# Patient Record
Sex: Female | Born: 1965 | Race: White | Hispanic: No | Marital: Married | Smoking: Former smoker
Health system: Southern US, Community
[De-identification: ages and names within clinical notes are randomized; demographics above are authoritative.]

## PROBLEM LIST (undated history)

## (undated) DIAGNOSIS — Z8679 Personal history of other diseases of the circulatory system: Secondary | ICD-10-CM

## (undated) DIAGNOSIS — M84376A Stress fracture, unspecified foot, initial encounter for fracture: Secondary | ICD-10-CM

## (undated) DIAGNOSIS — R519 Headache, unspecified: Secondary | ICD-10-CM

## (undated) DIAGNOSIS — R112 Nausea with vomiting, unspecified: Secondary | ICD-10-CM

## (undated) DIAGNOSIS — R0989 Other specified symptoms and signs involving the circulatory and respiratory systems: Secondary | ICD-10-CM

## (undated) DIAGNOSIS — R51 Headache: Secondary | ICD-10-CM

## (undated) DIAGNOSIS — K648 Other hemorrhoids: Secondary | ICD-10-CM

## (undated) DIAGNOSIS — J302 Other seasonal allergic rhinitis: Secondary | ICD-10-CM

## (undated) DIAGNOSIS — Z8619 Personal history of other infectious and parasitic diseases: Secondary | ICD-10-CM

## (undated) DIAGNOSIS — Z87448 Personal history of other diseases of urinary system: Secondary | ICD-10-CM

## (undated) DIAGNOSIS — R198 Other specified symptoms and signs involving the digestive system and abdomen: Secondary | ICD-10-CM

## (undated) DIAGNOSIS — D649 Anemia, unspecified: Secondary | ICD-10-CM

## (undated) DIAGNOSIS — Z9889 Other specified postprocedural states: Secondary | ICD-10-CM

## (undated) DIAGNOSIS — I10 Essential (primary) hypertension: Secondary | ICD-10-CM

## (undated) DIAGNOSIS — E039 Hypothyroidism, unspecified: Secondary | ICD-10-CM

## (undated) HISTORY — PX: BREAST SURGERY: SHX581

## (undated) HISTORY — PX: NOVASURE ABLATION: SHX5394

## (undated) HISTORY — DX: Personal history of other infectious and parasitic diseases: Z86.19

## (undated) HISTORY — PX: OTHER SURGICAL HISTORY: SHX169

## (undated) HISTORY — DX: Hypothyroidism, unspecified: E03.9

## (undated) HISTORY — DX: Other specified symptoms and signs involving the digestive system and abdomen: R19.8

## (undated) HISTORY — DX: Personal history of other diseases of urinary system: Z87.448

## (undated) HISTORY — DX: Anemia, unspecified: D64.9

## (undated) HISTORY — DX: Stress fracture, unspecified foot, initial encounter for fracture: M84.376A

## (undated) HISTORY — PX: BREAST EXCISIONAL BIOPSY: SUR124

## (undated) HISTORY — PX: WISDOM TOOTH EXTRACTION: SHX21

## (undated) HISTORY — DX: Other specified symptoms and signs involving the circulatory and respiratory systems: R09.89

## (undated) HISTORY — DX: Other hemorrhoids: K64.8

---

## 1996-03-30 DIAGNOSIS — Z8679 Personal history of other diseases of the circulatory system: Secondary | ICD-10-CM

## 1996-03-30 DIAGNOSIS — I10 Essential (primary) hypertension: Secondary | ICD-10-CM

## 1996-03-30 HISTORY — DX: Personal history of other diseases of the circulatory system: Z86.79

## 1996-03-30 HISTORY — DX: Essential (primary) hypertension: I10

## 1997-09-03 ENCOUNTER — Other Ambulatory Visit: Admission: RE | Admit: 1997-09-03 | Discharge: 1997-09-03 | Payer: Self-pay | Admitting: Obstetrics and Gynecology

## 1998-03-26 ENCOUNTER — Other Ambulatory Visit: Admission: RE | Admit: 1998-03-26 | Discharge: 1998-03-26 | Payer: Self-pay | Admitting: Obstetrics and Gynecology

## 1998-08-30 ENCOUNTER — Other Ambulatory Visit: Admission: RE | Admit: 1998-08-30 | Discharge: 1998-08-30 | Payer: Self-pay | Admitting: Obstetrics and Gynecology

## 1999-03-03 ENCOUNTER — Other Ambulatory Visit: Admission: RE | Admit: 1999-03-03 | Discharge: 1999-03-03 | Payer: Self-pay | Admitting: *Deleted

## 1999-07-19 ENCOUNTER — Inpatient Hospital Stay (HOSPITAL_COMMUNITY): Admission: AD | Admit: 1999-07-19 | Discharge: 1999-07-19 | Payer: Self-pay | Admitting: *Deleted

## 1999-08-14 ENCOUNTER — Observation Stay (HOSPITAL_COMMUNITY): Admission: AD | Admit: 1999-08-14 | Discharge: 1999-08-15 | Payer: Self-pay | Admitting: *Deleted

## 1999-09-18 ENCOUNTER — Inpatient Hospital Stay (HOSPITAL_COMMUNITY): Admission: AD | Admit: 1999-09-18 | Discharge: 1999-09-20 | Payer: Self-pay | Admitting: *Deleted

## 1999-09-25 ENCOUNTER — Encounter: Admission: RE | Admit: 1999-09-25 | Discharge: 1999-11-10 | Payer: Self-pay | Admitting: *Deleted

## 1999-11-21 ENCOUNTER — Encounter: Admission: RE | Admit: 1999-11-21 | Discharge: 2000-02-19 | Payer: Self-pay | Admitting: *Deleted

## 2000-02-21 ENCOUNTER — Encounter: Admission: RE | Admit: 2000-02-21 | Discharge: 2000-04-28 | Payer: Self-pay | Admitting: *Deleted

## 2000-09-10 ENCOUNTER — Other Ambulatory Visit: Admission: RE | Admit: 2000-09-10 | Discharge: 2000-09-10 | Payer: Self-pay | Admitting: Obstetrics and Gynecology

## 2001-04-06 ENCOUNTER — Inpatient Hospital Stay (HOSPITAL_COMMUNITY): Admission: AD | Admit: 2001-04-06 | Discharge: 2001-04-08 | Payer: Self-pay | Admitting: Obstetrics and Gynecology

## 2001-05-13 ENCOUNTER — Other Ambulatory Visit: Admission: RE | Admit: 2001-05-13 | Discharge: 2001-05-13 | Payer: Self-pay | Admitting: Obstetrics and Gynecology

## 2002-05-19 ENCOUNTER — Other Ambulatory Visit: Admission: RE | Admit: 2002-05-19 | Discharge: 2002-05-19 | Payer: Self-pay | Admitting: Obstetrics and Gynecology

## 2003-05-24 ENCOUNTER — Other Ambulatory Visit: Admission: RE | Admit: 2003-05-24 | Discharge: 2003-05-24 | Payer: Self-pay | Admitting: Obstetrics and Gynecology

## 2003-10-31 ENCOUNTER — Ambulatory Visit (HOSPITAL_COMMUNITY): Admission: RE | Admit: 2003-10-31 | Discharge: 2003-10-31 | Payer: Self-pay | Admitting: Obstetrics and Gynecology

## 2005-01-29 ENCOUNTER — Inpatient Hospital Stay (HOSPITAL_COMMUNITY): Admission: AD | Admit: 2005-01-29 | Discharge: 2005-01-31 | Payer: Self-pay | Admitting: Obstetrics and Gynecology

## 2006-04-12 ENCOUNTER — Ambulatory Visit (HOSPITAL_COMMUNITY): Admission: RE | Admit: 2006-04-12 | Discharge: 2006-04-12 | Payer: Self-pay | Admitting: Obstetrics and Gynecology

## 2006-12-13 ENCOUNTER — Ambulatory Visit (HOSPITAL_COMMUNITY): Admission: RE | Admit: 2006-12-13 | Discharge: 2006-12-13 | Payer: Self-pay | Admitting: Obstetrics and Gynecology

## 2007-02-04 ENCOUNTER — Ambulatory Visit: Payer: Self-pay | Admitting: Internal Medicine

## 2007-02-04 DIAGNOSIS — E039 Hypothyroidism, unspecified: Secondary | ICD-10-CM | POA: Insufficient documentation

## 2007-02-04 DIAGNOSIS — D649 Anemia, unspecified: Secondary | ICD-10-CM | POA: Insufficient documentation

## 2007-02-04 DIAGNOSIS — R0989 Other specified symptoms and signs involving the circulatory and respiratory systems: Secondary | ICD-10-CM | POA: Insufficient documentation

## 2007-02-04 DIAGNOSIS — J45990 Exercise induced bronchospasm: Secondary | ICD-10-CM

## 2007-02-11 ENCOUNTER — Encounter: Payer: Self-pay | Admitting: Internal Medicine

## 2007-02-16 ENCOUNTER — Encounter: Payer: Self-pay | Admitting: Internal Medicine

## 2007-03-02 ENCOUNTER — Telehealth: Payer: Self-pay | Admitting: Internal Medicine

## 2007-03-04 ENCOUNTER — Ambulatory Visit: Payer: Self-pay | Admitting: Internal Medicine

## 2007-04-15 ENCOUNTER — Ambulatory Visit (HOSPITAL_COMMUNITY): Admission: RE | Admit: 2007-04-15 | Discharge: 2007-04-15 | Payer: Self-pay | Admitting: Obstetrics and Gynecology

## 2007-05-20 ENCOUNTER — Ambulatory Visit: Payer: Self-pay | Admitting: Internal Medicine

## 2007-06-10 ENCOUNTER — Encounter: Payer: Self-pay | Admitting: Internal Medicine

## 2007-06-10 ENCOUNTER — Ambulatory Visit: Payer: Self-pay

## 2007-06-29 HISTORY — PX: ENDOMETRIAL ABLATION: SHX621

## 2007-08-05 ENCOUNTER — Ambulatory Visit: Payer: Self-pay | Admitting: Internal Medicine

## 2007-08-05 DIAGNOSIS — G4700 Insomnia, unspecified: Secondary | ICD-10-CM | POA: Insufficient documentation

## 2007-11-01 ENCOUNTER — Ambulatory Visit: Payer: Self-pay | Admitting: Internal Medicine

## 2007-11-01 LAB — CONVERTED CEMR LAB: Hemoglobin: 10.2 g/dL

## 2007-11-16 ENCOUNTER — Telehealth: Payer: Self-pay | Admitting: Family Medicine

## 2007-12-27 ENCOUNTER — Telehealth: Payer: Self-pay | Admitting: *Deleted

## 2007-12-29 HISTORY — PX: DILATION AND CURETTAGE OF UTERUS: SHX78

## 2008-01-03 ENCOUNTER — Ambulatory Visit: Payer: Self-pay | Admitting: Internal Medicine

## 2008-02-06 ENCOUNTER — Ambulatory Visit: Payer: Self-pay | Admitting: Internal Medicine

## 2008-02-06 LAB — CONVERTED CEMR LAB
Basophils Relative: 1 % (ref 0.0–3.0)
Eosinophils Absolute: 0.2 10*3/uL (ref 0.0–0.7)
Ferritin: 8.1 ng/mL — ABNORMAL LOW (ref 10.0–291.0)
Hemoglobin: 13.1 g/dL (ref 12.0–15.0)
MCHC: 34.3 g/dL (ref 30.0–36.0)
MCV: 93.8 fL (ref 78.0–100.0)
Monocytes Relative: 4.3 % (ref 3.0–12.0)
Platelets: 197 10*3/uL (ref 150–400)
RDW: 12.9 % (ref 11.5–14.6)

## 2008-02-10 ENCOUNTER — Ambulatory Visit: Payer: Self-pay | Admitting: Internal Medicine

## 2008-03-16 ENCOUNTER — Ambulatory Visit (HOSPITAL_COMMUNITY): Admission: RE | Admit: 2008-03-16 | Discharge: 2008-03-16 | Payer: Self-pay | Admitting: Obstetrics and Gynecology

## 2008-03-16 ENCOUNTER — Encounter (INDEPENDENT_AMBULATORY_CARE_PROVIDER_SITE_OTHER): Payer: Self-pay | Admitting: Obstetrics and Gynecology

## 2008-04-16 ENCOUNTER — Ambulatory Visit (HOSPITAL_COMMUNITY): Admission: RE | Admit: 2008-04-16 | Discharge: 2008-04-16 | Payer: Self-pay | Admitting: Obstetrics and Gynecology

## 2008-04-30 ENCOUNTER — Telehealth (INDEPENDENT_AMBULATORY_CARE_PROVIDER_SITE_OTHER): Payer: Self-pay | Admitting: *Deleted

## 2008-05-07 ENCOUNTER — Ambulatory Visit: Payer: Self-pay | Admitting: Internal Medicine

## 2008-05-08 ENCOUNTER — Telehealth: Payer: Self-pay | Admitting: Internal Medicine

## 2008-07-11 ENCOUNTER — Telehealth: Payer: Self-pay | Admitting: Internal Medicine

## 2008-07-13 ENCOUNTER — Ambulatory Visit: Payer: Self-pay | Admitting: Internal Medicine

## 2008-07-13 LAB — CONVERTED CEMR LAB
Basophils Absolute: 0 10*3/uL (ref 0.0–0.1)
Basophils Relative: 0.3 % (ref 0.0–3.0)
CO2: 29 meq/L (ref 19–32)
Calcium: 9.2 mg/dL (ref 8.4–10.5)
Creatinine, Ser: 1 mg/dL (ref 0.4–1.2)
Eosinophils Absolute: 0.1 10*3/uL (ref 0.0–0.7)
Eosinophils Relative: 1.6 % (ref 0.0–5.0)
Ferritin: 6.2 ng/mL — ABNORMAL LOW (ref 10.0–291.0)
Free T4: 1 ng/dL (ref 0.6–1.6)
HCT: 37.9 % (ref 36.0–46.0)
Lymphs Abs: 1 10*3/uL (ref 0.7–4.0)
MCV: 95.1 fL (ref 78.0–100.0)
Monocytes Absolute: 0.4 10*3/uL (ref 0.1–1.0)
Neutro Abs: 4.8 10*3/uL (ref 1.4–7.7)
Platelets: 221 10*3/uL (ref 150.0–400.0)
RBC: 3.99 M/uL (ref 3.87–5.11)
Vitamin B-12: 410 pg/mL (ref 211–911)

## 2008-07-18 ENCOUNTER — Telehealth: Payer: Self-pay | Admitting: Internal Medicine

## 2008-07-20 ENCOUNTER — Telehealth: Payer: Self-pay | Admitting: Internal Medicine

## 2008-07-20 DIAGNOSIS — K5900 Constipation, unspecified: Secondary | ICD-10-CM | POA: Insufficient documentation

## 2008-07-20 LAB — CONVERTED CEMR LAB: Vit D, 25-Hydroxy: 47 ng/mL (ref 30–89)

## 2008-09-20 ENCOUNTER — Telehealth: Payer: Self-pay | Admitting: Internal Medicine

## 2008-10-09 DIAGNOSIS — Z8679 Personal history of other diseases of the circulatory system: Secondary | ICD-10-CM

## 2008-10-16 ENCOUNTER — Ambulatory Visit: Payer: Self-pay | Admitting: Internal Medicine

## 2008-10-22 ENCOUNTER — Ambulatory Visit (HOSPITAL_COMMUNITY): Admission: RE | Admit: 2008-10-22 | Discharge: 2008-10-22 | Payer: Self-pay | Admitting: Internal Medicine

## 2008-11-26 ENCOUNTER — Telehealth: Payer: Self-pay | Admitting: *Deleted

## 2009-01-02 ENCOUNTER — Ambulatory Visit: Payer: Self-pay | Admitting: Internal Medicine

## 2009-01-02 DIAGNOSIS — Z87891 Personal history of nicotine dependence: Secondary | ICD-10-CM

## 2009-01-02 DIAGNOSIS — D509 Iron deficiency anemia, unspecified: Secondary | ICD-10-CM | POA: Insufficient documentation

## 2009-04-22 ENCOUNTER — Ambulatory Visit (HOSPITAL_COMMUNITY): Admission: RE | Admit: 2009-04-22 | Discharge: 2009-04-22 | Payer: Self-pay | Admitting: Obstetrics and Gynecology

## 2009-11-27 ENCOUNTER — Telehealth: Payer: Self-pay | Admitting: *Deleted

## 2010-01-13 ENCOUNTER — Ambulatory Visit: Payer: Self-pay | Admitting: Internal Medicine

## 2010-01-14 LAB — CONVERTED CEMR LAB
Basophils Relative: 0.5 % (ref 0.0–3.0)
Lymphocytes Relative: 23.7 % (ref 12.0–46.0)
MCHC: 34 g/dL (ref 30.0–36.0)
MCV: 97.3 fL (ref 78.0–100.0)
Monocytes Absolute: 0.4 10*3/uL (ref 0.1–1.0)
Monocytes Relative: 7.2 % (ref 3.0–12.0)
Neutro Abs: 3.9 10*3/uL (ref 1.4–7.7)
RBC: 4.11 M/uL (ref 3.87–5.11)
RDW: 13.8 % (ref 11.5–14.6)

## 2010-02-05 ENCOUNTER — Ambulatory Visit: Payer: Self-pay | Admitting: Sports Medicine

## 2010-02-05 DIAGNOSIS — M25559 Pain in unspecified hip: Secondary | ICD-10-CM

## 2010-02-27 HISTORY — PX: OTHER SURGICAL HISTORY: SHX169

## 2010-03-10 ENCOUNTER — Encounter (INDEPENDENT_AMBULATORY_CARE_PROVIDER_SITE_OTHER): Payer: Self-pay | Admitting: Obstetrics and Gynecology

## 2010-03-10 ENCOUNTER — Ambulatory Visit (HOSPITAL_COMMUNITY)
Admission: RE | Admit: 2010-03-10 | Discharge: 2010-03-10 | Payer: Self-pay | Source: Home / Self Care | Attending: Obstetrics and Gynecology | Admitting: Obstetrics and Gynecology

## 2010-03-11 ENCOUNTER — Encounter (INDEPENDENT_AMBULATORY_CARE_PROVIDER_SITE_OTHER): Payer: Self-pay | Admitting: Obstetrics and Gynecology

## 2010-03-15 ENCOUNTER — Inpatient Hospital Stay (HOSPITAL_COMMUNITY)
Admission: AD | Admit: 2010-03-15 | Discharge: 2010-03-15 | Payer: Self-pay | Source: Home / Self Care | Attending: Obstetrics and Gynecology | Admitting: Obstetrics and Gynecology

## 2010-04-20 ENCOUNTER — Encounter: Payer: Self-pay | Admitting: Obstetrics and Gynecology

## 2010-04-23 ENCOUNTER — Ambulatory Visit (HOSPITAL_COMMUNITY)
Admission: RE | Admit: 2010-04-23 | Discharge: 2010-04-23 | Payer: Self-pay | Source: Home / Self Care | Attending: Obstetrics and Gynecology | Admitting: Obstetrics and Gynecology

## 2010-05-01 NOTE — Assessment & Plan Note (Signed)
Summary: NP RUNNER LEFT HIP PAIN/MJD   Vital Signs:  Patient profile:   45 year old female Menstrual status:  regular Height:      66 inches Weight:      130 pounds BMI:     21.06 Pulse rate:   87 / minute BP sitting:   111 / 68  (right arm)  Vitals Entered By: Rochele Pages RN (February 05, 2010 1:40 PM) CC: left hip pain x 5 months   Referring Provider:  Berniece Andreas, MD Primary Provider:  Berniece Andreas, MD  CC:  left hip pain x 5 months.  History of Present Illness: Patient reports to clinic for evalution of left hip pain laterally   this started in July running and tennis but not specific injury hurt driving to beach now radiates down back of leg at times  no hx of disk injury  no hx of racing or change in training just before onset  stretching and exercises have not helped to date  Preventive Screening-Counseling & Management  Alcohol-Tobacco     Smoking Status: quit > 6 months     Year Quit: 1988  Allergies: 1)  Keflex (Cephalexin)  Social History: Reviewed history from 01/13/2010 and no changes required. 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 Regular exercise-yes tennis  household of 68 , 41-59-60 year old outside cat Alcohol use-yes social Former Smoker   tennis    runner   Smoking Status:  quit > 6 months  Physical Exam  General:  Well-developed,well-nourished,in no acute distress; alert,appropriate and cooperative throughout examination Msk:  Rt and left hip normal ROM rotational Hip flexion strong bilat Hip flexion normal bilat Faber normal left and right, but pain on left Abduction strength good bilat Hip rotation strong bilat Tender over greater trochanter on left Leg lengths equal  Additional Exam:  MSK [place] there is a slight amount of fluid under greater troch bursa on left tendons are visualized piriformis tendon shows neovessels and possible split along distal tendon as it inserts into great troch no  tear or retraction noted   Impression & Recommendations:  Problem # 1:  HIP PAIN, LEFT (ICD-719.45)  This seems like a split in the piriformis Muscle this may be causing some intermittent sciatic compression prob some deg of secondary bursitis but this seems mild  will try injection to speed process  cleanse with alcohol Topical analgesic spray : Ethyl choride Joint LT greater tochanter Approached in typical fashion with: post window; fanned across tendon insertion area Completed without difficulty Meds: 40 mgm kenalog + 4 ccs licoaine 1% Needle: 25 G and 1.5 in Aftercare instructions and Red flags advised   Begin series of stretches and hip exercises p 2 days do this daily next 4 to 6 wks If this responds well cont program If not return for diff TX approach  OK to run and play tennis if no limp  Orders: [place] LIMITED (16109) Trigger Point Injection Single Tendon Origin/Insertion (60454) Kenalog 10 mg inj (J3301)  Complete Medication List: 1)  Ambien 10 Mg Tabs (Zolpidem tartrate) .Marland Kitchen.. 1 by mouth hs for sleep 2)  Synthroid 88 Mcg Tabs (Levothyroxine sodium) .Marland Kitchen.. 1 by mouth once daily 3)  Xanax 0.25 Mg Tabs (Alprazolam) .... One by mouth three times a day prn 4)  Calcium-vitamin D 600-200 Mg-unit Tabs (Calcium-vitamin d) .... One tablet by mouth once daily   Orders Added: 1)  New Patient Level II [99202] 2)  [place] LIMITED [09811]  3)  Trigger Point Injection Single Tendon Origin/Insertion [20551] 4)  Kenalog 10 mg inj [J3301]

## 2010-05-01 NOTE — Assessment & Plan Note (Signed)
Summary: MED CK / REFILL // RS   Vital Signs:  Patient profile:   45 year old female Menstrual status:  regular LMP:     12/24/2009 Height:      65.75 inches Weight:      133 pounds BMI:     21.71 Pulse rate:   66 / minute BP sitting:   110 / 60  (left arm) Cuff size:   regular  Vitals Entered By: Romualdo Bolk, CMA (AAMA) (January 13, 2010 8:22 AM) CC: Follow-up visit on meds LMP (date): 12/24/2009 LMP - Character: light Menarche (age onset years): 14   Menses interval (days): 28 Menstrual flow (days): 1-2 Enter LMP: 12/24/2009   History of Present Illness: Joanne Mccarty comes in today  for  thyroid check yearly. SInce last visit no change in health status.  doing well without injury.  Sleep  no reg medication for now . doing well  THyroid : no change in neck size hair  exercise tolerance  edema etc.     needs thyroid checked .   ON branded meds  Gyne checks normal  Preventive Screening-Counseling & Management  Alcohol-Tobacco     Alcohol drinks/day: 2     Alcohol type: all     Smoking Status: quit     Year Quit: 20 years  Caffeine-Diet-Exercise     Caffeine use/day: 2     Does Patient Exercise: yes  Current Medications (verified): 1)  Ambien 10 Mg  Tabs (Zolpidem Tartrate) .Marland Kitchen.. 1 By Mouth Hs For Sleep 2)  Synthroid 88 Mcg Tabs (Levothyroxine Sodium) .Marland Kitchen.. 1 By Mouth Once Daily 3)  Xanax 0.25 Mg Tabs (Alprazolam) .... One By Mouth Three Times A Day Prn 4)  Calcium-Vitamin D 600-200 Mg-Unit Tabs (Calcium-Vitamin D) .... One Tablet By Mouth Once Daily  Allergies (verified): 1)  Keflex (Cephalexin)  Past History:  Past medical, surgical, family and social histories (including risk factors) reviewed, and no changes noted (except as noted below).  Past Medical History: Reviewed history from 10/16/2008 and no changes required. g3p3 chickenpox Hypothyroidism Anemia-NOS  Abdominal fem bruit  neg doppler exam  CONSULTANTS Balan\par Rivard   Asthma  Past  Surgical History: Reviewed history from 10/16/2008 and no changes required. Left breast bx dysplastic lesion x 3 4/08 endometrial ablation  April   2009 D&C  Past History:  Care Management: Gastroenterology: Juanda Chance Gynecology:Rivard  Family History: Reviewed history from 01/02/2009 and no changes required. Family History High cholesterol father Family History Hypertension mom Family History of Melanoma mom brother with crohns sis a&w No osteoporosis  , neg for celiac  Family History of Diabetes: MGF, Maternal Uncle No FH of Colon Cancer: son now dx with hypothyroid 2011  Social History: Reviewed history from 01/02/2009 and no changes required. 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 gi gu of significance   Physical Exam  General:  Well-developed,well-nourished,in no acute distress; alert,appropriate and cooperative throughout examination Head:  normocephalic and atraumatic.   Eyes:  vision grossly intact.   Neck:  palpable  no  nodules   nontender   Lungs:  Normal respiratory effort, chest expands symmetrically. Lungs are clear to auscultation, no crackles or wheezes. Heart:  Normal rate and regular rhythm. S1  and S2 normal without gallop, murmur, click, rub or other extra sounds. Abdomen:  Bowel sounds positive,abdomen soft and non-tender without masses, organomegaly or hernias noted.  faint bruit systolic left femoral area  no change  Pulses:  pulses intact without delay   Extremities:  no clubbing cyanosis or edema  Neurologic:  non focal grossly  Skin:  turgor normal, color normal, no ecchymoses, and no petechiae.   Cervical Nodes:  No lymphadenopathy noted Psych:  Oriented X3, good eye contact, not anxious appearing, and not depressed appearing.      Impression & Recommendations:  Problem # 1:  HYPOTHYROIDISM (ICD-244.9)  Her updated medication list for this problem includes:    Synthroid 88 Mcg Tabs (Levothyroxine sodium) .Marland Kitchen... 1 by mouth once daily  Labs Reviewed: TSH: 1.94 (01/02/2009)     Orders: Venipuncture (11914) Specimen Handling (78295) TLB-TSH (Thyroid Stimulating Hormone) (84443-TSH) TLB-CBC Platelet - w/Differential (85025-CBCD)  Problem # 2:  FEMORAL BRUIT (ICD-785.9) no change    eval in past with nl flow  prob heard  from  thin abd wall  Complete Medication List: 1)  Ambien 10 Mg Tabs (Zolpidem tartrate) .Marland Kitchen.. 1 by mouth hs for sleep 2)  Synthroid 88 Mcg Tabs (Levothyroxine sodium) .Marland Kitchen.. 1 by mouth once daily 3)  Xanax 0.25 Mg Tabs (Alprazolam) .... One by mouth three times a day prn 4)  Calcium-vitamin D 600-200 Mg-unit Tabs (Calcium-vitamin d) .... One tablet by mouth once daily  Other Orders: Admin 1st Vaccine (62130) Flu Vaccine 15yrs + (86578)  Patient Instructions: 1)  You will be informed of lab results when available.   2)  check yearly as needed  Prescriptions: SYNTHROID 88 MCG TABS (LEVOTHYROXINE SODIUM) 1 by mouth once daily Brand medically necessary #30 x 12   Entered and Authorized by:   Madelin Headings MD   Signed by:   Madelin Headings MD on 01/13/2010   Method used:   Electronically to        [facility]. [address]. [phone]* (retail)       [address]       [place], [place]  [address]       Ph: [phone] or [phone]       Fax: [phone]   RxID:   [id number]    Orders Added: 1)  Admin 1st Vaccine [90471] 2)  Flu Vaccine 46yrs + [63016] 3)  Venipuncture [01093] 4)  Specimen Handling [99000] 5)  TLB-TSH (Thyroid Stimulating Hormone) [84443-TSH] 6)  TLB-CBC Platelet - w/Differential [85025-CBCD] 7)  Est. Patient Level III [23557] Flu Vaccine Consent Questions     Do you have a history of severe allergic reactions to this vaccine? no    Any prior  history of allergic reactions to egg and/or gelatin? no    Do you have a sensitivity to the preservative Thimersol? no    Do you have a past history of Guillan-Barre Syndrome? no    Do you currently have an acute febrile illness? no    Have you ever had a severe reaction to latex? no    Vaccine information given and explained to patient? yes    Are you currently pregnant? no    Lot Number:[id number]   Exp Date:09/27/2010   Site Given  Left Deltoid IM Romualdo Bolk, CMA (AAMA)  January 13, 2010 8:24 AM         .lbflu

## 2010-05-01 NOTE — Progress Notes (Signed)
Summary: diflucan request  Phone Note Call from Patient Call back at Work Phone [phone]   Summary of Call: Yeast infection, mostly itching & irritation.  No recent antibiotics, but out of "blue".  Going out of town.  Request med. Diflucan, not cream please.  [facility].  NKDA. Initial call taken by: Rudy Jew, RN,  November 27, 2009 2:18 PM  Follow-up for Phone Call        ok  x 1 diflucan 150  Follow-up by: Madelin Headings MD,  November 27, 2009 5:38 PM  Additional Follow-up for Phone Call Additional follow up Details #1::        rx sent to pharmacy and pt aware. Additional Follow-up by: Romualdo Bolk, CMA (AAMA),  November 27, 2009 5:42 PM    New/Updated Medications: DIFLUCAN 150 MG TABS (FLUCONAZOLE) 1 by mouth as a single dose Prescriptions: DIFLUCAN 150 MG TABS (FLUCONAZOLE) 1 by mouth as a single dose  #1 x 0   Entered by:   Romualdo Bolk, CMA (AAMA)   Authorized by:   Madelin Headings MD   Signed by:   Romualdo Bolk, CMA (AAMA) on 11/27/2009   Method used:   Electronically to        [facility]. [address] [phone]* (retail)       [address]       [place], [place]  [address]       Ph: [phone] or [phone]       Fax: [phone]   RxID:   [id number]

## 2010-06-04 ENCOUNTER — Other Ambulatory Visit: Payer: Self-pay | Admitting: Dermatology

## 2010-06-09 ENCOUNTER — Encounter: Payer: Self-pay | Admitting: *Deleted

## 2010-06-09 LAB — URINALYSIS, ROUTINE W REFLEX MICROSCOPIC
Bilirubin Urine: NEGATIVE
Ketones, ur: NEGATIVE mg/dL
Leukocytes, UA: NEGATIVE
Protein, ur: NEGATIVE mg/dL
pH: 5.5 (ref 5.0–8.0)

## 2010-06-09 LAB — BASIC METABOLIC PANEL
BUN: 11 mg/dL (ref 6–23)
CO2: 27 mEq/L (ref 19–32)
Calcium: 8.7 mg/dL (ref 8.4–10.5)
Chloride: 104 mEq/L (ref 96–112)
GFR calc Af Amer: >60 mL/min (ref >60)
GFR calc non Af Amer: >60 mL/min (ref >60)
Glucose, Bld: 126 mg/dL — ABNORMAL HIGH (ref 70–99)
Sodium: 138 mEq/L (ref 135–145)

## 2010-06-09 LAB — CBC
HCT: 38.2 % (ref 36.0–46.0)
Hemoglobin: 12.3 g/dL (ref 12.0–15.0)
Hemoglobin: 13.1 g/dL (ref 12.0–15.0)
MCH: 31.2 pg (ref 26.0–34.0)
MCHC: 33.2 g/dL (ref 30.0–36.0)
MCV: 96.9 fL (ref 78.0–100.0)
Platelets: 192 10*3/uL (ref 150–400)
WBC: 5.2 10*3/uL (ref 4.0–10.5)
WBC: 5.5 10*3/uL (ref 4.0–10.5)

## 2010-06-09 LAB — URINE CULTURE
Colony Count: NO GROWTH
Culture  Setup Time: 201112172020

## 2010-06-09 LAB — DIFFERENTIAL
Lymphocytes Relative: 6 % — ABNORMAL LOW (ref 12–46)
Neutro Abs: 4.6 10*3/uL (ref 1.7–7.7)
Neutrophils Relative %: 88 % — ABNORMAL HIGH (ref 43–77)

## 2010-06-09 LAB — SURGICAL PCR SCREEN: Staphylococcus aureus: NEGATIVE

## 2010-06-12 ENCOUNTER — Encounter: Payer: Self-pay | Admitting: Internal Medicine

## 2010-06-16 ENCOUNTER — Ambulatory Visit (INDEPENDENT_AMBULATORY_CARE_PROVIDER_SITE_OTHER): Payer: PRIVATE HEALTH INSURANCE | Admitting: Internal Medicine

## 2010-06-16 ENCOUNTER — Encounter: Payer: Self-pay | Admitting: Internal Medicine

## 2010-06-16 VITALS — BP 100/60 | HR 78 | Temp 98.6°F | Wt 132.0 lb

## 2010-06-16 DIAGNOSIS — E039 Hypothyroidism, unspecified: Secondary | ICD-10-CM

## 2010-06-16 DIAGNOSIS — R5381 Other malaise: Secondary | ICD-10-CM

## 2010-06-16 DIAGNOSIS — N643 Galactorrhea not associated with childbirth: Secondary | ICD-10-CM

## 2010-06-16 DIAGNOSIS — R5383 Other fatigue: Secondary | ICD-10-CM

## 2010-06-16 DIAGNOSIS — N6452 Nipple discharge: Secondary | ICD-10-CM | POA: Insufficient documentation

## 2010-06-16 DIAGNOSIS — N6459 Other signs and symptoms in breast: Secondary | ICD-10-CM

## 2010-06-16 NOTE — Patient Instructions (Signed)
Lab this week in am  Will notify you  of labs when available.

## 2010-06-17 ENCOUNTER — Other Ambulatory Visit (INDEPENDENT_AMBULATORY_CARE_PROVIDER_SITE_OTHER): Payer: PRIVATE HEALTH INSURANCE | Admitting: Internal Medicine

## 2010-06-17 DIAGNOSIS — N6452 Nipple discharge: Secondary | ICD-10-CM

## 2010-06-17 DIAGNOSIS — E039 Hypothyroidism, unspecified: Secondary | ICD-10-CM

## 2010-06-17 DIAGNOSIS — E236 Other disorders of pituitary gland: Secondary | ICD-10-CM

## 2010-06-17 DIAGNOSIS — N6459 Other signs and symptoms in breast: Secondary | ICD-10-CM

## 2010-06-17 DIAGNOSIS — N643 Galactorrhea not associated with childbirth: Secondary | ICD-10-CM

## 2010-06-17 DIAGNOSIS — O2690 Pregnancy related conditions, unspecified, unspecified trimester: Secondary | ICD-10-CM

## 2010-06-17 LAB — TSH: TSH: 1.38 u[IU]/mL (ref 0.35–5.50)

## 2010-06-17 LAB — T4, FREE: Free T4: 1.04 ng/dL (ref 0.60–1.60)

## 2010-06-17 LAB — PROLACTIN: Prolactin: 7.8 ng/mL

## 2010-06-21 ENCOUNTER — Encounter: Payer: Self-pay | Admitting: Internal Medicine

## 2010-06-21 DIAGNOSIS — N643 Galactorrhea not associated with childbirth: Secondary | ICD-10-CM | POA: Insufficient documentation

## 2010-06-21 NOTE — Progress Notes (Signed)
  Subjective:    Patient ID: Joanne Mccarty, female    DOB: January 14, 1966, 45 y.o.   MRN: [id number]  HPI Patient comes in today for anew problem .Since January has noted bilatera small amt of breast discharge first noted during mammogram with breat pressure. Since then has noted ocass stain in bra . No blood green few drops if expressed. No new has visual field changes  . But getting over al breast tenderenss and fullness at times ? Cyclic .  ( had endometrial ablation)  Wonders if thyroid is off. Taking meds regulary.  More tired than usual.   Is a runner and gets tired after few miles  Over the last months but no cp sob and asthma stable.  Past Medical History  Diagnosis Date  . History of chickenpox   . Abdominal bruit     fem neg doppler exam  . Anemia   . Asthma   . Hypothyroid    Past Surgical History  Procedure Date  . Dysplastic lesion      x 3 4/08  . Endometrial ablation april 2009  . Dilation and curettage of uterus   . Breast surgery     left bx    reports that she has quit smoking. She does not have any smokeless tobacco history on file. She reports that she drinks alcohol. She reports that she does not use illicit drugs. family history includes Crohn's disease in her brother; Hypertension in her mother; Hypothyroidism in her son; and Melanoma in her mother. Allergies  Allergen Reactions  . Cephalexin     REACTION: rash     Review of Systems No fever  Breast lump other meds . No blood rest of ros no change or as per hpi husnband had vascectomy     Objective:   Physical Exam WDWN in nad  HEENT grossly normal  eoms full  NEck no masses or bruit Breast: normal by inspection . No dimpling, discharge, masses, tenderness .   On manipulation one drop expressed left whit and right green discharge .  Axilla clear  Abdomen:  Sof,t normal bowel sounds without hepatosplenomegaly, no guarding rebound or masses no CVA tenderness Neuro: grossly nl   No visual field cut by  confrontation..        Assessment & Plan:  Galactorrhea bil with feeling of breast fullness   R/o hormonal  Dysfunction  ? If Remus Loffler can do this  Some fatigue  ? Vague  May not be significant .   Hypothyroid   Check tsh  PL hcg etc.   .

## 2010-06-23 ENCOUNTER — Telehealth: Payer: Self-pay | Admitting: *Deleted

## 2010-06-23 NOTE — Telephone Encounter (Signed)
Pt would like lab results.  

## 2010-06-23 NOTE — Telephone Encounter (Signed)
Left message on machine about results. 

## 2010-06-23 NOTE — Telephone Encounter (Signed)
Patient didn't want HCG blood test redrawn

## 2010-08-12 NOTE — Op Note (Signed)
NAME:  Joanne Mccarty, Joanne Mccarty                  ACCOUNT NO.:  [id number]   MEDICAL RECORD NO.:  [id number]          PATIENT TYPE:  AMB   LOCATION:  SDC                           FACILITY:  [facility]   PHYSICIAN:  Dois Davenport A. Rivard, M.D. DATE OF BIRTH:  03-24-1966   DATE OF PROCEDURE:  03/16/2008  DATE OF DISCHARGE:                               OPERATIVE REPORT   PREOPERATIVE DIAGNOSIS:  Dysfunctional uterine bleeding.   POSTOPERATIVE DIAGNOSES:  Dysfunctional uterine bleeding with  endometrial polyp.   ANESTHESIA:  General, Dr. Jean Rosenthal.   PROCEDURE:  Hysteroscopy and dilation and curettage.   SURGEON:  Crist Fat. Rivard, MD   ASSISTANT:  None.   ESTIMATED BLOOD LOSS:  Minimal.   PROCEDURE IN DETAILED:  After being informed of the planned procedure  with possible complications including bleeding, infection and injury to  uterus, informed consent was obtained.  The patient was taken to OR #3,  given general anesthesia with laryngeal mask without complication.  She  was placed in a lithotomy position, prepped and draped in a sterile  fashion, and her bladder was emptied with an in-and-out red rubber  catheter.  Pelvic exam revealed a slightly retroverted uterus, normal in  size and shape, 2 normal adnexa.  A weighted speculum was inserted.  Anterior lip of the cervix was grasped with a tenaculum forceps, and we  proceeded with a paracervical block using Novocain 1% 20 mL in the usual  fashion.  Cervical length was measured at 3 cm.  Uterus was sounded at  7.5 cm.  Cervix was easily dilated using Hegar dilator until #31, which  allowed easy entry of a diagnostic hysteroscope.  With perfusion of  mannitol at a maximum pressure of 80 mmHg initially, raised to 100 mmHg  at the end of the procedure, we were able to visualize what was left of  the endometrial cavity, this patient being status post endometrial  ablation with NovaSure 8 months ago.   OBSERVATION:  The right half of the uterus was  completely obliterated  and we were unable to access it.  This was due to intrauterine adhesions  due to NovaSure.  We on the other hand, able to access the left side of  the uterus and we were eventually able to see the left tubal ostia and,  we noted the presence of 2 polyps.  There was also a thick fluffy-  appearing endometrium on that side.  The diagnostic hysteroscope was  changed for the operative hysteroscope, but we were unable to enter the  left-sided uterine cavity due to the small canal leading to it despite  multiple attempts.  We removed that and using a sharp curette, we  proceeded with multiple passes of the sharp curette until we felt we had  removed the polyps and most of the endometrial lining.  The diagnostic  hysteroscope was returned in the left hemicavity and we do confirmed  that all polyps had been removed.  Due to the small size of the cavity,  we were unable to proceed with our plan of a repeat NovaSure endometrial  ablation and we ended the procedure.  Instruments were removed and we  noted a cervical laceration which was repaired with a running lock  suture of 3-0 Vicryl.   Instruments and sponge count was complete x2.  Estimated blood loss was  minimal.  Fluid deficit was 235 mL.  The procedure was very well  tolerated by the patient, who was taken to recovery room and discharged  to home in a well and stable condition.      Crist Fat Rivard, M.D.  Electronically Signed     SAR/MEDQ  D:  03/16/2008  T:  03/17/2008  Job:  [id number]

## 2010-08-15 NOTE — Discharge Summary (Signed)
[facility]  Patient:    Joanne Mccarty, Joanne Mccarty                         MRN: [id number] Adm. Date:  [id number] Disc. Date: [id number] Attending:  Ardeen Fillers                           Discharge Summary  DISCHARGE DIAGNOSES:          1. Intrauterine pregnancy at [redacted] weeks                                  gestational age, delivered.                               2. Rh positive.                               3. History of preterm labor.                               4. Postpartum anemia.  OPERATIVE PROCEDURE:          1. Spontaneous vaginal delivery.                               2. Repair of second-degree right labia                                  minora laceration on September 18, 1999.  HOSPITAL COURSE:              45 year-old woman, g 1, p 0, EDC October 01, 1999, admitted in active labor on September 18, 1999.  She had had no ruptured membranes or bleeding.  The antenatal course had been remarkable for preterm labor.  ______  and bed rest were discontinued at [redacted] weeks gestational age. She was admitted with a cervix of 5 cm.  Labor progressed well.  Amniotomy was performed for clear fluid.  Epidural anesthesia was given.  She was delivered spontaneously of a live female, 6 pounds 7 ounces, Apgars 9 and 9, over an intact perineum after one hour and nine minutes second stage.  Second-degree right labia minora laceration with a repair without difficulty.                                Postpartum course was unremarkable.  She chose to breast feed.  Hemoglobin stabilized at 10.2.  This was well tolerated and managed conservatively.                                She was discharged to home on the second postpartum day in satisfactory condition.  She was given routine status post vaginal delivery instructions.  DISCHARGE MEDICATIONS:        1. Prenatal vitamins.  2. Nonsteroidal anti-inflammatory medication. DD:  10/29/99 TD:   10/30/99 Job: [id number]

## 2010-08-15 NOTE — Discharge Summary (Signed)
[facility]  Patient:    Joanne Mccarty, Joanne Mccarty                         MRN: [id number] Adm. Date:  [id number] Disc. Date: [id number] Attending:  Ardeen Fillers                           Discharge Summary  DISCHARGE DIAGNOSES:          1. Intrauterine pregnancy at 33+ weeks gestational                                  age, discharged undelivered.                               2. Recurrent preterm labor.  HISTORY OF PRESENT ILLNESS:   A 45 year old woman, gravida 1, para 0, EDC July , 2001, admitted at 33+ weeks gestational age for management of preterm labor with magnesium sulfate after failure of oral tocolytics.  Preterm labor presented at [redacted] weeks gestational age.  She was treated with procardia and modified bed rest. Medication was changed to Terbutaline soon thereafter.  The patient has continued to work six hours daily and is resting otherwise.  She was evaluated in the office by Sheronette A. Cousins, M.D. on the date of admission and was found to have cervical dilation to 1 cm which was a change from the previous examination.  Uterine contractions every six to eight minutes were  identified in the office on monitoring.  She denied vaginal bleeding, rupture of membranes, or dysuria, and reported good fetal activity.  HOSPITAL COURSE:              She was admitted to [facility]. Magnesium sulfate was given IV.  Preterm labor resolved.  The magnesium was tapered on the following day and Terbutaline subcutaneous pump was initiated by [place].  Betamethasone was given both on May 17 and May 18 to enhance fetal lung maturity. She was discharged to home in satisfactory condition.  She will be followed up y Sung Amabile. Roslyn Smiling, M.D. within the week.  DISCHARGE MEDICATIONS:        1. Prenatal vitamins.                               2. Subcutaneous Terbutaline.  Modified bed rest will be continued. DD:  09/08/99 TD:   09/09/99 Job: [id number]

## 2010-08-15 NOTE — H&P (Signed)
[facility]  Patient:    Joanne Mccarty, Joanne Mccarty                         MRN: [id number] Adm. Date:  [id number] Attending:  Ardeen Fillers CC:         Sung Amabile. Roslyn Smiling, M.D.                         History and Physical  HISTORY OF PRESENT ILLNESS:       A 45 year old woman, G1, P0, [facility] October 01, 1999, admitted in active labor early on the morning of September 18, 1999.  No rupture of membranes or bleeding.  ANTENATAL COURSE:                 Remarkable for preterm labor which has been managed with various tocolytics and modified bed rest until [redacted] weeks gestational age.  PAST MEDICAL HISTORY:  MEDICAL:                          History of hypertension, no problems during pregnancy.  Asthma.  SURGERIES:                        None.  ALLERGIES:                        None.  MEDICATIONS:                      Prenatal vitamins.  FAMILY HISTORY:                   Maternal grandfather with adult onset diabetes mellitus, mother with hypertension.  SOCIAL HISTORY:                   Married.  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  Denies tobacco or ethanol use.  PHYSICAL EXAMINATION:  GENERAL:                          Laboring, gravid woman.  VITAL SIGNS:                      Afebrile. Vital signs stable.  HEENT:                            Within normal limits.  NECK:                             Without thyromegaly.  CHEST:                            Clear.  COR:                              Regular rate and rhythm.  S1, S2 normal.  ABDOMEN:                          Soft, nontender.  Estimated fetal weight 7 pounds, vertex presentation.  GU:  Cervix 5 complete and -1, bulging membranes.  EXTREMITIES:                      Without clubbing, cyanosis, or edema.  NEUROLOGIC:                        DTRs 2+ without clonus.  LABORATORY DATA:                  Antenatal labs: O Positive, RPR nonreactive, toxoplasmosis  negative, rubella immune, hepatitis B surface antigen negative, HIV negative June 2000.  AFP within normal limits.  One-hour glucola normal. Group B strep negative.                                    Monitoring showed uterine contractions every 2 to 3 minutes with reactive tracing.  IMPRESSION:                                   1. Intrauterine pregnancy at 37+ weeks                                      gestational age, in active labor.                                   2. Rh positive.  PLAN:                             Amniotomy performed for clear fluid.  IV initiated.  Epidural anesthesia offered. DD:  09/18/99 TD:  09/18/99 Job: [id number]

## 2011-01-01 LAB — PREGNANCY, URINE: Preg Test, Ur: NEGATIVE

## 2011-01-01 LAB — CBC
Hemoglobin: 13.5 g/dL (ref 12.0–15.0)
RBC: 4.19 MIL/uL (ref 3.87–5.11)
WBC: 4.2 10*3/uL (ref 4.0–10.5)

## 2011-01-09 ENCOUNTER — Encounter: Payer: Self-pay | Admitting: *Deleted

## 2011-01-09 ENCOUNTER — Encounter: Payer: Self-pay | Admitting: Internal Medicine

## 2011-01-09 ENCOUNTER — Ambulatory Visit (INDEPENDENT_AMBULATORY_CARE_PROVIDER_SITE_OTHER): Payer: PRIVATE HEALTH INSURANCE | Admitting: Internal Medicine

## 2011-01-09 VITALS — BP 120/60 | HR 78 | Ht 66.0 in | Wt 136.0 lb

## 2011-01-09 DIAGNOSIS — Z23 Encounter for immunization: Secondary | ICD-10-CM

## 2011-01-09 DIAGNOSIS — E039 Hypothyroidism, unspecified: Secondary | ICD-10-CM

## 2011-01-09 LAB — TSH: TSH: 1.45 u[IU]/mL (ref 0.35–5.50)

## 2011-01-09 MED ORDER — SYNTHROID 88 MCG PO TABS: 88.0000 ug | ORAL_TABLET | Freq: Every day | ORAL | Status: DC

## 2011-01-09 NOTE — Assessment & Plan Note (Signed)
Doing well on brand med.

## 2011-01-09 NOTE — Progress Notes (Signed)
  Subjective:    Patient ID: Joanne Mccarty, female    DOB: 10-23-1965, 45 y.o.   MRN: [id number]  HPI Comes in for yearly check for thyroid management . Since last visit ding well without major changes . No change hair   Gained 3 pounds.   Taking synthroid  Brand  Every day. Continues to exercise without sx of limitations otherwiseReview of Systems ROS:  GEN/ HEENTNo fever, significant weight changes sweats headaches vision problems hearing changes, CV/ PULM; No chest pain shortness of breath cough, syncope,edema  change in exercise tolerance. GI /GU: No adominal pain, vomiting, change in bowel habits. No blood in the stool. No significant GU symptoms. SKIN/HEME: ,no acute skin rashes suspicious lesions or bleeding. No lymphadenopathy, nodules, masses.  NEURO/ PSYCH:  No neurologic signs such as weakness numbness No depression anxiety. IMM/ Allergy: No unusual infections.  Allergy .   REST of 12 system review negative ;remote hx of anemia   Sleep is interrupted  45 yo with nightmares otherwise ok.  Past history family history social history reviewed in the electronic medical record.     Objective:   Physical Exam WDWN in nad  HEENT: grossly normal Neck supple thyroid smooth nontender and no nodule felt. No adenopathy Chest:  Clear to A&P without wheezes rales or rhonchi CV:  S1-S2 no gallops or murmurs peripheral perfusion is normal Abdomen:  Sof,t normal bowel sounds without hepatosplenomegaly, no guarding rebound or masses no CVA tenderness Neuro grossly non focal Skin no acute changes  No clubbing cyanosis or edema     Assessment & Plan:  THyroid management No evidence of other endocrine disease Total visit > 50% spent counseling and coordinating care     Sleep situational. Issues   Recheck prn.

## 2011-01-09 NOTE — Patient Instructions (Signed)
Will notify you  of labs when available.  Continue lifestyle intervention healthy eating and exercise .  

## 2011-01-19 ENCOUNTER — Ambulatory Visit: Payer: PRIVATE HEALTH INSURANCE | Admitting: Internal Medicine

## 2011-01-23 ENCOUNTER — Other Ambulatory Visit: Payer: Self-pay | Admitting: Internal Medicine

## 2011-03-31 DIAGNOSIS — Z87448 Personal history of other diseases of urinary system: Secondary | ICD-10-CM

## 2011-03-31 HISTORY — DX: Personal history of other diseases of urinary system: Z87.448

## 2011-04-03 ENCOUNTER — Other Ambulatory Visit (HOSPITAL_COMMUNITY): Payer: Self-pay | Admitting: Obstetrics and Gynecology

## 2011-04-03 DIAGNOSIS — Z1231 Encounter for screening mammogram for malignant neoplasm of breast: Secondary | ICD-10-CM

## 2011-05-01 ENCOUNTER — Ambulatory Visit (HOSPITAL_COMMUNITY)
Admission: RE | Admit: 2011-05-01 | Discharge: 2011-05-01 | Disposition: A | Discharge status: Home / Self Care | Payer: PRIVATE HEALTH INSURANCE | Source: Ambulatory Visit | Attending: Obstetrics and Gynecology | Admitting: Obstetrics and Gynecology

## 2011-05-01 DIAGNOSIS — Z1231 Encounter for screening mammogram for malignant neoplasm of breast: Secondary | ICD-10-CM | POA: Insufficient documentation

## 2011-05-21 ENCOUNTER — Other Ambulatory Visit: Payer: Self-pay | Admitting: Internal Medicine

## 2011-05-21 MED ORDER — SYNTHROID 88 MCG PO TABS: 88.0000 ug | ORAL_TABLET | Freq: Every day | ORAL | Status: DC

## 2011-05-21 NOTE — Telephone Encounter (Signed)
Rx sent to pharmacy   

## 2011-05-21 NOTE — Telephone Encounter (Signed)
Pt need new rx synthroid 88 mcg #90 with 3 refills fax into [facility] [phone] member ID [id number]

## 2011-06-04 ENCOUNTER — Encounter: Payer: Self-pay | Admitting: Internal Medicine

## 2011-06-04 ENCOUNTER — Ambulatory Visit (INDEPENDENT_AMBULATORY_CARE_PROVIDER_SITE_OTHER): Payer: PRIVATE HEALTH INSURANCE | Admitting: Internal Medicine

## 2011-06-04 VITALS — BP 120/80 | HR 88 | Wt 138.0 lb

## 2011-06-04 DIAGNOSIS — R198 Other specified symptoms and signs involving the digestive system and abdomen: Secondary | ICD-10-CM

## 2011-06-04 DIAGNOSIS — N816 Rectocele: Secondary | ICD-10-CM | POA: Insufficient documentation

## 2011-06-04 HISTORY — DX: Other specified symptoms and signs involving the digestive system and abdomen: R19.8

## 2011-06-04 NOTE — Patient Instructions (Signed)
Monitor  If  persistent or progressive consider other check  No evidence of rectal mass . Poss from rectocele.

## 2011-06-04 NOTE — Progress Notes (Signed)
  Subjective:    Patient ID: Joanne Mccarty, female    DOB: [birth year], 46 y.o.   MRN: [id number]  HPI Patient comes in today or  new problem evaluation. Ongoing for 3 month or so.  Running for years    And now rectal pressure  Hen running and no other time . No real pain ,r  Treated for hemorrhoids . But no change . Sx no other times at all . No change in body functions back pain or  Radiation. Has a rectocele.  Per [facility]  Dr Estanislado Pandy .  Bleeding after  Running for years.   As in the past.    3 -4 days per week  And  3- 10 miles.  Review of Systems eg abd pain weight loss  Change in bowel habits.  Vomiting   UI .   Past history family history social history reviewed in the electronic medical record.  Outpatient Encounter Prescriptions as of 06/04/2011  Medication Sig Dispense Refill  . ALPRAZolam (XANAX) 0.25 MG tablet Take 0.25 mg by mouth 3 (three) times daily as needed.        . norethindrone-ethinyl estradiol (LOESTRIN FE 1/20) 1-20 MG-MCG tablet Take 1 tablet by mouth daily.      Marland Kitchen SYNTHROID 88 MCG tablet Take 1 tablet (88 mcg total) by mouth daily.  90 tablet  3  . DISCONTD: Calcium Carbonate-Vitamin D (CALCIUM-VITAMIN D) 600-200 MG-UNIT CAPS Take 1 tablet by mouth daily.        Marland Kitchen DISCONTD: zolpidem (AMBIEN) 10 MG tablet Take 10 mg by mouth at bedtime. For sleep             Objective:   Physical Exam WDWN in nad  Abdomen:  Sof,t normal bowel sounds without hepatosplenomegaly, no guarding rebound or masses no CVA tenderness Pelvic: NL ext GU, labia clear without lesions or rash . Vagina no lesions .Cervix: clear  UTERUS: Neg CMT Adnexa:  clear no masses .   Rectocele  Noted  On straining  .  Rectal vaginal exam hemorrhoid tag.   No bleeding  No masses or tenderness noted.       Assessment & Plan:  Rectal pressure when running  Poss from rectocele no masses noted  If  progressive may want to see GI or her gyne about this  . But no alarming findings on her exam today.

## 2011-06-12 ENCOUNTER — Telehealth: Payer: Self-pay | Admitting: Internal Medicine

## 2011-06-12 NOTE — Telephone Encounter (Signed)
Patient calling to ask Dr. Juanda Chance about her rectal bleeding after running. States she has had stomach cramping/rectal bleeding after strenuous runs but she is noticing she is having stomach cramps, loose stools with bright red, blood in stool and when she wipes after running 6 miles or less. She wants to know if this is something runners have problems with. Please, advise.

## 2011-06-12 NOTE — Telephone Encounter (Signed)
I have spoken to the pt. I have called in Anusol HC supp 1 qhs, 2 refills to [facility] [phone]. Please call her with appointment 4-6 weeks.

## 2011-06-15 NOTE — Telephone Encounter (Signed)
Scheduled patient on 07/21/11 at 10:15 AM. Patient aware.

## 2011-06-15 NOTE — Telephone Encounter (Signed)
Left a message for patient to call and schedule OV.

## 2011-07-10 ENCOUNTER — Encounter: Payer: Self-pay | Admitting: *Deleted

## 2011-07-21 ENCOUNTER — Ambulatory Visit: Payer: PRIVATE HEALTH INSURANCE | Admitting: Internal Medicine

## 2011-07-22 ENCOUNTER — Ambulatory Visit (INDEPENDENT_AMBULATORY_CARE_PROVIDER_SITE_OTHER): Payer: PRIVATE HEALTH INSURANCE | Admitting: Sports Medicine

## 2011-07-22 VITALS — BP 118/74

## 2011-07-22 DIAGNOSIS — M84376A Stress fracture, unspecified foot, initial encounter for fracture: Secondary | ICD-10-CM

## 2011-07-22 DIAGNOSIS — M79671 Pain in right foot: Secondary | ICD-10-CM | POA: Insufficient documentation

## 2011-07-22 DIAGNOSIS — M79609 Pain in unspecified limb: Secondary | ICD-10-CM

## 2011-07-22 HISTORY — DX: Stress fracture, unspecified foot, initial encounter for fracture: M84.376A

## 2011-07-22 NOTE — Patient Instructions (Signed)
Please use arch strap and post op shoe when you are up on feet  It is ok to cross train on the bike as long as this does not cause pain  Take calcium and vitamin D Vitamin C 500 mg is also good  Ice foot 1-2 times daily  Please do not run or play tennis for the next 2 weeks  Please follow up in 2 weeks  Thank you for seeing [place] today!

## 2011-07-22 NOTE — Progress Notes (Signed)
  Subjective:    Patient ID: Joanne Mccarty, female    DOB: 03/28/1966, 46 y.o.   MRN: [id number]  HPI  Pt presents to clinic for evaluation of rt foot pain x 1 week.  Pain is in 2-3 MTs dorsal and plantar surfaces. Started after playing 3 hour tennis match on hard court.  Runs 20 mpw and plays tennis several times per week.   Now limping with walking Not able to run last few days 2/2 pain More swelling by end of day  Review of Systems     Objective:   Physical Exam NAD Rt foot exam: Abnormal callusing over 3rd MTP and slightly over 4th, swelling over fat pad distally Dorsal swelling and loss of definition TTP 3rd MT distal shaft Some TTP 4th MT distal shaft but less Tender on plantar fat pad below 3rd MTP  MSK [place] Longitudinal view reveals hypoechoic change along 3rd MT shaft distally Some increase fluid in #rd MTP Increase doppler flow Trans view shows a cap sign on 3rd MT not on 2nd or 4th Minimal swelling over 4th MT but no increase in doppler       Assessment & Plan:

## 2011-07-22 NOTE — Assessment & Plan Note (Signed)
Ice Arch strap Post op shoe Limit weight bearing  OTC meds

## 2011-07-22 NOTE — Assessment & Plan Note (Signed)
Suggest cross training  Biking or water OK  Get Ca/ Vit D and some Vit C  Reck and repeat scan in 2 weeks to see if healing

## 2011-07-28 ENCOUNTER — Ambulatory Visit: Payer: PRIVATE HEALTH INSURANCE | Admitting: Sports Medicine

## 2011-08-06 ENCOUNTER — Ambulatory Visit (INDEPENDENT_AMBULATORY_CARE_PROVIDER_SITE_OTHER): Payer: PRIVATE HEALTH INSURANCE | Admitting: Sports Medicine

## 2011-08-06 ENCOUNTER — Encounter: Payer: Self-pay | Admitting: Sports Medicine

## 2011-08-06 VITALS — BP 113/69 | HR 60

## 2011-08-06 DIAGNOSIS — M79609 Pain in unspecified limb: Secondary | ICD-10-CM

## 2011-08-06 DIAGNOSIS — M84376A Stress fracture, unspecified foot, initial encounter for fracture: Secondary | ICD-10-CM

## 2011-08-06 DIAGNOSIS — M79671 Pain in right foot: Secondary | ICD-10-CM

## 2011-08-06 NOTE — Patient Instructions (Signed)
Continue post op shoe and strap for 2-3 more weeks. Ok to bike. Ok to hit tennis balls steady. Follow up in 2-3 weeks.

## 2011-08-06 NOTE — Progress Notes (Signed)
  Subjective:    Patient ID: Joanne Mccarty, female    DOB: [birth year], 46 y.o.   MRN: [id number]  HPI Joanne Mccarty is feeling a little better regarding her R foot, no pain with walking, no numbness  or tingling. Has been compliance with the post op shoe and strap. She has been doing some yoga. No numbness or tingling.   Patient Active Problem List  Diagnoses  . HYPOTHYROIDISM  . IRON DEFICIENCY  . ANEMIA-NOS  . EXERCISE INDUCED ASTHMA  . CONSTIPATION  . HIP PAIN, LEFT  . INSOMNIA UNSPECIFIED  . Other Symptoms Involving Cardiovascular System  . RAYNAUD'S SYNDROME, HX OF  . TOBACCO USE, QUIT  . Breast discharge  . Galactorrhea  . Rectocele  . Rectal pressure  . Foot pain, right  . Metatarsal stress fracture    Current Outpatient Prescriptions on File Prior to Visit  Medication Sig Dispense Refill  . ALPRAZolam (XANAX) 0.25 MG tablet Take 0.25 mg by mouth 3 (three) times daily as needed.        . norethindrone-ethinyl estradiol (LOESTRIN FE 1/20) 1-20 MG-MCG tablet Take 1 tablet by mouth daily.      Marland Kitchen SYNTHROID 88 MCG tablet Take 1 tablet (88 mcg total) by mouth daily.  90 tablet  3   Allergies  Allergen Reactions  . Cephalexin     REACTION: rash     Review of Systems  Constitutional: Negative for fever, chills, diaphoresis and fatigue.  Musculoskeletal: Positive for gait problem. Negative for back pain, joint swelling and arthralgias.  Neurological: Negative for weakness and numbness.       Objective:   Physical Exam  Constitutional: She is oriented to person, place, and time. She appears well-developed and well-nourished.       BP 113/69  Pulse 60   Pulmonary/Chest: Effort normal.  Neurological: She is alert and oriented to person, place, and time.  Skin: Skin is warm. No rash noted. No erythema.  Psychiatric: She has a normal mood and affect. Her behavior is normal.      MSK U/S : showing mild callus over the 3rd metatarsal. Decrease fluid in the 3rd MTPJ.      Assessment & Plan:   1. Metatarsal stress fracture      3rd  R metatarsal    Continue post op shoe and strap for 2-3 more weeks. Ok to bike. Ok to hit tennis balls steady. Follow up in 2-3 weeks.

## 2011-08-20 ENCOUNTER — Ambulatory Visit (INDEPENDENT_AMBULATORY_CARE_PROVIDER_SITE_OTHER): Payer: PRIVATE HEALTH INSURANCE | Admitting: Family Medicine

## 2011-08-20 VITALS — BP 100/60

## 2011-08-20 DIAGNOSIS — M84376A Stress fracture, unspecified foot, initial encounter for fracture: Secondary | ICD-10-CM

## 2011-08-20 NOTE — Patient Instructions (Signed)
Mobic 15 mg oral daily Vit B6 100 mg oral a day Ok to bike and try elliptical Start treadmill next week just walking Stop post op shoe. Ice massage in the tender area 20 min twice a day F/U in 2 weeks.

## 2011-08-20 NOTE — Progress Notes (Signed)
  Subjective:    Patient ID: Joanne Mccarty, female    DOB: [birth year], 46 y.o.   MRN: [id number]  HPI  Joanne Mccarty is a pleasant 46 years old female patient with the followup of her right foot third metatarsal stress fracture. She is doing better. She states that she is at least 75% better compared with 4 weeks ago. He has been using her postop shoe. She stated that now she is walking without pain. She denies any numbness or tingling. He still complains of pain in the first intermetatarsal space in between the second and third toe.  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  Patient Active Problem List  Diagnoses  . HYPOTHYROIDISM  . IRON DEFICIENCY  . ANEMIA-NOS  . EXERCISE INDUCED ASTHMA  . CONSTIPATION  . INSOMNIA UNSPECIFIED  . Other Symptoms Involving Cardiovascular System  . RAYNAUD'S SYNDROME, HX OF  . TOBACCO USE, QUIT  . Breast discharge  . Galactorrhea  . Rectocele  . Rectal pressure   Current Outpatient Prescriptions on File Prior to Visit  Medication Sig Dispense Refill  . ALPRAZolam (XANAX) 0.25 MG tablet Take 0.25 mg by mouth 3 (three) times daily as needed.        . norethindrone-ethinyl estradiol (LOESTRIN FE 1/20) 1-20 MG-MCG tablet Take 1 tablet by mouth daily.      Marland Kitchen SYNTHROID 88 MCG tablet Take 1 tablet (88 mcg total) by mouth daily.  90 tablet  3   Allergies  Allergen Reactions  . Cephalexin     REACTION: rash     Review of Systems  Constitutional: Negative for fever, chills, diaphoresis and fatigue.  Musculoskeletal: Positive for gait problem. Negative for back pain, joint swelling and arthralgias.  Neurological: Negative for weakness and numbness.       Objective:   Physical Exam  Constitutional: She is oriented to person, place, and time. She appears well-developed and well-nourished.       BP 100/60   Pulmonary/Chest: Effort normal.  Musculoskeletal:       Right  foot with intact skin. Range of motion of pole the MTPJ. No tenderness to the palpation over the shaft of the third metatarsal, no crepitus, no swelling. No neurovascularly intact.   Neurological: She is alert and oriented to person, place, and time.  Skin: Skin is warm. No rash noted. No erythema.    msk U/s : Right foot show healing of the stress fracture of the third metatarsal. There is a bursitis of the first intermetatarsal space more prominent from the plantar side.     Assessment & Plan:   1. Metatarsal stress fracture    Mobic 15 mg oral daily Vit B6 100 mg oral a day Ok to bike and try elliptical Start treadmill next week just walking Stop post op shoe. Ice massage in the tender area 20 min twice a day F/U in 2 weeks.

## 2011-08-21 ENCOUNTER — Ambulatory Visit (INDEPENDENT_AMBULATORY_CARE_PROVIDER_SITE_OTHER): Payer: PRIVATE HEALTH INSURANCE | Admitting: Obstetrics and Gynecology

## 2011-08-21 ENCOUNTER — Encounter: Payer: Self-pay | Admitting: Obstetrics and Gynecology

## 2011-08-21 VITALS — BP 116/62 | Ht 66.0 in | Wt 137.0 lb

## 2011-08-21 DIAGNOSIS — Z124 Encounter for screening for malignant neoplasm of cervix: Secondary | ICD-10-CM

## 2011-08-21 DIAGNOSIS — N816 Rectocele: Secondary | ICD-10-CM

## 2011-08-21 DIAGNOSIS — Z01419 Encounter for gynecological examination (general) (routine) without abnormal findings: Secondary | ICD-10-CM

## 2011-08-21 MED ORDER — DROSPIRENONE-ETHINYL ESTRADIOL 3-0.02 MG PO TABS: 1.0000 | ORAL_TABLET | Freq: Every day | ORAL | Status: DC

## 2011-08-21 NOTE — Progress Notes (Signed)
Addended by: Silverio Lay on: 08/21/2011 02:53 PM   Modules accepted: Orders

## 2011-08-21 NOTE — Progress Notes (Signed)
The patient reports: pt states that she was told at last visit she had a slight prolapse wanting to make sure this is not getting worse.   Contraception:husband has vasectomy  Last mammogram: approximate date 03/2011 and was normal  Last pap: approximate date 07/2010 and was normal   GC/Chlamydia cultures offered: declined HIV/RPR/HbsAg offered:  declined HSV 1 and 2 glycoprotein offered: declined  Menstrual cycle regular and monthly: Yes Menstrual flow normal: Yes  Urinary symptoms: none Normal bowel movements: Yes Reports abuse at home: No  Subjective:    Joanne Mccarty is a 47 y.o. female, G3P3000, who presents for an annual exam. Known rectocele 2/4    History   Social History  . Marital Status: Married    Spouse Name: N/A    Number of Children: N/A  . Years of Education: N/A   Social History Main Topics  . Smoking status: Never Smoker   . Smokeless tobacco: Never Used  . Alcohol Use: 8.4 oz/week    14 Glasses of wine per week  . Drug Use: No  . Sexually Active: Yes    Birth Control/ Protection: Surgical     VAS   Other Topics Concern  . 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           Cycle: monthly but on week 3 of Loestrin 1/20. PMS not improved. C/O rectal pressure when running with rectal bleeding associated with long distance  The following portions of the patient's history were reviewed and updated as appropriate: allergies, current medications, past family history, past medical history, past social history, past surgical history and problem list.  Review of Systems Pertinent items are noted in HPI. Breast:Negative for breast lump,nipple discharge or nipple retraction Gastrointestinal: Negative for abdominal pain, change in bowel habits or rectal  bleeding Urinary:negative   Objective:    BP 116/62  Ht 5\' 6"  (1.676 m)  Wt 137 lb (62.143 kg)  BMI 22.11 kg/m2  LMP 08/13/2011    Weight:  Wt Readings from Last 1 Encounters:  08/21/11 137 lb (62.143 kg)          BMI: Body mass index is 22.11 kg/(m^2).  General Appearance: Alert, appropriate appearance for age. No acute distress HEENT: Grossly normal Neck / Thyroid: Supple, no masses, nodes or enlargement Lungs: clear to auscultation bilaterally Back: No CVA tenderness Breast Exam: No masses or nodes.No dimpling, nipple retraction or discharge. Cardiovascular: Regular rate and rhythm. S1, S2, no murmur Gastrointestinal: Soft, non-tender, no masses or organomegaly Pelvic Exam: Vulva normal. Rectocele 3/4. Cystocele 2/4. uterine prolapse 2/4. O/W uterus normal size and NT.Adnexa normal Rectovaginal: normal rectal, no masses Lymphatic Exam: Non-palpable nodes in neck, clavicular, axillary, or inguinal regions Skin: no rash or abnormalities Neurologic: Normal gait and speech, no tremor  Psychiatric: Alert and oriented, appropriate affect.      Assessment:    symptomatic rectocele    Plan:    mammogram pap smear return annually or prn STD screening: declined Contraception:oral contraceptives (estrogen/progesterone) will change to yaz to improve PMS Pt interested in surgical correction of rectocele wit [facility]. Procedure, R&B reviewed as well as expected recovery. Pt will call back      [facility]

## 2011-08-27 ENCOUNTER — Encounter (HOSPITAL_COMMUNITY): Payer: Self-pay | Admitting: Pharmacist

## 2011-08-27 LAB — PAP IG W/ RFLX HPV ASCU

## 2011-09-02 ENCOUNTER — Ambulatory Visit (INDEPENDENT_AMBULATORY_CARE_PROVIDER_SITE_OTHER): Payer: PRIVATE HEALTH INSURANCE | Admitting: Obstetrics and Gynecology

## 2011-09-02 ENCOUNTER — Encounter: Payer: Self-pay | Admitting: Obstetrics and Gynecology

## 2011-09-02 ENCOUNTER — Other Ambulatory Visit: Payer: Self-pay | Admitting: Obstetrics and Gynecology

## 2011-09-02 ENCOUNTER — Telehealth: Payer: Self-pay | Admitting: Obstetrics and Gynecology

## 2011-09-02 VITALS — BP 110/72 | HR 70 | Temp 98.5°F | Resp 16 | Ht 65.5 in | Wt 136.0 lb

## 2011-09-02 DIAGNOSIS — E039 Hypothyroidism, unspecified: Secondary | ICD-10-CM

## 2011-09-02 DIAGNOSIS — Z8619 Personal history of other infectious and parasitic diseases: Secondary | ICD-10-CM

## 2011-09-02 DIAGNOSIS — Z01818 Encounter for other preprocedural examination: Secondary | ICD-10-CM

## 2011-09-02 DIAGNOSIS — N946 Dysmenorrhea, unspecified: Secondary | ICD-10-CM

## 2011-09-02 DIAGNOSIS — R0989 Other specified symptoms and signs involving the circulatory and respiratory systems: Secondary | ICD-10-CM | POA: Insufficient documentation

## 2011-09-02 DIAGNOSIS — N8189 Other female genital prolapse: Secondary | ICD-10-CM

## 2011-09-02 NOTE — Telephone Encounter (Signed)
Total Vaginal Hysterectomy/A&P Repair scheduled for 09/08/11 @ 12:00 with SR/EP.  Medcost effective 03/31/11.  Plan pays 70/30 after a $300 deductible.  Pre-op due $951.75  -Adrianne Pridgen

## 2011-09-02 NOTE — Progress Notes (Signed)
Joanne Mccarty is a 46 y.o. female [phone] who presents for a total vaginal hysterectomy with anterior and posterior colporrhaphy because of symptomatic pelvic relaxation and dysmenorrhea.  Over the past year, patient has noticed increased episodes of incontinence associated with activity. She has also noticed that it takes several attempts to totally eliminate her bowel movements,  though she has not had any constipation. Several years ago she underwent endometrial ablation for menorrhagia then hysteroscopy with D & C with a repeat ablation due to persistent bleeding attributed to an endometrial polyp. Currently she has monthly spotting x 3 days but reports 8-10/10 cramping that has not been relieved with her taking oral contraceptive. She denies intermenstrual bleeding, urinary urgency, nocturia, hematuria, dyspareunia or vaginitis symptoms.  Given the progressive and disruptive nature of her symptoms and the failure of oral contraceptives to manage her cramping, the patient has decided to proceed with hysterectomy and anterior and posterior colporrhaphy.  Past Medical History  OB History: [id number] SVB: 2001, 2003 & 2006  GYN History: menarche 46 YO   LMP 08/13/11    Contracepton: vasectomy   The patient denies history of sexually transmitted disease.  Abnormal PAP smear 1998 managed with colposcopy;  Last PAP smear 2012  Medical History: asthma, anemia, right foot stress fracture, dysplastic nevi x 3,  goiter and hypothyroidism  Surgical History: Multiple pre-cancerous skin lesion excisions 1989 Left breast fibroadenoma excision 2009 Novasure Endometrial Ablation 2009 Hysteroscopy/ Dilatation and Curettage/Repeat          Novasure Ablation-endometrial polyps 2011 Robot Assisted Bilateral Ovarian Cystectomy-Dermoid Denies problems with anesthesia or history of blood transfusions  Family History: hypertension, hyperlipidemia, abdominal cancer, migraines, Crohn's disease  Social History:  Married,  trained as  a Economist;, 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 acetaminophen (TYLENOL) 500 MG tablet Take 1,000 mg by mouth every 6 (six) hours as needed. For pain      . albuterol (PROVENTIL HFA;VENTOLIN HFA) 108 (90 BASE) MCG/ACT inhaler Inhale 1-2 puffs into the lungs every 6 (six) hours as needed. For exercise induced asthma symptoms.      . drospirenone-ethinyl estradiol (YAZ,GIANVI,LORYNA) 3-0.02 MG tablet Take 1 tablet by mouth daily.  3 Package  4  . ibuprofen (ADVIL,MOTRIN) 400 MG tablet Take 400 mg by mouth every 6 (six) hours as needed. For pain      . norethindrone-ethinyl estradiol (LOESTRIN FE 1/20) 1-20 MG-MCG tablet Take 1 tablet by mouth daily.      Marland Kitchen SYNTHROID 88 MCG tablet Take 1 tablet (88 mcg total) by mouth daily.  90 tablet  3  . ALPRAZolam (XANAX) 0.25 MG tablet Take 0.25 mg by mouth 3 (three) times daily as needed.          Allergies  Allergen Reactions  . Cephalexin     REACTION: rash   Denies sensitivity to latex, soy, peanuts, shellfish and adhesives.   ROS: + glasses (reading), has superficial right lateral thigh numbness, inguinal bruits (with negative work up),  but denies headache, vision changes, dysphagia, tinnitus, dizziness,  chest pain, shortness of breath, nausea, vomiting, diarrhea, dysuria, hematuria, pelvic pain, swelling of joints,easy bruising,  myalgias, arthralgias, skin rashes and except as is mentioned in the history of present illness, patient's review of systems is otherwise negative   Physical Exam    BP 110/72  Pulse 70  Temp(Src) 98.5 F (36.9  C) (Oral)  Resp 16  Ht 5' 5.5" (1.664 m)  Wt 136 lb (61.689 kg)  BMI 22.29 kg/m2  LMP 08/13/2011  Neck: Neck supple. No adenopathy. Thyroid somewhat diffusely enlarged  Lungs: clear to auscultation Heart: regular rate and rhythym Abdomen:  soft without significant tenderness, masses, organomegaly  Pelvic:EGBUS-wnl, vagina-2/4 cystocele and 3/4 rectocele with uterine descensus to 1/2 length of vagina, cervix-no lesions but ? scar tissue on anterior lip, uterus-normal size adnexae-no tenderness or masses Extremities:  no clubbing, cyanosis or edema   Assesment: Symptomatic Pelvic Relaxation                      Dysmenorrhea   Disposition:  A discussion was held with patient regarding the indication for her procedure(s) along with the risks, which include but are not limited to: reaction to anesthesia, damage to adjacent organs, infection, early menopause, pelvic prolapse and excessive bleeding.   CSN# [id number]   Tanay Massiah J. Lowell Guitar, PA-C  for Dr. Estanislado Pandy

## 2011-09-02 NOTE — H&P (Signed)
Joanne Mccarty is a 46 y.o. female G3P3003 who presents for a total vaginal hysterectomy with anterior and posterior colporrhaphy because of symptomatic pelvic relaxation and dysmenorrhea.  Over the past year, patient has noticed increased episodes of incontinence associated with activity. She has also noticed that it takes several attempts to totally eliminate her bowel movements,  though she has not had any constipation. Several years ago she underwent endometrial ablation for menorrhagia then hysteroscopy with D & C with a repeat ablation due to persistent bleeding attributed to an endometrial polyp. Currently she has monthly spotting x 3 days but reports 8-10/10 cramping that has not been relieved with her taking oral contraceptive. She denies intermenstrual bleeding, urinary urgency, nocturia, hematuria, dyspareunia or vaginitis symptoms.  Given the progressive and disruptive nature of her symptoms and the failure of oral contraceptives to manage her cramping, the patient has decided to proceed with hysterectomy and anterior and posterior colporrhaphy.  Past Medical History  OB History: G3P3003 SVB: 2001, 2003 & 2006  GYN History: menarche 46 YO   LMP 08/13/11    Contracepton: vasectomy   The patient denies history of sexually transmitted disease.  Abnormal PAP smear 1998 managed with colposcopy;  Last PAP smear 2012  Medical History: asthma, anemia, right foot stress fracture, dysplastic nevi x 3,  goiter and hypothyroidism  Surgical History: Multiple pre-cancerous skin lesion excisions 1989 Left breast fibroadenoma excision 2009 Novasure Endometrial Ablation 2009 Hysteroscopy/ Dilatation and Curettage/Repeat          Novasure Ablation-endometrial polyps 2011 Robot Assisted Bilateral Ovarian Cystectomy-Dermoid Denies problems with anesthesia or history of blood transfusions  Family History: hypertension, hyperlipidemia, abdominal cancer, migraines, Crohn's disease  Social History:  Married,  trained as  a nurse practiitone;, 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 acetaminophen (TYLENOL) 500 MG tablet Take 1,000 mg by mouth every 6 (six) hours as needed. For pain      . albuterol (PROVENTIL HFA;VENTOLIN HFA) 108 (90 BASE) MCG/ACT inhaler Inhale 1-2 puffs into the lungs every 6 (six) hours as needed. For exercise induced asthma symptoms.      . drospirenone-ethinyl estradiol (YAZ,GIANVI,LORYNA) 3-0.02 MG tablet Take 1 tablet by mouth daily.  3 Package  4  . ibuprofen (ADVIL,MOTRIN) 400 MG tablet Take 400 mg by mouth every 6 (six) hours as needed. For pain      . norethindrone-ethinyl estradiol (LOESTRIN FE 1/20) 1-20 MG-MCG tablet Take 1 tablet by mouth daily.      . SYNTHROID 88 MCG tablet Take 1 tablet (88 mcg total) by mouth daily.  90 tablet  3  . ALPRAZolam (XANAX) 0.25 MG tablet Take 0.25 mg by mouth 3 (three) times daily as needed.          Allergies  Allergen Reactions  . Cephalexin     REACTION: rash   Denies sensitivity to latex, soy, peanuts, shellfish and adhesives.   ROS: + glasses (reading), has superficial right lateral thigh numbness, inguinal bruits (with negative work up),  but denies headache, vision changes, dysphagia, tinnitus, dizziness,  chest pain, shortness of breath, nausea, vomiting, diarrhea, dysuria, hematuria, pelvic pain, swelling of joints,easy bruising,  myalgias, arthralgias, skin rashes and except as is mentioned in the history of present illness, patient's review of systems is otherwise negative   Physical Exam    BP 110/72  Pulse 70  Temp(Src) 98.5 F (36.9   C) (Oral)  Resp 16  Ht 5' 5.5" (1.664 m)  Wt 136 lb (61.689 kg)  BMI 22.29 kg/m2  LMP 08/13/2011  Neck: Neck supple. No adenopathy. Thyroid somewhat diffusely enlarged  Lungs: clear to auscultation Heart: regular rate and rhythym Abdomen:  soft without significant tenderness, masses, organomegaly  Pelvic:EGBUS-wnl, vagina-2/4 cystocele and 3/4 rectocele with uterine descensus to 1/2 length of vagina, cervix-no lesions but ? scar tissue on anterior lip, uterus-normal size adnexae-no tenderness or masses Extremities:  no clubbing, cyanosis or edema   Assesment: Symptomatic Pelvic Relaxation                      Dysmenorrhea   Disposition:  A discussion was held with patient regarding the indication for her procedure(s) along with the risks, which include but are not limited to: reaction to anesthesia, damage to adjacent organs, infection, early menopause, pelvic prolapse and excessive bleeding.   CSN# [id number]   Rileigh Kawashima J. Avid Guillette, PA-C  for Dr. Rivard  

## 2011-09-03 ENCOUNTER — Encounter (HOSPITAL_COMMUNITY)
Admission: RE | Admit: 2011-09-03 | Discharge: 2011-09-03 | Disposition: A | Discharge status: Home / Self Care | Payer: PRIVATE HEALTH INSURANCE | Source: Ambulatory Visit | Attending: Obstetrics and Gynecology | Admitting: Obstetrics and Gynecology

## 2011-09-03 ENCOUNTER — Telehealth: Payer: Self-pay | Admitting: Obstetrics and Gynecology

## 2011-09-03 ENCOUNTER — Ambulatory Visit (INDEPENDENT_AMBULATORY_CARE_PROVIDER_SITE_OTHER): Payer: PRIVATE HEALTH INSURANCE | Admitting: Family Medicine

## 2011-09-03 ENCOUNTER — Encounter (HOSPITAL_COMMUNITY): Payer: Self-pay

## 2011-09-03 VITALS — BP 104/60

## 2011-09-03 DIAGNOSIS — M84376A Stress fracture, unspecified foot, initial encounter for fracture: Secondary | ICD-10-CM

## 2011-09-03 HISTORY — DX: Other specified postprocedural states: R11.2

## 2011-09-03 HISTORY — DX: Personal history of other diseases of the circulatory system: Z86.79

## 2011-09-03 HISTORY — DX: Other specified postprocedural states: Z98.890

## 2011-09-03 LAB — CBC
HCT: 39.5 % (ref 36.0–46.0)
MCH: 31 pg (ref 26.0–34.0)
MCHC: 32.9 g/dL (ref 30.0–36.0)
MCV: 94 fL (ref 78.0–100.0)
RDW: 13.8 % (ref 11.5–15.5)
WBC: 8.5 10*3/uL (ref 4.0–10.5)

## 2011-09-03 NOTE — Telephone Encounter (Signed)
Joanne Mccarty/epic/SR pt 

## 2011-09-03 NOTE — Patient Instructions (Addendum)
   Your procedure is scheduled on: Tuesday June 11th  Enter through the Main Entrance of [facility] at: 10:30am Pick up the phone at the desk and dial [phone] and inform us of your arrival.  Please call this number if you have any problems the morning of surgery: [phone]  Remember: Do not eat food after midnight: Monday Do not drink clear liquids after: 8am Tuesday Take these medicines the morning of surgery with a SIP OF WATER: May take morning medications before 8am. Bring albuterol inhaler day of surgery  Do not wear jewelry, make-up, or FINGER nail polish Do not wear lotions, powders, perfumes or deodorant. Do not shave 48 hours prior to surgery. Do not bring valuables to the hospital. Contacts, dentures or bridgework may not be worn into surgery.  Leave suitcase in the car. After Surgery it may be brought to your room. For patients being admitted to the hospital, checkout time is 11:00am the day of discharge.  .     Remember to use your hibiclens as instructed.Please shower with 1/2 bottle the evening before your surgery and the other 1/2 bottle the morning of surgery. Neck down avoiding private area.

## 2011-09-03 NOTE — Progress Notes (Signed)
  Subjective:    Patient ID: Joanne Mccarty, female    DOB: Dec 05, 1965, 46 y.o.   MRN: [id number]  HPI  Joanne Mccarty is 7 weeks s/p 3rd right metatarsal stress fracture, she is doing well, not using the post op shoe for the last 3 weeks. She has been biking to keep her fitness, no running. She will have an hysterectomy soon.  Patient Active Problem List  Diagnoses  . HYPOTHYROIDISM  . IRON DEFICIENCY  . ANEMIA-NOS  . EXERCISE INDUCED ASTHMA  . CONSTIPATION  . INSOMNIA UNSPECIFIED  . Other Symptoms Involving Cardiovascular System  . RAYNAUD'S SYNDROME, HX OF  . TOBACCO USE, QUIT  . Breast discharge  . Galactorrhea  . Rectocele  . Rectal pressure  . History of chickenpox  . Abdominal bruit  . Hypothyroidism   Current Outpatient Prescriptions on File Prior to Visit  Medication Sig Dispense Refill  . acetaminophen (TYLENOL) 500 MG tablet Take 1,000 mg by mouth every 6 (six) hours as needed. For pain      . albuterol (PROVENTIL HFA;VENTOLIN HFA) 108 (90 BASE) MCG/ACT inhaler Inhale 1-2 puffs into the lungs every 6 (six) hours as needed. For exercise induced asthma symptoms.      . ALPRAZolam (XANAX) 0.25 MG tablet Take 0.25 mg by mouth 3 (three) times daily as needed.        . drospirenone-ethinyl estradiol (YAZ,GIANVI,LORYNA) 3-0.02 MG tablet Take 1 tablet by mouth daily.  3 Package  4  . ibuprofen (ADVIL,MOTRIN) 400 MG tablet Take 400 mg by mouth every 6 (six) hours as needed. For pain      . norethindrone-ethinyl estradiol (LOESTRIN FE 1/20) 1-20 MG-MCG tablet Take 1 tablet by mouth daily.      Marland Kitchen SYNTHROID 88 MCG tablet Take 1 tablet (88 mcg total) by mouth daily.  90 tablet  3   Allergies  Allergen Reactions  . Cephalexin     REACTION: rash      Review of Systems  Constitutional: Negative for fever, chills, diaphoresis and fatigue.  Neurological: Negative for weakness and numbness.       Objective:   Physical Exam  Constitutional: She is oriented to person, place, and  time. She appears well-developed and well-nourished.       BP 104/60  LMP 08/13/2011   Pulmonary/Chest: Effort normal.  Musculoskeletal:       Right foot with intact skin. Range of motion of pole the MTPJ. No tenderness to the palpation over the shaft of the third metatarsal, no crepitus, no swelling. No neurovascularly intact.      Neurological: She is alert and oriented to person, place, and time.  Skin: Skin is warm. No rash noted. No erythema.  Psychiatric: She has a normal mood and affect. Her behavior is normal. Thought content normal.    msk U/s : Right foot show healing of the stress fracture of the third metatarsal. Cap sign present in the third metatarsal     Assessment & Plan:   1. Metatarsal stress fracture    She will rest another 6 week due to her abdominal surgery. We will see her back after that if she still with symptoms.

## 2011-09-08 ENCOUNTER — Encounter (HOSPITAL_COMMUNITY): Payer: Self-pay

## 2011-09-08 ENCOUNTER — Ambulatory Visit (HOSPITAL_COMMUNITY): Payer: PRIVATE HEALTH INSURANCE

## 2011-09-08 ENCOUNTER — Encounter (HOSPITAL_COMMUNITY): Payer: Self-pay | Admitting: *Deleted

## 2011-09-08 ENCOUNTER — Encounter (HOSPITAL_COMMUNITY)
Admission: RE | Disposition: A | Discharge status: Home / Self Care | Payer: Self-pay | Source: Ambulatory Visit | Attending: Obstetrics and Gynecology

## 2011-09-08 ENCOUNTER — Other Ambulatory Visit: Payer: Self-pay | Admitting: Obstetrics and Gynecology

## 2011-09-08 ENCOUNTER — Ambulatory Visit (HOSPITAL_COMMUNITY)
Admission: RE | Admit: 2011-09-08 | Discharge: 2011-09-10 | Disposition: A | Discharge status: Home / Self Care | Payer: PRIVATE HEALTH INSURANCE | Source: Ambulatory Visit | Attending: Obstetrics and Gynecology | Admitting: Obstetrics and Gynecology

## 2011-09-08 DIAGNOSIS — N816 Rectocele: Secondary | ICD-10-CM

## 2011-09-08 DIAGNOSIS — N8189 Other female genital prolapse: Secondary | ICD-10-CM

## 2011-09-08 DIAGNOSIS — N812 Incomplete uterovaginal prolapse: Secondary | ICD-10-CM | POA: Insufficient documentation

## 2011-09-08 DIAGNOSIS — Z01818 Encounter for other preprocedural examination: Secondary | ICD-10-CM | POA: Insufficient documentation

## 2011-09-08 DIAGNOSIS — D251 Intramural leiomyoma of uterus: Secondary | ICD-10-CM | POA: Insufficient documentation

## 2011-09-08 DIAGNOSIS — N946 Dysmenorrhea, unspecified: Secondary | ICD-10-CM | POA: Insufficient documentation

## 2011-09-08 DIAGNOSIS — Z01812 Encounter for preprocedural laboratory examination: Secondary | ICD-10-CM | POA: Insufficient documentation

## 2011-09-08 HISTORY — PX: RECTOCELE REPAIR: SHX761

## 2011-09-08 HISTORY — PX: VAGINAL HYSTERECTOMY: SHX2639

## 2011-09-08 SURGERY — HYSTERECTOMY, VAGINAL
Anesthesia: General | Site: Vagina | Wound class: Clean Contaminated

## 2011-09-08 MED ORDER — 0.9 % SODIUM CHLORIDE (POUR BTL) OPTIME
TOPICAL | Status: DC | PRN
  Administered 2011-09-08: 1000 mL

## 2011-09-08 MED ORDER — CIPROFLOXACIN IN D5W 400 MG/200ML IV SOLN
400.0000 mg | INTRAVENOUS | Status: DC
  Filled 2011-09-08: qty 200

## 2011-09-08 MED ORDER — DEXAMETHASONE SODIUM PHOSPHATE 10 MG/ML IJ SOLN
INTRAMUSCULAR | Status: AC
  Filled 2011-09-08: qty 1

## 2011-09-08 MED ORDER — HYDROMORPHONE HCL PF 1 MG/ML IJ SOLN
0.2500 mg | INTRAMUSCULAR | Status: DC | PRN
  Administered 2011-09-08: 0.5 mg via INTRAVENOUS
  Administered 2011-09-08 (×2): 0.25 mg via INTRAVENOUS
  Administered 2011-09-08 (×2): 0.5 mg via INTRAVENOUS

## 2011-09-08 MED ORDER — FENTANYL CITRATE 0.05 MG/ML IJ SOLN
INTRAMUSCULAR | Status: AC
  Filled 2011-09-08: qty 2

## 2011-09-08 MED ORDER — ESTRADIOL 0.1 MG/GM VA CREA
TOPICAL_CREAM | VAGINAL | Status: AC
  Filled 2011-09-08: qty 42.5

## 2011-09-08 MED ORDER — MORPHINE SULFATE (PF) 1 MG/ML IV SOLN
INTRAVENOUS | Status: DC
  Administered 2011-09-08: 22:00:00 via INTRAVENOUS
  Administered 2011-09-09: 5 mg via INTRAVENOUS
  Administered 2011-09-09: 7 mg via INTRAVENOUS
  Administered 2011-09-09: 4.5 mg via INTRAVENOUS
  Filled 2011-09-08: qty 25

## 2011-09-08 MED ORDER — ONDANSETRON HCL 4 MG/2ML IJ SOLN
INTRAMUSCULAR | Status: DC | PRN
  Administered 2011-09-08: 4 mg via INTRAVENOUS

## 2011-09-08 MED ORDER — GLYCOPYRROLATE 0.2 MG/ML IJ SOLN
INTRAMUSCULAR | Status: DC | PRN
  Administered 2011-09-08: 1 mg via INTRAVENOUS
  Administered 2011-09-08: 0.2 mg via INTRAVENOUS

## 2011-09-08 MED ORDER — PROPOFOL 10 MG/ML IV EMUL
INTRAVENOUS | Status: AC
  Filled 2011-09-08: qty 20

## 2011-09-08 MED ORDER — SODIUM CHLORIDE 0.9 % IJ SOLN: 9.0000 mL | INTRAMUSCULAR | Status: DC | PRN

## 2011-09-08 MED ORDER — LIDOCAINE HCL (CARDIAC) 20 MG/ML IV SOLN
INTRAVENOUS | Status: DC | PRN
  Administered 2011-09-08: 30 mg via INTRAVENOUS

## 2011-09-08 MED ORDER — ONDANSETRON HCL 4 MG/2ML IJ SOLN
4.0000 mg | Freq: Four times a day (QID) | INTRAMUSCULAR | Status: DC | PRN
  Administered 2011-09-08: 4 mg via INTRAVENOUS
  Filled 2011-09-08: qty 2

## 2011-09-08 MED ORDER — DEXAMETHASONE SODIUM PHOSPHATE 4 MG/ML IJ SOLN
INTRAMUSCULAR | Status: DC | PRN
  Administered 2011-09-08: 10 mg via INTRAVENOUS

## 2011-09-08 MED ORDER — NEOSTIGMINE METHYLSULFATE 1 MG/ML IJ SOLN
INTRAMUSCULAR | Status: AC
  Filled 2011-09-08: qty 10

## 2011-09-08 MED ORDER — CLINDAMYCIN PHOSPHATE 900 MG/50ML IV SOLN
900.0000 mg | INTRAVENOUS | Status: DC
  Filled 2011-09-08: qty 50

## 2011-09-08 MED ORDER — DIPHENHYDRAMINE HCL 50 MG/ML IJ SOLN: 12.5000 mg | Freq: Four times a day (QID) | INTRAMUSCULAR | Status: DC | PRN

## 2011-09-08 MED ORDER — LACTATED RINGERS IV SOLN
INTRAVENOUS | Status: DC
  Administered 2011-09-08: 17:00:00 via INTRAVENOUS

## 2011-09-08 MED ORDER — ROCURONIUM BROMIDE 50 MG/5ML IV SOLN
INTRAVENOUS | Status: AC
  Filled 2011-09-08: qty 1

## 2011-09-08 MED ORDER — LIDOCAINE-EPINEPHRINE (PF) 1 %-1:200000 IJ SOLN
INTRAMUSCULAR | Status: AC
  Filled 2011-09-08: qty 10

## 2011-09-08 MED ORDER — DIPHENHYDRAMINE HCL 12.5 MG/5ML PO ELIX: 12.5000 mg | ORAL_SOLUTION | Freq: Four times a day (QID) | ORAL | Status: DC | PRN

## 2011-09-08 MED ORDER — IBUPROFEN 600 MG PO TABS: 600.0000 mg | ORAL_TABLET | Freq: Four times a day (QID) | ORAL | Status: DC | PRN

## 2011-09-08 MED ORDER — GLYCOPYRROLATE 0.2 MG/ML IJ SOLN
INTRAMUSCULAR | Status: AC
  Filled 2011-09-08: qty 1

## 2011-09-08 MED ORDER — ALBUTEROL SULFATE HFA 108 (90 BASE) MCG/ACT IN AERS: 1.0000 | INHALATION_SPRAY | Freq: Four times a day (QID) | RESPIRATORY_TRACT | Status: DC | PRN

## 2011-09-08 MED ORDER — ONDANSETRON HCL 4 MG/2ML IJ SOLN
INTRAMUSCULAR | Status: AC
  Filled 2011-09-08: qty 2

## 2011-09-08 MED ORDER — HYDROMORPHONE HCL PF 1 MG/ML IJ SOLN
INTRAMUSCULAR | Status: AC
  Administered 2011-09-08: 0.5 mg via INTRAVENOUS
  Filled 2011-09-08: qty 1

## 2011-09-08 MED ORDER — HYDROMORPHONE HCL PF 1 MG/ML IJ SOLN
INTRAMUSCULAR | Status: AC
  Filled 2011-09-08: qty 1

## 2011-09-08 MED ORDER — HYDROMORPHONE 0.3 MG/ML IV SOLN
INTRAVENOUS | Status: DC
  Administered 2011-09-08: 0.999 mg via INTRAVENOUS
  Administered 2011-09-08: 17:00:00 via INTRAVENOUS
  Filled 2011-09-08: qty 25

## 2011-09-08 MED ORDER — FENTANYL CITRATE 0.05 MG/ML IJ SOLN
INTRAMUSCULAR | Status: AC
  Filled 2011-09-08: qty 5

## 2011-09-08 MED ORDER — NALOXONE HCL 0.4 MG/ML IJ SOLN: 0.4000 mg | INTRAMUSCULAR | Status: DC | PRN

## 2011-09-08 MED ORDER — LIDOCAINE HCL (CARDIAC) 20 MG/ML IV SOLN
INTRAVENOUS | Status: AC
  Filled 2011-09-08: qty 5

## 2011-09-08 MED ORDER — PROPOFOL 10 MG/ML IV EMUL
INTRAVENOUS | Status: DC | PRN
  Administered 2011-09-08: 200 mg via INTRAVENOUS

## 2011-09-08 MED ORDER — OXYCODONE-ACETAMINOPHEN 5-325 MG PO TABS
1.0000 | ORAL_TABLET | ORAL | Status: DC | PRN
  Administered 2011-09-09: 2 via ORAL
  Administered 2011-09-09: 1 via ORAL
  Administered 2011-09-09: 2 via ORAL
  Administered 2011-09-09 – 2011-09-10 (×2): 1 via ORAL
  Filled 2011-09-08: qty 2
  Filled 2011-09-08 (×3): qty 1
  Filled 2011-09-08: qty 2

## 2011-09-08 MED ORDER — ROCURONIUM BROMIDE 100 MG/10ML IV SOLN
INTRAVENOUS | Status: DC | PRN
  Administered 2011-09-08: 40 mg via INTRAVENOUS
  Administered 2011-09-08: 10 mg via INTRAVENOUS

## 2011-09-08 MED ORDER — NEOSTIGMINE METHYLSULFATE 1 MG/ML IJ SOLN
INTRAMUSCULAR | Status: DC | PRN
  Administered 2011-09-08: 5 mg via INTRAVENOUS

## 2011-09-08 MED ORDER — LIDOCAINE-EPINEPHRINE (PF) 1 %-1:200000 IJ SOLN
INTRAMUSCULAR | Status: DC | PRN
  Administered 2011-09-08: 30 mL

## 2011-09-08 MED ORDER — ONDANSETRON HCL 4 MG/2ML IJ SOLN
4.0000 mg | Freq: Four times a day (QID) | INTRAMUSCULAR | Status: DC | PRN
Start: 2011-09-08 — End: 2011-09-09

## 2011-09-08 MED ORDER — LEVOTHYROXINE SODIUM 88 MCG PO TABS
88.0000 ug | ORAL_TABLET | Freq: Every day | ORAL | Status: DC
  Administered 2011-09-09 – 2011-09-10 (×2): 88 ug via ORAL
  Filled 2011-09-08 (×3): qty 1

## 2011-09-08 MED ORDER — MIDAZOLAM HCL 5 MG/5ML IJ SOLN
INTRAMUSCULAR | Status: DC | PRN
  Administered 2011-09-08: 2 mg via INTRAVENOUS

## 2011-09-08 MED ORDER — PROMETHAZINE HCL 25 MG/ML IJ SOLN
25.0000 mg | Freq: Four times a day (QID) | INTRAMUSCULAR | Status: DC | PRN
  Administered 2011-09-08: 12.5 mg via INTRAVENOUS
  Filled 2011-09-08: qty 1

## 2011-09-08 MED ORDER — GLYCOPYRROLATE 0.2 MG/ML IJ SOLN
INTRAMUSCULAR | Status: AC
  Filled 2011-09-08: qty 2

## 2011-09-08 MED ORDER — CIPROFLOXACIN IN D5W 400 MG/200ML IV SOLN
INTRAVENOUS | Status: DC | PRN
  Administered 2011-09-08: 400 mg via INTRAVENOUS

## 2011-09-08 MED ORDER — KETOROLAC TROMETHAMINE 30 MG/ML IJ SOLN
30.0000 mg | Freq: Four times a day (QID) | INTRAMUSCULAR | Status: DC
  Administered 2011-09-08: 30 mg via INTRAVENOUS

## 2011-09-08 MED ORDER — KETOROLAC TROMETHAMINE 30 MG/ML IJ SOLN
INTRAMUSCULAR | Status: AC
  Administered 2011-09-08: 30 mg
  Filled 2011-09-08: qty 1

## 2011-09-08 MED ORDER — MENTHOL 3 MG MT LOZG: 1.0000 | LOZENGE | OROMUCOSAL | Status: DC | PRN

## 2011-09-08 MED ORDER — ESTRADIOL 0.1 MG/GM VA CREA
TOPICAL_CREAM | VAGINAL | Status: DC | PRN
  Administered 2011-09-08: 1 via VAGINAL

## 2011-09-08 MED ORDER — LACTATED RINGERS IV SOLN
INTRAVENOUS | Status: DC
  Administered 2011-09-08 (×3): via INTRAVENOUS

## 2011-09-08 MED ORDER — DIPHENHYDRAMINE HCL 12.5 MG/5ML PO ELIX
12.5000 mg | ORAL_SOLUTION | Freq: Four times a day (QID) | ORAL | Status: DC | PRN
  Filled 2011-09-08: qty 5

## 2011-09-08 MED ORDER — FENTANYL CITRATE 0.05 MG/ML IJ SOLN
INTRAMUSCULAR | Status: DC | PRN
  Administered 2011-09-08: 25 ug via INTRAVENOUS
  Administered 2011-09-08: 50 ug via INTRAVENOUS
  Administered 2011-09-08: 100 ug via INTRAVENOUS
  Administered 2011-09-08: 50 ug via INTRAVENOUS
  Administered 2011-09-08: 75 ug via INTRAVENOUS
  Administered 2011-09-08: 50 ug via INTRAVENOUS

## 2011-09-08 MED ORDER — MIDAZOLAM HCL 2 MG/2ML IJ SOLN
INTRAMUSCULAR | Status: AC
  Filled 2011-09-08: qty 2

## 2011-09-08 MED ORDER — KETOROLAC TROMETHAMINE 30 MG/ML IJ SOLN
30.0000 mg | Freq: Four times a day (QID) | INTRAMUSCULAR | Status: DC
  Administered 2011-09-08 – 2011-09-09 (×2): 30 mg via INTRAVENOUS
  Filled 2011-09-08 (×2): qty 1

## 2011-09-08 MED ORDER — CLINDAMYCIN PHOSPHATE 600 MG/50ML IV SOLN
INTRAVENOUS | Status: DC | PRN
  Administered 2011-09-08: 900 mg via INTRAVENOUS

## 2011-09-08 MED ORDER — ONDANSETRON HCL 4 MG PO TABS
4.0000 mg | ORAL_TABLET | Freq: Three times a day (TID) | ORAL | Status: DC | PRN
  Administered 2011-09-08: 4 mg via ORAL
  Filled 2011-09-08: qty 1

## 2011-09-08 SURGICAL SUPPLY — 36 items
CANISTER SUCTION 2500CC (MISCELLANEOUS) ×4 IMPLANT
CLOTH BEACON ORANGE TIMEOUT ST (SAFETY) ×4 IMPLANT
CONT PATH 16OZ SNAP LID 3702 (MISCELLANEOUS) IMPLANT
DECANTER SPIKE VIAL GLASS SM (MISCELLANEOUS) IMPLANT
DRAPE PROXIMA HALF (DRAPES) ×8 IMPLANT
DRAPE STERI URO 9X17 APER PCH (DRAPES) ×6 IMPLANT
GAUZE PACKING 1 X5 YD ST (GAUZE/BANDAGES/DRESSINGS) ×2 IMPLANT
GAUZE SPONGE 4X4 16PLY XRAY LF (GAUZE/BANDAGES/DRESSINGS) ×2 IMPLANT
GLOVE BIO SURGEON STRL SZ 6.5 (GLOVE) ×2 IMPLANT
GLOVE BIOGEL PI IND STRL 7.0 (GLOVE) ×12 IMPLANT
GLOVE BIOGEL PI INDICATOR 7.0 (GLOVE) ×5
GLOVE ECLIPSE 6.5 STRL STRAW (GLOVE) ×7 IMPLANT
GLOVE NEODERM STER SZ 7 (GLOVE) ×3 IMPLANT
GLOVE SKINSENSE NS SZ7.0 (GLOVE) ×2
GLOVE SKINSENSE STRL SZ7.0 (GLOVE) ×2 IMPLANT
GOWN PREVENTION PLUS LG XLONG (DISPOSABLE) ×18 IMPLANT
GOWN STRL REIN XL XLG (GOWN DISPOSABLE) ×4 IMPLANT
NDL SPNL 20GX3.5 QUINCKE YW (NEEDLE) ×1 IMPLANT
NEEDLE HYPO 22GX1.5 SAFETY (NEEDLE) IMPLANT
NEEDLE SPNL 20GX3.5 QUINCKE YW (NEEDLE) ×4 IMPLANT
NS IRRIG 1000ML POUR BTL (IV SOLUTION) ×4 IMPLANT
PACK VAGINAL WOMENS (CUSTOM PROCEDURE TRAY) ×4 IMPLANT
SUT VIC AB 0 CT1 18XCR BRD8 (SUTURE) ×8 IMPLANT
SUT VIC AB 0 CT1 27 (SUTURE) ×24
SUT VIC AB 0 CT1 27XBRD ANBCTR (SUTURE) ×10 IMPLANT
SUT VIC AB 0 CT1 8-18 (SUTURE) ×8
SUT VIC AB 2-0 CT1 27 (SUTURE)
SUT VIC AB 2-0 CT1 TAPERPNT 27 (SUTURE) IMPLANT
SUT VIC AB 2-0 CT2 27 (SUTURE) ×6 IMPLANT
SUT VIC AB 3-0 SH 27 (SUTURE) ×12
SUT VIC AB 3-0 SH 27XBRD (SUTURE) ×7 IMPLANT
SUT VICRYL 0 TIES 12 18 (SUTURE) ×4 IMPLANT
SYR TB 1ML 25GX5/8 (SYRINGE) ×4 IMPLANT
TOWEL OR 17X24 6PK STRL BLUE (TOWEL DISPOSABLE) ×8 IMPLANT
TRAY FOLEY CATH 14FR (SET/KITS/TRAYS/PACK) ×4 IMPLANT
WATER STERILE IRR 1000ML POUR (IV SOLUTION) ×4 IMPLANT

## 2011-09-08 NOTE — Brief Op Note (Signed)
09/08/2011  2:31 PM  PATIENT:  Joanne Mccarty  46 y.o. female  PRE-OPERATIVE DIAGNOSIS:  Symptomatic pelvic prolapse  POST-OPERATIVE DIAGNOSIS:  Same    Procedure(s): HYSTERECTOMY VAGINAL POSTERIOR REPAIR (RECTOCELE)    Surgeon(s): Esmeralda Arthur, MD   ASSISTANTS: Henreitta Leber PA-C   ANESTHESIA:   general  LOCAL MEDICATIONS USED:  LIDOCAINE   SPECIMEN:  Source of Specimen:  uterus  DISPOSITION OF SPECIMEN:  PATHOLOGY  COUNTS:  YES  ESTIMATED BLOOD LOSS: 100 cc  PATIENT DISPOSITION:  PACU - hemodynamically stable.      [id number] A MD 09/08/2011 2:31 PM

## 2011-09-08 NOTE — Transfer of Care (Signed)
Immediate Anesthesia Transfer of Care Note  Patient: Joanne Mccarty  Procedure(s) Performed: Procedure(s) (LRB): HYSTERECTOMY VAGINAL (N/A) POSTERIOR REPAIR (RECTOCELE) ()  Patient Location: PACU  Anesthesia Type: General  Level of Consciousness: awake, alert  and oriented  Airway & Oxygen Therapy: Patient Spontanous Breathing  Post-op Assessment: Report given to PACU RN, Post -op Vital signs reviewed and stable and Patient moving all extremities X 4  Post vital signs: Reviewed and stable  Complications: No apparent anesthesia complications

## 2011-09-08 NOTE — Anesthesia Procedure Notes (Signed)
Procedure Name: Intubation Date/Time: 09/08/2011 12:43 PM Performed by: Lincoln Brigham Pre-anesthesia Checklist: Patient identified, Timeout performed, Emergency Drugs available, Suction available and Patient being monitored Patient Re-evaluated:Patient Re-evaluated prior to inductionOxygen Delivery Method: Circle system utilized Preoxygenation: Pre-oxygenation with 100% oxygen Intubation Type: IV induction Ventilation: Mask ventilation without difficulty Laryngoscope Size: Miller and 2 Grade View: Grade I Tube type: Oral Tube size: 7.0 mm Number of attempts: 1 Airway Equipment and Method: Stylet Placement Confirmation: ETT inserted through vocal cords under direct vision,  positive ETCO2 and breath sounds checked- equal and bilateral Secured at: 22 cm Tube secured with: Tape Dental Injury: Teeth and Oropharynx as per pre-operative assessment

## 2011-09-08 NOTE — H&P (View-Only) (Signed)
Joanne Mccarty is a 45 y.o. female G3P3003 who presents for a total vaginal hysterectomy with anterior and posterior colporrhaphy because of symptomatic pelvic relaxation and dysmenorrhea.  Over the past year, patient has noticed increased episodes of incontinence associated with activity. She has also noticed that it takes several attempts to totally eliminate her bowel movements,  though she has not had any constipation. Several years ago she underwent endometrial ablation for menorrhagia then hysteroscopy with D & C with a repeat ablation due to persistent bleeding attributed to an endometrial polyp. Currently she has monthly spotting x 3 days but reports 8-10/10 cramping that has not been relieved with her taking oral contraceptive. She denies intermenstrual bleeding, urinary urgency, nocturia, hematuria, dyspareunia or vaginitis symptoms.  Given the progressive and disruptive nature of her symptoms and the failure of oral contraceptives to manage her cramping, the patient has decided to proceed with hysterectomy and anterior and posterior colporrhaphy.  Past Medical History  OB History: G3P3003 SVB: 2001, 2003 & 2006  GYN History: menarche 46 YO   LMP 08/13/11    Contracepton: vasectomy   The patient denies history of sexually transmitted disease.  Abnormal PAP smear 1998 managed with colposcopy;  Last PAP smear 2012  Medical History: asthma, anemia, right foot stress fracture, dysplastic nevi x 3,  goiter and hypothyroidism  Surgical History: Multiple pre-cancerous skin lesion excisions 1989 Left breast fibroadenoma excision 2009 Novasure Endometrial Ablation 2009 Hysteroscopy/ Dilatation and Curettage/Repeat          Novasure Ablation-endometrial polyps 2011 Robot Assisted Bilateral Ovarian Cystectomy-Dermoid Denies problems with anesthesia or history of blood transfusions  Family History: hypertension, hyperlipidemia, abdominal cancer, migraines, Crohn's disease  Social History:  Married,  trained as  a nurse practiitone;, 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 acetaminophen (TYLENOL) 500 MG tablet Take 1,000 mg by mouth every 6 (six) hours as needed. For pain      . albuterol (PROVENTIL HFA;VENTOLIN HFA) 108 (90 BASE) MCG/ACT inhaler Inhale 1-2 puffs into the lungs every 6 (six) hours as needed. For exercise induced asthma symptoms.      . drospirenone-ethinyl estradiol (YAZ,GIANVI,LORYNA) 3-0.02 MG tablet Take 1 tablet by mouth daily.  3 Package  4  . ibuprofen (ADVIL,MOTRIN) 400 MG tablet Take 400 mg by mouth every 6 (six) hours as needed. For pain      . norethindrone-ethinyl estradiol (LOESTRIN FE 1/20) 1-20 MG-MCG tablet Take 1 tablet by mouth daily.      . SYNTHROID 88 MCG tablet Take 1 tablet (88 mcg total) by mouth daily.  90 tablet  3  . ALPRAZolam (XANAX) 0.25 MG tablet Take 0.25 mg by mouth 3 (three) times daily as needed.          Allergies  Allergen Reactions  . Cephalexin     REACTION: rash   Denies sensitivity to latex, soy, peanuts, shellfish and adhesives.   ROS: + glasses (reading), has superficial right lateral thigh numbness, inguinal bruits (with negative work up),  but denies headache, vision changes, dysphagia, tinnitus, dizziness,  chest pain, shortness of breath, nausea, vomiting, diarrhea, dysuria, hematuria, pelvic pain, swelling of joints,easy bruising,  myalgias, arthralgias, skin rashes and except as is mentioned in the history of present illness, patient's review of systems is otherwise negative   Physical Exam    BP 110/72  Pulse 70  Temp(Src) 98.5 F (36.9   C) (Oral)  Resp 16  Ht 5' 5.5" (1.664 m)  Wt 136 lb (61.689 kg)  BMI 22.29 kg/m2  LMP 08/13/2011  Neck: Neck supple. No adenopathy. Thyroid somewhat diffusely enlarged  Lungs: clear to auscultation Heart: regular rate and rhythym Abdomen:  soft without significant tenderness, masses, organomegaly  Pelvic:EGBUS-wnl, vagina-2/4 cystocele and 3/4 rectocele with uterine descensus to 1/2 length of vagina, cervix-no lesions but ? scar tissue on anterior lip, uterus-normal size adnexae-no tenderness or masses Extremities:  no clubbing, cyanosis or edema   Assesment: Symptomatic Pelvic Relaxation                      Dysmenorrhea   Disposition:  A discussion was held with patient regarding the indication for her procedure(s) along with the risks, which include but are not limited to: reaction to anesthesia, damage to adjacent organs, infection, early menopause, pelvic prolapse and excessive bleeding.   CSN# [id number]   Alantis Bethune J. Chazlyn Cude, PA-C  for Dr. Rivard  

## 2011-09-08 NOTE — Anesthesia Postprocedure Evaluation (Signed)
Anesthesia Post Note  Patient: Joanne Mccarty  Procedure(s) Performed: Procedure(s) (LRB): HYSTERECTOMY VAGINAL (N/A) POSTERIOR REPAIR (RECTOCELE) ()  Anesthesia type: General  Patient location: PACU  Post pain: Pain level controlled  Post assessment: Post-op Vital signs reviewed  Last Vitals:  Filed Vitals:   09/08/11 1600  BP: 124/67  Pulse: 57  Temp:   Resp: 18    Post vital signs: Reviewed  Level of consciousness: sedated  Complications: No apparent anesthesia complications

## 2011-09-08 NOTE — Anesthesia Preprocedure Evaluation (Addendum)
Anesthesia Evaluation  Patient identified by MRN, date of birth, ID band Patient awake    Reviewed: Allergy & Precautions, H&P , Patient's Chart, lab work & pertinent test results, reviewed documented beta blocker date and time   History of Anesthesia Complications (+) PONV  Airway Mallampati: II TM Distance: >3 FB Neck ROM: full    Dental No notable dental hx.    Pulmonary  breath sounds clear to auscultation  Pulmonary exam normal       Cardiovascular Rhythm:regular Rate:Normal     Neuro/Psych    GI/Hepatic   Endo/Other    Renal/GU      Musculoskeletal   Abdominal   Peds  Hematology   Anesthesia Other Findings   Reproductive/Obstetrics                           Anesthesia Physical Anesthesia Plan  ASA: II  Anesthesia Plan: General   Post-op Pain Management:    Induction: Intravenous  Airway Management Planned: Oral ETT  Additional Equipment:   Intra-op Plan:   Post-operative Plan:   Informed Consent: I have reviewed the patients History and Physical, chart, labs and discussed the procedure including the risks, benefits and alternatives for the proposed anesthesia with the patient or authorized representative who has indicated his/her understanding and acceptance.   Dental Advisory Given and Dental advisory given  Plan Discussed with: CRNA and Surgeon  Anesthesia Plan Comments: (  Discussed  general anesthesia, including possible nausea, instrumentation of airway, sore throat,pulmonary aspiration, etc. I asked if the were any outstanding questions, or  concerns before we proceeded. )        Anesthesia Quick Evaluation  

## 2011-09-08 NOTE — Interval H&P Note (Signed)
History and Physical Interval Note:  09/08/2011 12:30 PM  Joanne Mccarty  has presented today for surgery, with the diagnosis of Symptomatic Rectocele, Pelvic Pressure, Anemia  The various methods of treatment have been discussed with the patient and family. After consideration of risks, benefits and other options for treatment, the patient has consented to  Procedure(s) (LRB): HYSTERECTOMY VAGINAL (N/A) ANTERIOR (CYSTOCELE) AND POSTERIOR REPAIR (RECTOCELE) (N/A) as a surgical intervention .  The patients' history has been reviewed, patient examined, no change in status, stable for surgery.  I have reviewed the patients' chart and labs.  Questions were answered to the patient's satisfaction.     Pike Scantlebury A

## 2011-09-08 NOTE — Discharge Instructions (Signed)
Call [facility] @ [phone] if:  You have a temperature greater than or equal to 100.4 degrees Farenheit orally You have pain that is not made better by the pain medication given and taken as directed You have excessive bleeding or problems urinating  Take Colace (Docusate Sodium/Stool Softener) 100 mg 2-3 times daily while taking narcotic pain medicine to avoid constipation or until bowel movements are regular. Take plain warm water Sitz baths 3 times a day for 20 minutes to aid in healing and for comfort   You may walk up steps You may shower tomorrow You may resume a regular diet Avoid anything in vagina for 6 weeks (or until after your post-operative visit)  Do not lift over 15 pounds for 6 weeks  Follow up with Dr. Estanislado Pandy September 30, 2011 at 10:30 a.m.

## 2011-09-08 NOTE — Op Note (Signed)
Preoperative diagnosis: Symptomatic pelvic prolapse with grade 3-4 rectocele  Postoperative diagnosis: Same  Anesthesia: Gen.  Anesthesiologist: Dr. Cristela Blue  Procedure: Total vaginal hysterectomy with posterior repair   Surgeon: Dr. Dois Davenport Marquelle Balow  Assistant: Henreitta Leber P.A.-C.  Estimated blood loss: 100 cc  Procedure:  After being informed of the planned procedure with possible complications including but not limited to bleeding, infection, injury to other organs namely bladder and rectum, need for laparotomy, informed consent is obtained and patient is taken to or #8. She is placed in the lithotomy position prepped and draped in a sterile fashion with knee-high sequential compressive devices. Pelvic exam reveals an anteverted normal-size uterus and 2 normal adnexa. We do have a uterine prolapse with 2-3/4 and a rectocele grade 3-4.  A Foley catheter is inserted in her bladder.  A weighted speculum is inserted in her vagina and the cervix is grasped with 2 Jacobs forceps. The vaginal mucosa is infiltrated with lidocaine 1% epinephrine 1 in 200,000. The vaginal mucosa is open in a circumferential way around the cervix with knife. We can bluntly dissect the anterior and posterior vaginal mucosa in order to isolate the posterior cul-de-sac which was sharply opened. Bladder is retracted anteriorly.  We isolate each uterosacral ligament with Aundria Rud forcep, sectioned him, sutured them with a transfix suture of 0 Vicryl For future suspension. Uterine vessels were then isolated with Rogers forceps on each side, sectioned and sutured with a transfix suture of 0 Vicryl. The anterior cul-de-sac is then sharply opened and the bladder is retracted even further. Broad ligament on each side is isolated with Rogers forceps, sectioned and sutured with a transfix suture of 0 Vicryl. We then delivered the fundus of the uterus via the posterior cul-de-sac and are able to isolate the last pedicle  including broad ligament utero-ovarian ligament tube and round ligament. Each pedicle is isolated with 2 Rogers clamps. Each pedicle is sectioned and sutured with 2 transfix suture of 0 Vicryl. The uterus is removed entirely with the cervix. Both ovaries are visualized and normal in appearance.  Hemostasis is completed on the left uterosacral ligament with a figure-of-eight stitch of 0 Vicryl. Hemostasis was now satisfactory and we proceed with suspension of the vagina. This is achieved with 2-0 Vicryl suture joining the posterior cul-de-sac, posterior peritoneum, uterosacral ligament, anterior peritoneum and posterior cul-de-sac. We now close the posterior cul-de-sac to prevent future enterocele with a 0 Vicryl suture joining the each uterosacral ligament to the posterior cul-de-sac peritoneum. All of the suspension sutures are then tied after insuring that bowel is fully retracted.  The vagina is then closed longitudinally using figure-of-eight stitches of 0 Vicryl. Hemostasis is adequate.  We proceed with posterior repair by grasping the posterior fourchette using 2 Allis clamps. A double V. incision is made after we have infiltrated with lidocaine 1% epinephrine 1 in 200,000. This gives [place] access to the perineal body and to the posterior vaginal mucosa. We can now dissect midline sharply and bluntly the vaginal mucosa from the prerectal fascia. This was done on 2 we reach 1 cm from the posterior vaginal cul-de-sac. Using sharp and blunt dissection we now isolated the prerectal fascia until we can completely correct the rectocele. Using 2-0 Vicryl U sutures we plicate the prerectal fascia in multiple layers to correct of the rectocele. Excess vaginal mucosa is excised. The posterior vaginal mucosa is then closed with a running lock suture of 3-0 Vicryl all to the hymenal ring. The perineal body is then closed  with simple sutures of 3-0 Vicryl and subcuticular suture of 3-0 Vicryl. Excess left labia menorah  is excised and is closed with interrupted sutures of 3-0 Vicryl.  At the end of our surgery, we have a normal vaginal opening with a strong perineal body endovaginal length of at least 10 cm. The vagina is then packed with a half inch vaginal packing with Estrace cream.  Instrument and sponge count is complete x2. Estimated blood loss is 100 cc. The procedure is well tolerated by the patient is taken to recovery room in a well and stable condition.  Specimen: Uterus to pathology

## 2011-09-09 ENCOUNTER — Encounter (HOSPITAL_COMMUNITY): Payer: Self-pay | Admitting: Obstetrics and Gynecology

## 2011-09-09 LAB — CBC
HCT: 32.5 % — ABNORMAL LOW (ref 36.0–46.0)
Hemoglobin: 10.8 g/dL — ABNORMAL LOW (ref 12.0–15.0)
MCHC: 33.2 g/dL (ref 30.0–36.0)
MCV: 92.9 fL (ref 78.0–100.0)
WBC: 12 10*3/uL — ABNORMAL HIGH (ref 4.0–10.5)

## 2011-09-09 MED ORDER — KETOROLAC TROMETHAMINE 10 MG PO TABS
10.0000 mg | ORAL_TABLET | Freq: Four times a day (QID) | ORAL | Status: DC
  Administered 2011-09-09 – 2011-09-10 (×4): 10 mg via ORAL
  Filled 2011-09-09 (×9): qty 1

## 2011-09-09 NOTE — Anesthesia Postprocedure Evaluation (Signed)
  Anesthesia Post-op Note  Patient: Joanne Mccarty  Procedure(s) Performed: Procedure(s) (LRB): HYSTERECTOMY VAGINAL (N/A) POSTERIOR REPAIR (RECTOCELE) ()  Patient Location: [facility]  Anesthesia Type: General  Level of Consciousness: awake  Airway and Oxygen Therapy: Patient Spontanous Breathing  Post-op Pain: none  Post-op Assessment: Post-op Vital signs reviewed  Post-op Vital Signs: Reviewed and stable  Complications: No apparent anesthesia complications

## 2011-09-09 NOTE — Progress Notes (Signed)
S: better. Nausea resolved. Tolerates normal diet although poor appetite. Pain well managed with PO Toradol.     C/O incapable of voiding. Has dribbled without full emptying. No dysuria. Minimal bleeding.  O: bedside bladder scan: 220 cc      In and out cath= 300 cc  A: post-op bladder dysfunction  P: reassurance     Pt to shower     Probable discharge in am

## 2011-09-09 NOTE — Addendum Note (Signed)
Addendum  created 09/09/11 1004 by Suella Grove, CRNA   Modules edited:Notes Section

## 2011-09-09 NOTE — Progress Notes (Signed)
1 Day Post-Op Procedure(s) (LRB): HYSTERECTOMY VAGINAL (N/A) POSTERIOR REPAIR (RECTOCELE) ()  Subjective: Patient reports that pain is well managed. Very uncomfortable with Foley. Perineum is feeling sore. 4/10 Tolerating clear liquids  diet without difficulty. No nausea / vomiting, finally resolved.  Ambulating and voiding.  Objective: BP 103/64  Pulse 79  Temp 98.8 F (37.1 C) (Oral)  Resp 18  Ht 5\' 6"  (1.676 m)  Wt 134 lb (60.782 kg)  BMI 21.63 kg/m2  SpO2 99%  LMP 08/13/2011 Lungs: clear Heart: normal rate and rhythm Abdomen:soft and non tender Extremities: Homans sign is negative, no sign of DVT   Assessment: s/p Procedure(s): HYSTERECTOMY VAGINAL POSTERIOR REPAIR (RECTOCELE): progressing well  Plan: Advance diet Encourage ambulation Advance to PO medication Sitz bath  LOS: 1 day    Terica Yogi A 09/09/2011, 8:42 AM

## 2011-09-10 MED ORDER — BETHANECHOL CHLORIDE 10 MG PO TABS: 10.0000 mg | ORAL_TABLET | Freq: Three times a day (TID) | ORAL | Status: DC

## 2011-09-10 MED ORDER — BETHANECHOL CHLORIDE 10 MG PO TABS
10.0000 mg | ORAL_TABLET | ORAL | Status: DC
  Administered 2011-09-10 (×2): 10 mg via ORAL
  Filled 2011-09-10 (×5): qty 1

## 2011-09-10 MED ORDER — OXYCODONE-ACETAMINOPHEN 5-325 MG PO TABS: 1.0000 | ORAL_TABLET | ORAL | Status: AC | PRN

## 2011-09-10 MED ORDER — KETOROLAC TROMETHAMINE 10 MG PO TABS: 10.0000 mg | ORAL_TABLET | Freq: Four times a day (QID) | ORAL | Status: AC

## 2011-09-10 NOTE — Discharge Summary (Signed)
Physician Discharge Summary  Patient ID: Joanne Mccarty MRN: [id number] DOB/AGE: Jul 04, 1965 46 y.o.  Admit date: 09/08/2011 Discharge date: 09/10/2011  Admission Diagnoses: Symptomatic Rectocele, Pelvic Pressure, Anemia  Discharge Diagnoses: Symptomatic Rectocele, Pelvic Pressure, Anemia        Active Problems:    Discharged Condition: good  Hospital Course: Urinary retention post-op resolved with Urecholine  Consults: None  Treatments: surgery: TVH and posterior repair  Disposition:   Discharge Orders    Future Appointments: Provider: Department: Dept Phone: Center:   09/30/2011 10:30 AM Esmeralda Arthur, MD [facility] [phone] None     Future Orders Please Complete By Expires   Discharge patient        Medication List  As of 09/10/2011 10:48 AM   STOP taking these medications         drospirenone-ethinyl estradiol 3-0.02 MG tablet      LOESTRIN FE 1/20 1-20 MG-MCG tablet         TAKE these medications         acetaminophen 500 MG tablet   Commonly known as: TYLENOL   Take 1,000 mg by mouth every 6 (six) hours as needed. For pain      albuterol 108 (90 BASE) MCG/ACT inhaler   Commonly known as: PROVENTIL HFA;VENTOLIN HFA   Inhale 1-2 puffs into the lungs every 6 (six) hours as needed. For exercise induced asthma symptoms.      bethanechol 10 MG tablet   Commonly known as: URECHOLINE   Take 1 tablet (10 mg total) by mouth 3 (three) times daily.      ibuprofen 400 MG tablet   Commonly known as: ADVIL,MOTRIN   Take 400 mg by mouth every 6 (six) hours as needed. For pain      ketorolac 10 MG tablet   Commonly known as: TORADOL   Take 1 tablet (10 mg total) by mouth every 6 (six) hours.      oxyCODONE-acetaminophen 5-325 MG per tablet   Commonly known as: PERCOCET   Take 1 tablet by mouth every 4 (four) hours as needed for pain.      SYNTHROID 88 MCG tablet   Generic drug: levothyroxine   Take 1 tablet (88 mcg total) by mouth daily.      XANAX 0.25 MG  tablet   Generic drug: ALPRAZolam   Take 0.25 mg by mouth 3 (three) times daily as needed.           Follow-up Information    Follow up with [facility] A, MD on 09/30/2011. (Appointment time 10:30 a.m.)    Contact information:   [address] [place] [address] [phone]          Signed: Esmeralda Arthur, MD 09/10/2011, 10:48 AM

## 2011-09-10 NOTE — Progress Notes (Signed)
Pt is discharged in the present of husband Downstairs per ambulatory.. Stable. Pt to continue with medication for bladder.Voiding has be come alittle easlier now.Marland KitchenSpirits are good. Comphrended all dsicharge instructions well Questions were asked and answered.Marland Kitchen

## 2011-09-10 NOTE — Progress Notes (Signed)
Pt post void residual via bladder scan 300, notified MD

## 2011-09-10 NOTE — Progress Notes (Signed)
Pt nauseated throughout the night.  U.O marginal with poor PO intake.  Pt denies c/o, will leave foley catheter in place for now.  Pt agrees, does not desire foley out at the present time.

## 2011-09-10 NOTE — Progress Notes (Addendum)
Joanne Mccarty is a57 y.o.  [id number]  Post Op Date # 2 ( TVH/Posterior Colporrhaphy  Subjective: Patient is Postoperative course complicated by urinary retention. Passing flatus and tolerating a regular diet. Patient has soreness of perineum but no other complaints.,  Activity: ambulating without difficulty  Objective: Vital signs in last 24 hours: Temp:  [98.1 F (36.7 C)-98.7 F (37.1 C)] 98.7 F (37.1 C) (06/13 0525) Pulse Rate:  [52-71] 71  (06/13 0525) Resp:  [17-18] 18  (06/13 0525) BP: (95-118)/(57-72) 106/65 mmHg (06/13 0525) SpO2:  [99 %-100 %] 99 % (06/13 0525)  Intake/Output from previous day: 06/12 0701 - 06/13 0700 In: 1960 [P.O.:1960] Out: 2700 [Urine:2700] Intake/Output this shift:    Lab 09/09/11 0510 09/03/11 1450  WBC 12.0* 8.5  HGB 10.8* 13.0  HCT 32.5* 39.5  PLT 195 245    No results found for this basename: NA:3,K:3,CL:3,CO2:3,BUN:3,CREATININE:3,CALCIUM:3,LABALBU:3,PROT:3,BILITOT:3,ALKPHOS:3,ALT:3,AST:3,GLUCOSE:3 in the last 168 hours  EXAM: General: alert, cooperative and no distress Resp: clear to auscultation bilaterally Cardio: regular rate and rhythm, S1, S2 normal, no murmur, click, rub or gallop GI: soft, non-tender; bowel sounds normal; no masses,  no organomegaly Extremities: extremities normal, atraumatic, no cyanosis or edema   Assessment: s/p Procedure(s): HYSTERECTOMY VAGINAL POSTERIOR REPAIR (RECTOCELE): stable, anemia and urinary retention  Plan: To be given Urecholine 10 mg hourly until patient voids adequately (but will not exceed 5 doses) Routine care  LOS: 2 days    POWELL,ELMIRA, PA-C 09/10/2011 8:26 AM    10:30  Has voided 500 cc with 250cc residual. Voided again without difficulty Feels like complete emptying Will discharge home with Urecholine TID for 2 days Discharge instructions reviewed

## 2011-09-11 ENCOUNTER — Telehealth: Payer: Self-pay | Admitting: Obstetrics and Gynecology

## 2011-09-11 DIAGNOSIS — B373 Candidiasis of vulva and vagina: Secondary | ICD-10-CM

## 2011-09-11 DIAGNOSIS — R339 Retention of urine, unspecified: Secondary | ICD-10-CM

## 2011-09-11 MED ORDER — NYSTATIN-TRIAMCINOLONE 100000-0.1 UNIT/GM-% EX OINT: TOPICAL_OINTMENT | Freq: Three times a day (TID) | CUTANEOUS | Status: DC | PRN

## 2011-09-11 MED ORDER — BETHANECHOL CHLORIDE 10 MG PO TABS: ORAL_TABLET | ORAL | Status: DC

## 2011-09-11 MED ORDER — FLUCONAZOLE 150 MG PO TABS: 150.0000 mg | ORAL_TABLET | Freq: Once | ORAL | Status: AC

## 2011-09-11 NOTE — Progress Notes (Signed)
Post discharge chart review completed.  

## 2011-09-11 NOTE — Telephone Encounter (Signed)
Pt called. Still can not empty bladder completely despite Urecholine 20 mg TID. S/P TVH with posterior repair 09/08/11. Pain is well controlled with current pain meds. Doing Sitz baths as instructed. Also c/o itching at introitus.  Recommend take Urecholine 10 mg every 1 hour until complete emptying of bladder ( max 50 mg) Then will up to 30 mg TID. Offered to come in for Foley drainage for 24-48 hours: declined.  Orders in e-prescribe  Silverio Lay MD

## 2011-09-14 ENCOUNTER — Telehealth: Payer: Self-pay | Admitting: Obstetrics and Gynecology

## 2011-09-14 NOTE — Telephone Encounter (Signed)
Triage/epic 

## 2011-09-15 ENCOUNTER — Telehealth: Payer: Self-pay | Admitting: Obstetrics and Gynecology

## 2011-09-15 ENCOUNTER — Other Ambulatory Visit: Payer: Self-pay | Admitting: Obstetrics and Gynecology

## 2011-09-15 NOTE — Telephone Encounter (Signed)
TC to pt.  States is not sure she is urinating enough but does not feel that she is retaining urine. Drinking 6 glasses water/day.  Feels uncomfortable in pubic area around urethra.  Feels better when in the process of urinating.  D/C's Urecholine 09/14/11.  No fever.  Also states that even though stool is soft, feels as though is straining to expel.   Per DR AR, pt to have Chem 10 and urine culture. To increase fiber and stool softener to prevent any straining.   To call if any increase in sx or desires eval.  Pt verbalizes comprehension.

## 2011-09-15 NOTE — Telephone Encounter (Signed)
Triage/gen. [facility]. 

## 2011-09-16 ENCOUNTER — Ambulatory Visit (INDEPENDENT_AMBULATORY_CARE_PROVIDER_SITE_OTHER): Payer: PRIVATE HEALTH INSURANCE | Admitting: Obstetrics and Gynecology

## 2011-09-16 ENCOUNTER — Other Ambulatory Visit (INDEPENDENT_AMBULATORY_CARE_PROVIDER_SITE_OTHER): Payer: PRIVATE HEALTH INSURANCE

## 2011-09-16 ENCOUNTER — Encounter: Payer: Self-pay | Admitting: Obstetrics and Gynecology

## 2011-09-16 VITALS — BP 100/70 | Temp 99.0°F | Wt 137.0 lb

## 2011-09-16 DIAGNOSIS — R112 Nausea with vomiting, unspecified: Secondary | ICD-10-CM

## 2011-09-16 DIAGNOSIS — R300 Dysuria: Secondary | ICD-10-CM

## 2011-09-16 DIAGNOSIS — Z9889 Other specified postprocedural states: Secondary | ICD-10-CM | POA: Insufficient documentation

## 2011-09-16 LAB — POCT URINALYSIS DIPSTICK
Ketones, UA: NEGATIVE
Nitrite, UA: NEGATIVE
Protein, UA: NEGATIVE
pH, UA: 6

## 2011-09-16 LAB — URINE CULTURE: Colony Count: NO GROWTH

## 2011-09-16 MED ORDER — CIPROFLOXACIN HCL 500 MG PO TABS: 250.0000 mg | ORAL_TABLET | Freq: Two times a day (BID) | ORAL | Status: AC

## 2011-09-16 NOTE — Progress Notes (Signed)
45 YO 1 week post op from a TVH/Posterior Repair that was mildly complicated by post op urinary retention. Six days ago experienced  fever x  24 hours. Denies any myalgias, gastrointestinal symptoms,  or flank pain. Fever resolved but2 days later  began to have discomfort with urination to include dysuria.  Patient reports that things feel "tight" in the area she's had surgery.   O: EGBUS: left labia mildly edematous and mildly tender; right labia-normal without tenderness, midline incisions posteriorly are intact with a single frayed suture, no evidence of infection.   A: UTI     S/P TVH/Posterior Repair     H/O Urinary Retention  P: Cipro 250 mg # 14 bid x 7 days       Urine for culture       Uribel # 6 tid x 3 days;  will turn urine blue       RTO-post op exam or prn  Kinneth Fujiwara, PA-C

## 2011-09-21 ENCOUNTER — Telehealth: Payer: Self-pay | Admitting: Obstetrics and Gynecology

## 2011-09-21 NOTE — Telephone Encounter (Signed)
Spoke with pt rgd msg pt wants urine cx results informed urine cx negative pt voice understanding

## 2011-09-21 NOTE — Telephone Encounter (Signed)
Triage/epic lab res

## 2011-09-24 ENCOUNTER — Inpatient Hospital Stay (HOSPITAL_COMMUNITY)
Admission: AD | Admit: 2011-09-24 | Discharge: 2011-09-24 | Disposition: A | Discharge status: Home / Self Care | Payer: PRIVATE HEALTH INSURANCE | Source: Ambulatory Visit | Attending: Obstetrics and Gynecology | Admitting: Obstetrics and Gynecology

## 2011-09-24 ENCOUNTER — Encounter (HOSPITAL_COMMUNITY): Payer: Self-pay | Admitting: Obstetrics and Gynecology

## 2011-09-24 DIAGNOSIS — R5082 Postprocedural fever: Secondary | ICD-10-CM | POA: Insufficient documentation

## 2011-09-24 DIAGNOSIS — R51 Headache: Secondary | ICD-10-CM | POA: Insufficient documentation

## 2011-09-24 DIAGNOSIS — R509 Fever, unspecified: Secondary | ICD-10-CM

## 2011-09-24 LAB — CBC WITH DIFFERENTIAL/PLATELET
Basophils Absolute: 0 10*3/uL (ref 0.0–0.1)
Eosinophils Absolute: 0.1 10*3/uL (ref 0.0–0.7)
Eosinophils Relative: 1 % (ref 0–5)
HCT: 36.3 % (ref 36.0–46.0)
MCH: 30.8 pg (ref 26.0–34.0)
MCHC: 33.6 g/dL (ref 30.0–36.0)
MCV: 91.7 fL (ref 78.0–100.0)
Monocytes Absolute: 0.9 10*3/uL (ref 0.1–1.0)
Platelets: 287 10*3/uL (ref 150–400)
RDW: 13.3 % (ref 11.5–15.5)

## 2011-09-24 LAB — URINALYSIS, ROUTINE W REFLEX MICROSCOPIC
Bilirubin Urine: NEGATIVE
Glucose, UA: NEGATIVE mg/dL
Ketones, ur: NEGATIVE mg/dL
Protein, ur: NEGATIVE mg/dL
Urobilinogen, UA: 0.2 mg/dL (ref 0.0–1.0)

## 2011-09-24 LAB — URINE MICROSCOPIC-ADD ON

## 2011-09-24 MED ORDER — CIPROFLOXACIN HCL 500 MG PO TB24: 500.0000 mg | ORAL_TABLET | Freq: Two times a day (BID) | ORAL | Status: DC

## 2011-09-24 NOTE — Discharge Instructions (Signed)
Fever, Adult A fever is a higher than normal body temperature. In an adult, an oral temperature around 98.6 F (37 C) is considered normal. A temperature of 100.4 F (38 C) or higher is generally considered a fever. Mild or moderate fevers generally have no long-term effects and often do not require treatment. Extreme fever (greater than or equal to 106 F or 41.1 C) can cause seizures. The sweating that may occur with repeated or prolonged fever may cause dehydration. Elderly people can develop confusion during a fever. A measured temperature can vary with:  Age.   Time of day.   Method of measurement (mouth, underarm, rectal, or ear).  The fever is confirmed by taking a temperature with a thermometer. Temperatures can be taken different ways. Some methods are accurate and some are not.  An oral temperature is used most commonly. Electronic thermometers are fast and accurate.   An ear temperature will only be accurate if the thermometer is positioned as recommended by the manufacturer.   A rectal temperature is accurate and done for those adults who have a condition where an oral temperature cannot be taken.   An underarm (axillary) temperature is not accurate and not recommended.  Fever is a symptom, not a disease.  CAUSES   Infections commonly cause fever.   Some noninfectious causes for fever include:   Some arthritis conditions.   Some thyroid or adrenal gland conditions.   Some immune system conditions.   Some types of cancer.   A medicine reaction.   High doses of certain street drugs such as methamphetamine.   Dehydration.   Exposure to high outside or room temperatures.   Occasionally, the source of a fever cannot be determined. This is sometimes called a "fever of unknown origin" (FUO).   Some situations may lead to a temporary rise in body temperature that may go away on its own. Examples are:   Childbirth.   Surgery.   Intense exercise.  HOME CARE  INSTRUCTIONS   Take appropriate medicines for fever. Follow dosing instructions carefully. If you use acetaminophen to reduce the fever, be careful to avoid taking other medicines that also contain acetaminophen. Do not take aspirin for a fever if you are younger than age 19. There is an association with Reye's syndrome. Reye's syndrome is a rare but potentially deadly disease.   If an infection is present and antibiotics have been prescribed, take them as directed. Finish them even if you start to feel better.   Rest as needed.   Maintain an adequate fluid intake. To prevent dehydration during an illness with prolonged or recurrent fever, you may need to drink extra fluid.Drink enough fluids to keep your urine clear or pale yellow.   Sponging or bathing with room temperature water may help reduce body temperature. Do not use ice water or alcohol sponge baths.   Dress comfortably, but do not over-bundle.  SEEK MEDICAL CARE IF:   You are unable to keep fluids down.   You develop vomiting or diarrhea.   You are not feeling at least partly better after 3 days.   You develop new symptoms or problems.  SEEK IMMEDIATE MEDICAL CARE IF:   You have shortness of breath or trouble breathing.   You develop excessive weakness.   You are dizzy or you faint.   You are extremely thirsty or you are making little or no urine.   You develop new pain that was not there before (such as in the head,   neck, chest, back, or abdomen).   You have persistant vomiting and diarrhea for more than 1 to 2 days.   You develop a stiff neck or your eyes become sensitive to light.   You develop a skin rash.   You have a fever or persistent symptoms for more than 2 to 3 days.   You have a fever and your symptoms suddenly get worse.  MAKE SURE YOU:   Understand these instructions.   Will watch your condition.   Will get help right away if you are not doing well or get worse.  Document Released:  09/09/2000 Document Revised: 03/05/2011 Document Reviewed: 01/15/2011 [employer] Patient Information 2012 [employer]. 

## 2011-09-24 NOTE — MAU Provider Note (Signed)
History   Eriyah Fernando is a 45y.o. MWF P3003 who presents 16d Post-op s/p TVH and posterior repair for eval of fever=101.6 this evening.  Pt called to report temp around 1940, and took 1gm of Tylenol around 2000.  Per pt, immediate PP course complicated by "urinary retention," and she was trx'd w/ Urecholine w/ improvement.  Seen by P.A. 1 week ago and urine cx sent and was negative, however started on Cipro until cx result back, so wound up taking 3-4d course of Abx before she stopped it.   Also reports a headache this evening, and some mild lower abdominal cramping (points to suprapubic area).  She states she has just felt "lousy," today, and has had decreased appetite last few days.  Reports "5lb" weight loss in the past week.  Nml BM since sx.  Took a "toradol" this morning for pain, but had not been taking regularly.  Reports a "yellowish" d/c, but no VB.   Currently pt is a SAHM.  Leaving for beach in about a week.   Of mention, pt does report hx of fever in previous Post-operative periods.  CSN: [id number]  Arrival date and time: 09/24/11 2048   First Provider Initiated Contact with Patient 09/24/11 2145      Chief Complaint  Patient presents with  . Fever  . Abdominal Pain  . Post-op Problem   HPI  OB History    Grav Para Term Preterm Abortions TAB SAB Ect Mult Living   3 3 3       3       Past Medical History  Diagnosis Date  . History of chickenpox   . Abdominal bruit     fem neg doppler exam  . Hypothyroidism   . Anemia   . History of hypertension 1998    resolved  . Asthma     exercise induced-weather  . PONV (postoperative nausea and vomiting)     Past Surgical History  Procedure Date  . Dysplastic lesion      x 3 4/08  . Endometrial ablation april 2009  . Breast surgery     left bx  . Novasure ablation   . Dilation and curettage of uterus 12/2007  . Robotic cystectomy 02/2010  . Vaginal hysterectomy 09/08/2011    Procedure: HYSTERECTOMY VAGINAL;  Surgeon:  Esmeralda Arthur, MD;  Location: [facility];  Service: Gynecology;  Laterality: N/A;  . Rectocele repair 09/08/2011    Procedure: POSTERIOR REPAIR (RECTOCELE);  Surgeon: Esmeralda Arthur, MD;  Location: [facility];  Service: Gynecology;;    Family History  Problem Relation Age of Onset  . Hypertension Mother   . Melanoma Mother   . Hyperlipidemia Mother   . Crohn's disease Brother   . Hypothyroidism Son     2011  . Diabetes Maternal Grandfather   . Colon cancer Neg Hx     History  Substance Use Topics  . Smoking status: Former Smoker -- 5 years    Types: Cigarettes    Quit date: 09/03/1987  . Smokeless tobacco: Never Used  . Alcohol Use: 8.4 oz/week    14 Glasses of wine per week    Allergies:  Allergies  Allergen Reactions  . Cephalexin     REACTION: rash    Prescriptions prior to admission  Medication Sig Dispense Refill  . acetaminophen (TYLENOL) 500 MG tablet Take 1,000 mg by mouth every 6 (six) hours as needed. For pain, fever      . albuterol (PROVENTIL HFA;VENTOLIN  HFA) 108 (90 BASE) MCG/ACT inhaler Inhale 1-2 puffs into the lungs every 6 (six) hours as needed. For exercise induced asthma symptoms.      Marland Kitchen ibuprofen (ADVIL,MOTRIN) 400 MG tablet Take 400 mg by mouth every 6 (six) hours as needed. For pain      . nystatin-triamcinolone ointment (MYCOLOG) Apply topically 3 (three) times daily as needed.  60 g  0  . oxyCODONE-acetaminophen (PERCOCET) 5-325 MG per tablet Take 1 tablet by mouth every 4 (four) hours as needed. pain      . SYNTHROID 88 MCG tablet Take 1 tablet (88 mcg total) by mouth daily.  90 tablet  3  . DISCONTD: bethanechol (URECHOLINE) 10 MG tablet Take 1 tablet by mouth every 1 hour until complete emptying of bladder for a maximum of 5 doses.Then take 3 tablets by mouth 3 times daily  30 tablet  1  . DISCONTD: ketorolac (TORADOL) 30 MG/ML injection Inject 30 mg into the vein once. Abdominal pain      . ALPRAZolam (XANAX) 0.25 MG tablet Take 0.25 mg by mouth 3  (three) times daily as needed.          Review of Systems  Constitutional: Positive for fever, chills, weight loss and malaise/fatigue.  Eyes: Negative.   Respiratory: Negative.   Cardiovascular: Negative.   Gastrointestinal: Positive for abdominal pain.       Cramping lower abdomen  Genitourinary: Positive for frequency and flank pain.       C/o focal area of tenderness Lt mid-back; frequency in post-op period; no more today than usual  Musculoskeletal: Negative.   Skin: Negative.   Neurological: Positive for headaches.   Physical Exam   Blood pressure 143/62, pulse 106, temperature 100.2 F (37.9 C), temperature source Oral, height 5\' 6"  (1.676 m), weight 132 lb 6.4 oz (60.056 kg), last menstrual period 08/13/2011, SpO2 100.00%. .. Filed Vitals:   09/24/11 2055 09/24/11 2149 09/24/11 2249  BP: 143/62  129/75  Pulse: 106  92  Temp: 100.8 F (38.2 C) 100.2 F (37.9 C)   TempSrc: Oral Oral   Height: 5\' 6"  (1.676 m)    Weight: 132 lb 6.4 oz (60.056 kg)    SpO2: 100%    .Marland Kitchen Results for orders placed during the hospital encounter of 09/24/11 (from the past 24 hour(s))  URINALYSIS, ROUTINE W REFLEX MICROSCOPIC     Status: Abnormal   Collection Time   09/24/11  9:00 PM      Component Value Range   Color, Urine YELLOW  YELLOW   APPearance CLEAR  CLEAR   Specific Gravity, Urine 1.010  1.005 - 1.030   pH 7.5  5.0 - 8.0   Glucose, UA NEGATIVE  NEGATIVE mg/dL   Hgb urine dipstick TRACE (*) NEGATIVE   Bilirubin Urine NEGATIVE  NEGATIVE   Ketones, ur NEGATIVE  NEGATIVE mg/dL   Protein, ur NEGATIVE  NEGATIVE mg/dL   Urobilinogen, UA 0.2  0.0 - 1.0 mg/dL   Nitrite NEGATIVE  NEGATIVE   Leukocytes, UA SMALL (*) NEGATIVE  URINE MICROSCOPIC-ADD ON     Status: Abnormal   Collection Time   09/24/11  9:00 PM      Component Value Range   Squamous Epithelial / LPF FEW (*) RARE   WBC, UA 3-6  <3 WBC/hpf   Bacteria, UA RARE  RARE  CBC WITH DIFFERENTIAL     Status: Abnormal   Collection  Time   09/24/11  9:23 PM  Component Value Range   WBC 11.8 (*) 4.0 - 10.5 K/uL   RBC 3.96  3.87 - 5.11 MIL/uL   Hemoglobin 12.2  12.0 - 15.0 g/dL   HCT 11.9  14.7 - 82.9 %   MCV 91.7  78.0 - 100.0 fL   MCH 30.8  26.0 - 34.0 pg   MCHC 33.6  30.0 - 36.0 g/dL   RDW 56.2  13.0 - 86.5 %   Platelets 287  150 - 400 K/uL   Neutrophils Relative 81 (*) 43 - 77 %   Neutro Abs 9.6 (*) 1.7 - 7.7 K/uL   Lymphocytes Relative 10 (*) 12 - 46 %   Lymphs Abs 1.2  0.7 - 4.0 K/uL   Monocytes Relative 7  3 - 12 %   Monocytes Absolute 0.9  0.1 - 1.0 K/uL   Eosinophils Relative 1  0 - 5 %   Eosinophils Absolute 0.1  0.0 - 0.7 K/uL   Basophils Relative 0  0 - 1 %   Basophils Absolute 0.0  0.0 - 0.1 K/uL   Physical Exam  Constitutional: She is oriented to person, place, and time. She appears well-developed and well-nourished. No distress.       Skin warm and sweaty  HENT:  Head: Normocephalic.  Eyes: Pupils are equal, round, and reactive to light.  Cardiovascular: Normal rate and regular rhythm.   Respiratory: Effort normal and breath sounds normal.  GI: Soft. She exhibits no distension and no mass. There is no tenderness. There is no rebound and no guarding.  Genitourinary:       Inspection only:  Vulva and perineum WNL; no bruising.  Blue suture noted on Lt labia.  No odor.  Scant clear d/c at introitus.  Musculoskeletal: She exhibits no edema.       No CVAT  Neurological: She is alert and oriented to person, place, and time.  Skin: Skin is warm and dry.    MAU Course  Procedures 1. U/a 2.  Cbc w/ diff 3.  Post-void residual  Assessment and Plan  1.  Fever 16d Post-op w/ previous h/o fever in Post-op period after prior sx's 2.  Headache 3.  Diff dx:  Viral syndrome vs. Cellulitis vs. Inflammatory response r/t healing process post-op 4.  Stable CBC and u/a WNL 5.  S/p TVH & posterior repair  1.  Per c/w dr. Estanislado Pandy x2; d/c home on Ciprofloxacin 500mg  po bid x7d and Motrin 600mg  po q6hr  ATC x48hrs. 2.  Rec'd daily probiotic.  D/c Toradol.  Increase rest and per MD, 80oz of fluid/day and 3 well-rounded meals per day 3.  F/u if s/s worsen, or prn.  Keep f/u appt 09/29/12 w/ dr. Estanislado Pandy as well.  Cadel Stairs H 09/24/2011, 10:42 PM

## 2011-09-24 NOTE — MAU Note (Signed)
Pt post TVH 2 wks ago, having lower abd cramping x 2 days and fever of 101.6 today.

## 2011-09-25 ENCOUNTER — Inpatient Hospital Stay (HOSPITAL_COMMUNITY)
Admission: AD | Admit: 2011-09-25 | Discharge: 2011-09-28 | DRG: 690 | Disposition: A | Discharge status: Home / Self Care | Payer: PRIVATE HEALTH INSURANCE | Source: Ambulatory Visit | Attending: Obstetrics and Gynecology | Admitting: Obstetrics and Gynecology

## 2011-09-25 ENCOUNTER — Inpatient Hospital Stay (HOSPITAL_COMMUNITY): Payer: PRIVATE HEALTH INSURANCE

## 2011-09-25 ENCOUNTER — Encounter (HOSPITAL_COMMUNITY): Payer: Self-pay

## 2011-09-25 ENCOUNTER — Other Ambulatory Visit: Payer: Self-pay | Admitting: Obstetrics and Gynecology

## 2011-09-25 DIAGNOSIS — N12 Tubulo-interstitial nephritis, not specified as acute or chronic: Secondary | ICD-10-CM

## 2011-09-25 DIAGNOSIS — N10 Acute tubulo-interstitial nephritis: Principal | ICD-10-CM | POA: Diagnosis present

## 2011-09-25 DIAGNOSIS — N949 Unspecified condition associated with female genital organs and menstrual cycle: Secondary | ICD-10-CM | POA: Diagnosis present

## 2011-09-25 LAB — CBC WITH DIFFERENTIAL/PLATELET
Basophils Absolute: 0 10*3/uL (ref 0.0–0.1)
Eosinophils Absolute: 0.1 10*3/uL (ref 0.0–0.7)
Eosinophils Relative: 0 % (ref 0–5)
HCT: 38.7 % (ref 36.0–46.0)
Lymphocytes Relative: 7 % — ABNORMAL LOW (ref 12–46)
Lymphs Abs: 1.2 10*3/uL (ref 0.7–4.0)
MCH: 31.1 pg (ref 26.0–34.0)
MCV: 91.9 fL (ref 78.0–100.0)
Monocytes Absolute: 1.3 10*3/uL — ABNORMAL HIGH (ref 0.1–1.0)
Platelets: 256 10*3/uL (ref 150–400)
RDW: 13.7 % (ref 11.5–15.5)
WBC: 16.4 10*3/uL — ABNORMAL HIGH (ref 4.0–10.5)

## 2011-09-25 LAB — COMPREHENSIVE METABOLIC PANEL
CO2: 25 mEq/L (ref 19–32)
Calcium: 9.1 mg/dL (ref 8.4–10.5)
Creatinine, Ser: 1 mg/dL (ref 0.50–1.10)
GFR calc Af Amer: 78 mL/min — ABNORMAL LOW (ref >90)
GFR calc non Af Amer: 67 mL/min — ABNORMAL LOW (ref >90)
Glucose, Bld: 108 mg/dL — ABNORMAL HIGH (ref 70–99)
Sodium: 135 mEq/L (ref 135–145)
Total Protein: 7.3 g/dL (ref 6.0–8.3)

## 2011-09-25 MED ORDER — ACETAMINOPHEN 500 MG PO TABS
1000.0000 mg | ORAL_TABLET | Freq: Once | ORAL | Status: AC
  Administered 2011-09-25: 1000 mg via ORAL
  Filled 2011-09-25: qty 2

## 2011-09-25 MED ORDER — LACTATED RINGERS IV SOLN
INTRAVENOUS | Status: DC
  Administered 2011-09-25: 23:00:00 via INTRAVENOUS

## 2011-09-25 MED ORDER — IOHEXOL 300 MG/ML  SOLN
100.0000 mL | Freq: Once | INTRAMUSCULAR | Status: AC | PRN
  Administered 2011-09-25: 100 mL via INTRAVENOUS

## 2011-09-25 NOTE — MAU Note (Signed)
Patient complaining of pain with IV. IV infusing with no redness or swelling. Joanne O RN at bedside to evaluate IV site. Patient states IV is feeling better and is fine.

## 2011-09-25 NOTE — MAU Provider Note (Signed)
History     CSN: [id number]  Arrival date and time: 09/25/11 1951   None     Chief Complaint  Patient presents with  . Fever  . Abdominal Pain  . Back Pain   HPI Comments: Pt is a 46yo s/p TVH and posterior repair 17days ago w Dr Estanislado Pandy. Was seen in MAU yesterday for fever. WBC count than was 11, she was dc'd home w instructions to take motrin ATC for 48hrs.  Today she has not felt well, dec appetite, and max temp 102.4 this afternoon. She last took motrin about 63pm. She c/o lower abd cramping, worse when sitting up. She declines any pain medication. She is voiding without difficulty, and reports no BM for about 2 days, but stats she's not been eating much.   Fever  This is a recurrent problem. The maximum temperature noted was 102 to 102.9 F. The temperature was taken using an oral thermometer. Associated symptoms include abdominal pain. Pertinent negatives include no nausea or vomiting. She has tried NSAIDs for the symptoms. The treatment provided mild relief.  Abdominal Pain Associated symptoms include a fever. Pertinent negatives include no nausea or vomiting.  Back Pain Associated symptoms include abdominal pain and a fever.     Past Medical History  Diagnosis Date  . History of chickenpox   . Abdominal bruit     fem neg doppler exam  . Hypothyroidism   . Anemia   . History of hypertension 1998    resolved  . Asthma     exercise induced-weather  . PONV (postoperative nausea and vomiting)     Past Surgical History  Procedure Date  . Dysplastic lesion      x 3 4/08  . Endometrial ablation april 2009  . Breast surgery     left bx  . Novasure ablation   . Dilation and curettage of uterus 12/2007  . Robotic cystectomy 02/2010  . Vaginal hysterectomy 09/08/2011    Procedure: HYSTERECTOMY VAGINAL;  Surgeon: Esmeralda Arthur, MD;  Location: [facility];  Service: Gynecology;  Laterality: N/A;  . Rectocele repair 09/08/2011    Procedure: POSTERIOR REPAIR (RECTOCELE);   Surgeon: Esmeralda Arthur, MD;  Location: [facility];  Service: Gynecology;;    Family History  Problem Relation Age of Onset  . Hypertension Mother   . Melanoma Mother   . Hyperlipidemia Mother   . Crohn's disease Brother   . Hypothyroidism Son     2011  . Diabetes Maternal Grandfather   . Colon cancer Neg Hx     History  Substance Use Topics  . Smoking status: Former Smoker -- 5 years    Types: Cigarettes    Quit date: 09/03/1987  . Smokeless tobacco: Never Used  . Alcohol Use: 8.4 oz/week    14 Glasses of wine per week    Allergies:  Allergies  Allergen Reactions  . Cephalexin     REACTION: rash    Prescriptions prior to admission  Medication Sig Dispense Refill  . acetaminophen (TYLENOL) 500 MG tablet Take 1,000 mg by mouth every 6 (six) hours as needed. For pain, fever      . ciprofloxacin (CIPRO XR) 500 MG 24 hr tablet Take 1 tablet (500 mg total) by mouth 2 (two) times daily.  14 tablet  0  . ibuprofen (ADVIL,MOTRIN) 400 MG tablet Take 400 mg by mouth every 6 (six) hours as needed. For pain      . nystatin-triamcinolone ointment (MYCOLOG) Apply topically 3 (three)  times daily as needed.  60 g  0  . oxyCODONE-acetaminophen (PERCOCET) 5-325 MG per tablet Take 1 tablet by mouth every 4 (four) hours as needed. pain      . SYNTHROID 88 MCG tablet Take 1 tablet (88 mcg total) by mouth daily.  90 tablet  3  . albuterol (PROVENTIL HFA;VENTOLIN HFA) 108 (90 BASE) MCG/ACT inhaler Inhale 1-2 puffs into the lungs every 6 (six) hours as needed. For exercise induced asthma symptoms.        Review of Systems  Constitutional: Positive for fever and malaise/fatigue.  Gastrointestinal: Positive for abdominal pain. Negative for nausea and vomiting.       Dec appetite  Musculoskeletal: Positive for back pain.  All other systems reviewed and are negative.   Physical Exam   Blood pressure 125/72, pulse 104, temperature 99.8 F (37.7 C), temperature source Oral, resp. rate 18, height  5\' 6"  (1.676 m), weight 130 lb 4 oz (59.081 kg), last menstrual period 08/13/2011.  Physical Exam  Nursing note and vitals reviewed. Constitutional: She is oriented to person, place, and time. She appears well-developed and well-nourished. No distress.  HENT:  Head: Normocephalic.  Neck: Normal range of motion.  Cardiovascular: Normal rate, regular rhythm and normal heart sounds.        Slightly tachycardic  Respiratory: Effort normal and breath sounds normal.  GI: Soft. Bowel sounds are normal. She exhibits no distension and no mass. There is no tenderness. There is no rebound and no guarding.  Genitourinary: Vaginal discharge found.       EGBUS WNL Milky, creamy d/c at introitus   Musculoskeletal: Normal range of motion. She exhibits no edema and no tenderness.  Neurological: She is alert and oriented to person, place, and time.  Skin: Skin is warm and dry.  Psychiatric: She has a normal mood and affect. Her behavior is normal.   Results for orders placed during the hospital encounter of 09/25/11 (from the past 24 hour(s))  CBC WITH DIFFERENTIAL     Status: Abnormal   Collection Time   09/25/11  8:05 PM      Component Value Range   WBC 16.4 (*) 4.0 - 10.5 K/uL   RBC 4.21  3.87 - 5.11 MIL/uL   Hemoglobin 13.1  12.0 - 15.0 g/dL   HCT 16.1  09.6 - 04.5 %   MCV 91.9  78.0 - 100.0 fL   MCH 31.1  26.0 - 34.0 pg   MCHC 33.9  30.0 - 36.0 g/dL   RDW 40.9  81.1 - 91.4 %   Platelets 256  150 - 400 K/uL   Neutrophils Relative 85 (*) 43 - 77 %   Neutro Abs 13.8 (*) 1.7 - 7.7 K/uL   Lymphocytes Relative 7 (*) 12 - 46 %   Lymphs Abs 1.2  0.7 - 4.0 K/uL   Monocytes Relative 8  3 - 12 %   Monocytes Absolute 1.3 (*) 0.1 - 1.0 K/uL   Eosinophils Relative 0  0 - 5 %   Eosinophils Absolute 0.1  0.0 - 0.7 K/uL   Basophils Relative 0  0 - 1 %   Basophils Absolute 0.0  0.0 - 0.1 K/uL  COMPREHENSIVE METABOLIC PANEL     Status: Abnormal   Collection Time   09/25/11  8:05 PM      Component Value  Range   Sodium 135  135 - 145 mEq/L   Potassium 4.0  3.5 - 5.1 mEq/L   Chloride 99  96 - 112 mEq/L   CO2 25  19 - 32 mEq/L   Glucose, Bld 108 (*) 70 - 99 mg/dL   BUN 11  6 - 23 mg/dL   Creatinine, Ser 1.61  0.50 - 1.10 mg/dL   Calcium 9.1  8.4 - 09.6 mg/dL   Total Protein 7.3  6.0 - 8.3 g/dL   Albumin 3.8  3.5 - 5.2 g/dL   AST 12  0 - 37 U/L   ALT 7  0 - 35 U/L   Alkaline Phosphatase 54  39 - 117 U/L   Total Bilirubin 0.5  0.3 - 1.2 mg/dL   GFR calc non Af Amer 67 (*) >90 mL/min   GFR calc Af Amer 78 (*) >90 mL/min    MAU Course  Procedures     Assessment and Plan  Per consult w Dr Estanislado Pandy will do abd/pelvic CT w/wo contrast IVF's CBC, CMP Elevated white count Fever unk origin    Renada Cronin M 09/25/2011, 8:57 PM   Addendum: pt now drinking contrast for CT C/o headache - will give PO tylenol  Await CT results   Addendum: 09-26-11 at 0102  CT results indicate possible R pyelonephritis, ureters clear, no sign of abscess  D/W Dillard Will admit to 3rd floor  gentamynin per pharm dosing Clindamycin 900mg  IV q8h Will send UA for cx Repeat cbc w diff in the am Motrin, percocet PRN

## 2011-09-25 NOTE — MAU Note (Signed)
Pt states, " I had a TAH September 08, 2011 and I started having pain in my lower abdomen three days ago. I had a temp 102.6 at at 1pm, I took Ibuprofen then and at  6:45pm. The fever started yesterday."

## 2011-09-26 ENCOUNTER — Encounter (HOSPITAL_COMMUNITY): Payer: Self-pay | Admitting: *Deleted

## 2011-09-26 LAB — DIFFERENTIAL
Eosinophils Absolute: 0.1 10*3/uL (ref 0.0–0.7)
Lymphs Abs: 1.4 10*3/uL (ref 0.7–4.0)
Monocytes Absolute: 1.4 10*3/uL — ABNORMAL HIGH (ref 0.1–1.0)
Monocytes Relative: 9 % (ref 3–12)
Neutro Abs: 12 10*3/uL — ABNORMAL HIGH (ref 1.7–7.7)
Neutrophils Relative %: 81 % — ABNORMAL HIGH (ref 43–77)

## 2011-09-26 LAB — CBC
HCT: 34.8 % — ABNORMAL LOW (ref 36.0–46.0)
Hemoglobin: 11.9 g/dL — ABNORMAL LOW (ref 12.0–15.0)
MCH: 31.4 pg (ref 26.0–34.0)
RBC: 3.79 MIL/uL — ABNORMAL LOW (ref 3.87–5.11)

## 2011-09-26 MED ORDER — SIMETHICONE 80 MG PO CHEW: 80.0000 mg | CHEWABLE_TABLET | Freq: Four times a day (QID) | ORAL | Status: DC | PRN

## 2011-09-26 MED ORDER — CLINDAMYCIN PHOSPHATE 900 MG/50ML IV SOLN
900.0000 mg | Freq: Once | INTRAVENOUS | Status: AC
  Administered 2011-09-26: 900 mg via INTRAVENOUS
  Filled 2011-09-26: qty 50

## 2011-09-26 MED ORDER — GENTAMICIN SULFATE 40 MG/ML IJ SOLN
420.0000 mg | Freq: Once | INTRAVENOUS | Status: AC
  Administered 2011-09-26: 420 mg via INTRAVENOUS
  Filled 2011-09-26: qty 10.5

## 2011-09-26 MED ORDER — CLINDAMYCIN PHOSPHATE 900 MG/50ML IV SOLN
900.0000 mg | Freq: Three times a day (TID) | INTRAVENOUS | Status: DC
  Administered 2011-09-26 – 2011-09-27 (×4): 900 mg via INTRAVENOUS
  Filled 2011-09-26 (×4): qty 50

## 2011-09-26 MED ORDER — IBUPROFEN 800 MG PO TABS
800.0000 mg | ORAL_TABLET | Freq: Four times a day (QID) | ORAL | Status: DC | PRN
  Administered 2011-09-26 – 2011-09-27 (×3): 800 mg via ORAL
  Filled 2011-09-26 (×3): qty 1

## 2011-09-26 MED ORDER — ACETAMINOPHEN 500 MG PO TABS
1000.0000 mg | ORAL_TABLET | Freq: Four times a day (QID) | ORAL | Status: DC | PRN
  Administered 2011-09-26: 1000 mg via ORAL
  Filled 2011-09-26: qty 2

## 2011-09-26 MED ORDER — ALBUTEROL SULFATE HFA 108 (90 BASE) MCG/ACT IN AERS
1.0000 | INHALATION_SPRAY | Freq: Four times a day (QID) | RESPIRATORY_TRACT | Status: DC | PRN
  Filled 2011-09-26: qty 6.7

## 2011-09-26 MED ORDER — DEXTROSE IN LACTATED RINGERS 5 % IV SOLN
INTRAVENOUS | Status: DC
  Administered 2011-09-26 – 2011-09-28 (×6): via INTRAVENOUS

## 2011-09-26 MED ORDER — GENTAMICIN SULFATE 40 MG/ML IJ SOLN
420.0000 mg | INTRAVENOUS | Status: DC
  Administered 2011-09-27: 420 mg via INTRAVENOUS
  Filled 2011-09-26: qty 10.5

## 2011-09-26 MED ORDER — OXYCODONE-ACETAMINOPHEN 5-325 MG PO TABS
1.0000 | ORAL_TABLET | ORAL | Status: DC | PRN
  Administered 2011-09-26 – 2011-09-28 (×4): 1 via ORAL
  Filled 2011-09-26 (×4): qty 1

## 2011-09-26 MED ORDER — LEVOTHYROXINE SODIUM 88 MCG PO TABS
88.0000 ug | ORAL_TABLET | Freq: Every day | ORAL | Status: DC
  Administered 2011-09-26 – 2011-09-28 (×3): 88 ug via ORAL
  Filled 2011-09-26 (×4): qty 1

## 2011-09-26 MED ORDER — DOCUSATE SODIUM 100 MG PO CAPS
100.0000 mg | ORAL_CAPSULE | Freq: Two times a day (BID) | ORAL | Status: DC
  Administered 2011-09-26 – 2011-09-28 (×4): 100 mg via ORAL
  Filled 2011-09-26 (×5): qty 1

## 2011-09-26 NOTE — Progress Notes (Signed)
ANTIBIOTIC CONSULT NOTE - FOLLOW UP  Pharmacy Consult for gentamicin Indication: r/o pyelonephritis  Allergies  Allergen Reactions  . Cephalexin     REACTION: rash    Patient Measurements: Height: 5\' 6"  (167.6 cm) Weight: 130 lb 4 oz (59.081 kg) IBW/kg (Calculated) : 59.3    Vital Signs: Temp: 99.1 F (37.3 C) (06/29 1400) Temp src: Oral (06/29 1400) BP: 107/67 mmHg (06/29 1400) Pulse Rate: 79  (06/29 1400) Intake/Output from previous day: 06/28 0701 - 06/29 0700 In: 775 [P.O.:100; I.V.:515; IV Piggyback:160] Out: 900 [Urine:900] Intake/Output from this shift: Total I/O In: 936.3 [P.O.:180; I.V.:706.3; IV Piggyback:50] Out: 575 [Urine:575]  Labs:  [facility] 09/26/11 0520 09/25/11 2005 09/24/11 2123  WBC 14.9* 16.4* 11.8*  HGB 11.9* 13.1 12.2  PLT 255 256 287  LABCREA -- -- --  CREATININE -- 1.00 --   Estimated Creatinine Clearance: 67 ml/min (by C-G formula based on Cr of 1).  Basename 09/26/11 1224  VANCOTROUGH --  VANCOPEAK --  Drue Dun --  GENTTROUGH --  GENTPEAK --  GENTRANDOM 1.8  TOBRATROUGH --  TOBRAPEAK --  TOBRARND --  AMIKACINPEAK --  AMIKACINTROU --  AMIKACIN --     Microbiology: Recent Results (from the past 720 hour(s))  SURGICAL PCR SCREEN     Status: Normal   Collection Time   09/03/11  2:42 PM      Component Value Range Status Comment   MRSA, PCR NEGATIVE  NEGATIVE Final    Staphylococcus aureus NEGATIVE  NEGATIVE Final   URINE CULTURE     Status: Normal   Collection Time   09/16/11 10:42 AM      Component Value Range Status Comment   Colony Count NO GROWTH   Final    Organism ID, Bacteria NO GROWTH   Final     Anti-infectives     Start     Dose/Rate Route Frequency Ordered Stop   09/27/11 0600   gentamicin (GARAMYCIN) 420 mg in dextrose 5 % 100 mL IVPB        420 mg 110.5 mL/hr over 60 Minutes Intravenous Every 24 hours 09/26/11 0110     09/26/11 1000   clindamycin (CLEOCIN) IVPB 900 mg        900 mg 100 mL/hr over 30  Minutes Intravenous Every 8 hours 09/26/11 0036     09/26/11 0115   gentamicin (GARAMYCIN) 420 mg in dextrose 5 % 100 mL IVPB        420 mg 110.5 mL/hr over 60 Minutes Intravenous  Once 09/26/11 0105 09/26/11 0245   09/26/11 0100   clindamycin (CLEOCIN) IVPB 900 mg        900 mg 100 mL/hr over 30 Minutes Intravenous  Once 09/26/11 0045 09/26/11 0149          Assessment: Gentamicin level 1.8 mg/L 10 hours after infusion.   SCr= 1.0   Goal of Therapy:  Gentamicin 10 hour random level <2-3 mg/L.  Plan:  Continue extended interval dosing of gentamicin 7mg /kg Q24 hours.    Joanne Mccarty 09/26/2011,2:44 PM

## 2011-09-26 NOTE — Progress Notes (Signed)
ANTIBIOTIC CONSULT NOTE - INITIAL  Pharmacy Consult for gentamicin Indication: r/o pyelonephritis  Allergies  Allergen Reactions  . Cephalexin     REACTION: rash    Patient Measurements: Height: 5\' 6"  (167.6 cm) Weight: 130 lb 4 oz (59.081 kg) IBW/kg (Calculated) : 59.3  Adjusted Body Weight: ---  Vital Signs: Temp: 99 F (37.2 C) (06/28 2319) Temp src: Oral (06/28 2319) BP: 125/72 mmHg (06/28 2035) Pulse Rate: 104  (06/28 2035) Intake/Output from previous day:   Intake/Output from this shift:    Labs:  [facility] 09/25/11 2005 09/24/11 2123  WBC 16.4* 11.8*  HGB 13.1 12.2  PLT 256 287  LABCREA -- --  CREATININE 1.00 --   Estimated Creatinine Clearance: 67 ml/min (by C-G formula based on Cr of 1).   Microbiology: Recent Results (from the past 720 hour(s))  SURGICAL PCR SCREEN     Status: Normal   Collection Time   09/03/11  2:42 PM      Component Value Range Status Comment   MRSA, PCR NEGATIVE  NEGATIVE Final    Staphylococcus aureus NEGATIVE  NEGATIVE Final   URINE CULTURE     Status: Normal   Collection Time   09/16/11 10:42 AM      Component Value Range Status Comment   Colony Count NO GROWTH   Final    Organism ID, Bacteria NO GROWTH   Final     Medical History: Past Medical History  Diagnosis Date  . History of chickenpox   . Abdominal bruit     fem neg doppler exam  . Hypothyroidism   . Anemia   . History of hypertension 1998    resolved  . Asthma     exercise induced-weather  . PONV (postoperative nausea and vomiting)     Medications:     Clindamycin 900mg  IV q8h    Gentamicin tx - extended interval   Assessment:    Coverage for pyelonephritis.  Plan q24h dosing.  Goal of Therapy:      Desire gentamicin 10hr random level to be <2-56mcg/ml to continue extended dosing interval  Plan: 1.  Dose 7mg /kg q24h = approx 420mg  q24h 2.  Check 10h random level after 1st dose    Zael Shuman, Lloyd Huger E 09/26/2011,12:50 AM

## 2011-09-26 NOTE — Progress Notes (Signed)
Patient ID: Joanne Mccarty, female   DOB: 02/18/1966, 46 y.o.   MRN: [id number]     Subjective: Patient reports tolerating PO and + flatus.  She feels better than yesterday but still feels a little bad.  Back pain is also improving  Objective: BP 107/67  Pulse 79  Temp 99.1 F (37.3 C) (Oral)  Resp 18  Ht 5\' 6"  (1.676 m)  Wt 130 lb 4 oz (59.081 kg)  BMI 21.02 kg/m2  SpO2 100%  LMP 08/13/2011  General: alert, cooperative and fatigued Resp: clear to auscultation bilaterally Cardio: regular rate and rhythm GI: soft, non-tender; bowel sounds normal; no masses,  no organomegaly Extremities: extremities normal, atraumatic, no cyanosis or edema Vaginal Bleeding: minimal back NO CVAT B  Assessment: s/p : TVH 17 days ago with exam c/w Pyelonephritis  Plan: Continue IV abx.  If pt afebrile until tomorrow will d/c home with PO abx.  I discussed the CT with Dr Bradly Chris who felt this is definitely pyelonephritis.  He saw no evidence of ureteral injury.  Ucx is Pending.  I discussed the plan of care with the pt, her husband, Dr Estanislado Pandy.  They all understand and agree with the plan of care.    LOS: 1 day    Joanne Mccarty A 09/26/2011, 2:16 PM

## 2011-09-27 LAB — CBC WITH DIFFERENTIAL/PLATELET
Eosinophils Relative: 1 % (ref 0–5)
HCT: 32 % — ABNORMAL LOW (ref 36.0–46.0)
Lymphocytes Relative: 13 % (ref 12–46)
Lymphs Abs: 1.2 10*3/uL (ref 0.7–4.0)
MCV: 91.4 fL (ref 78.0–100.0)
Monocytes Absolute: 1.1 10*3/uL — ABNORMAL HIGH (ref 0.1–1.0)
Monocytes Relative: 12 % (ref 3–12)
RBC: 3.5 MIL/uL — ABNORMAL LOW (ref 3.87–5.11)
WBC: 9.4 10*3/uL (ref 4.0–10.5)

## 2011-09-27 LAB — URINE CULTURE

## 2011-09-27 MED ORDER — ZOLPIDEM TARTRATE 5 MG PO TABS
5.0000 mg | ORAL_TABLET | Freq: Every evening | ORAL | Status: DC | PRN
  Administered 2011-09-27: 5 mg via ORAL
  Filled 2011-09-27 (×2): qty 1

## 2011-09-27 MED ORDER — PIPERACILLIN-TAZOBACTAM 3.375 G IVPB
3.3750 g | Freq: Four times a day (QID) | INTRAVENOUS | Status: DC
  Administered 2011-09-27 – 2011-09-28 (×4): 3.375 g via INTRAVENOUS
  Filled 2011-09-27 (×7): qty 50

## 2011-09-27 NOTE — Progress Notes (Signed)
Patient ID: Joanne Mccarty, female   DOB: Jul 27, 1965, 46 y.o.   MRN: [id number]     Subjective: Patient reports tolerating PO and no problems voiding.  Still has fever and chills  Objective: BP 106/70  Pulse 84  Temp 98.4 F (36.9 C) (Oral)  Resp 18  Ht 5\' 6"  (1.676 m)  Wt 130 lb 4 oz (59.081 kg)  BMI 21.02 kg/m2  SpO2 97%  LMP 08/13/2011 tmax 102.1 General: alert Resp: clear to auscultation bilaterally Cardio: regular rate and rhythm GI: soft, non-tender; bowel sounds normal; no masses,  no organomegaly Extremities: extremities normal, atraumatic, no cyanosis or edema Vaginal Bleeding: minimal Ucx contaminated bld cx NGTD Assessment: s/p : stable, fever and getting better.  I spoke with Dr Ilsa Iha from ID.  she recommends to d/c clindamycin and gentamycin and start Zosyn.  The pt states she is not allergic to PCN.  WBC going down.  Hope to D/C in the morning.  ID will see her if she does not improve.  pt understands the plan and agrees to proceed.  will recollect ucx  Plan: See above.    LOS: 2 days    Joanne Mccarty A 09/27/2011, 11:56 AM

## 2011-09-28 LAB — CBC WITH DIFFERENTIAL/PLATELET
Basophils Absolute: 0 10*3/uL (ref 0.0–0.1)
Basophils Relative: 0 % (ref 0–1)
Eosinophils Relative: 3 % (ref 0–5)
HCT: 31.7 % — ABNORMAL LOW (ref 36.0–46.0)
MCHC: 33.4 g/dL (ref 30.0–36.0)
MCV: 92.4 fL (ref 78.0–100.0)
Monocytes Absolute: 0.7 10*3/uL (ref 0.1–1.0)
Neutro Abs: 3.5 10*3/uL (ref 1.7–7.7)
RDW: 13.8 % (ref 11.5–15.5)

## 2011-09-28 LAB — URINE CULTURE

## 2011-09-28 MED ORDER — AMOXICILLIN-POT CLAVULANATE 875-125 MG PO TABS: 1.0000 | ORAL_TABLET | Freq: Two times a day (BID) | ORAL | Status: AC

## 2011-09-28 NOTE — Progress Notes (Signed)
Ur chart review completed.  

## 2011-09-28 NOTE — Discharge Summary (Signed)
  Physician Discharge Summary  Patient ID: Joanne Mccarty MRN: [id number] DOB/AGE: 1966/01/21 46 y.o.  Admit date: 09/25/2011 Discharge date: 09/28/2011  Admission Diagnoses: PO PELVIC PAIN FEVER  Discharge Diagnoses: Acute Right pyelonephritis          Discharged Condition: good  Hospital Course: IV antibiotic and hydration  Consults: ID Dr Ilsa Iha  Treatments: IV hydration and antibiotics: Zosyn, gentamycin and Clindamycin                       Patient was readmitted 09/26/11 at POD #18 of TVH/ posterior repair which was complicated by urinary retention  Disposition: 01-Home or Self Care  Discharge Orders    Future Appointments: Provider: Department: Dept Phone: Center:   09/30/2011 10:30 AM Esmeralda Arthur, MD [facility] [phone] None     Medication List  As of 09/28/2011 11:09 AM   STOP taking these medications         ciprofloxacin 500 MG 24 hr tablet      oxyCODONE-acetaminophen 5-325 MG per tablet         TAKE these medications         acetaminophen 500 MG tablet   Commonly known as: TYLENOL   Take 1,000 mg by mouth every 6 (six) hours as needed. For pain, fever      albuterol 108 (90 BASE) MCG/ACT inhaler   Commonly known as: PROVENTIL HFA;VENTOLIN HFA   Inhale 1-2 puffs into the lungs every 6 (six) hours as needed. For exercise induced asthma symptoms.      amoxicillin-clavulanate 875-125 MG per tablet   Commonly known as: AUGMENTIN   Take 1 tablet by mouth 2 (two) times daily.      ibuprofen 400 MG tablet   Commonly known as: ADVIL,MOTRIN   Take 400 mg by mouth every 6 (six) hours as needed. For pain      nystatin-triamcinolone ointment   Commonly known as: MYCOLOG   Apply topically 3 (three) times daily as needed.      SYNTHROID 88 MCG tablet   Generic drug: levothyroxine   Take 1 tablet (88 mcg total) by mouth daily.             Signed: Esmeralda Arthur, MD 09/28/2011, 11:09 AM

## 2011-09-28 NOTE — Progress Notes (Signed)
Patient ID: Joanne Mccarty, female   DOB: 1965/10/01, 46 y.o.   MRN: [id number]     Subjective: Patient reports tolerating PO and no problems voiding. Denies pain. Still C/O poor appetite.Voiding is now normal.  Objective: BP 103/61  Pulse 59  Temp 98 F (36.7 C) (Oral)  Resp 18  Ht 5\' 6"  (1.676 m)  Wt 130 lb 4 oz (59.081 kg)  BMI 21.02 kg/m2  SpO2 99%  LMP 08/13/2011  General: alert Resp: clear to auscultation bilaterally Cardio: regular rate and rhythm GI: soft, non-tender; bowel sounds normal; no masses,  no organomegaly Extremities: extremities normal, atraumatic, no cyanosis or edema Vaginal Bleeding: minimal Ucx pending from 09/27/11 bld cx NGTD Assessment: Acute pyelonephritis day #2 IV ATB now afebrile for 36 hours POD# 20 TVH Spoke with Dr Ilsa Iha and recommends 10 days of Augmentin  Plan: See above.   Follow-up in office 09/30/11  LOS: 3 days    Winson Eichorn A 09/28/2011, 11:07 AM

## 2011-09-28 NOTE — Progress Notes (Signed)
Pt is discharged in the care of Mother. Downstairs per ambulatory. Stable. Denies any pain or discomfort, heavy vaginal bleeding. Discharge instruction given with good understanding. Questions asked and answered.

## 2011-09-30 ENCOUNTER — Ambulatory Visit (INDEPENDENT_AMBULATORY_CARE_PROVIDER_SITE_OTHER): Payer: PRIVATE HEALTH INSURANCE | Admitting: Obstetrics and Gynecology

## 2011-09-30 ENCOUNTER — Encounter: Payer: Self-pay | Admitting: Obstetrics and Gynecology

## 2011-09-30 VITALS — BP 102/68 | Temp 98.3°F | Wt 129.0 lb

## 2011-09-30 DIAGNOSIS — N814 Uterovaginal prolapse, unspecified: Secondary | ICD-10-CM

## 2011-09-30 DIAGNOSIS — N816 Rectocele: Secondary | ICD-10-CM

## 2011-09-30 MED ORDER — FLUCONAZOLE 100 MG PO TABS: 150.0000 mg | ORAL_TABLET | Freq: Every day | ORAL | Status: AC

## 2011-09-30 NOTE — Progress Notes (Signed)
Surgery: HYSTERECTOMY VAGINAL POSTERIOR REPAIR (RECTOCELE)   Date: 09/08/2011                 Post-op complicated by urinary retention and subsequent Right pyelonepritis (re-admission: 09/26/11-09/28/11)  Eating a regular diet without difficulty. Bowel movements are normal.  The patient is not having any pain. Pt with 1 episode of abd pain 8/10 on yesterday. Toradol used with complete relief of pain. Bladder function is returned to normal. Vaginal bleeding: none Vaginal discharge: no vaginal discharge    Subjective:     Joanne Mccarty is a 46 y.o. female who presents for post-op visit.  Pathology report: was reviewed with patient.  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 - ATROPHIC ENDOMETRIUM; NEGATIVE FOR HYPERPLASIA OR MALIGNANCY. - BENIGN CERVICAL MUCOSA; NEGATIVE FOR INTRAEPITHELIAL LESION OR MALIGNANCY. - UNREMARKABLE UTERINE SEROSA.  The following portions of the patient's history were reviewed and updated as appropriate: allergies, current medications, past family history, past medical history, past social history, past surgical history and problem list.  Review of Systems Pertinent items are noted in HPI.   Objective:    BP 102/68  Temp 98.3 F (36.8 C)  Wt 129 lb (58.514 kg)  LMP 08/13/2011 Weight:  Wt Readings from Last 1 Encounters:  09/30/11 129 lb (58.514 kg)    BMI: There is no height on file to calculate BMI.  General Appearance: Alert, appropriate appearance for age. No acute distress Lungs: clear to auscultation bilaterally Back: No CVA tenderness Cardiovascular: Regular rate and rhythm. S1, S2, no murmur Gastrointestinal: Soft, non-tender, no masses or organomegaly Pelvic Exam: vaginal cuff healing well   Bimanual exam normal  Assessment:    Doing well postoperatively. Operative findings again reviewed.   Plan:   Maintain lifting and sex restrictions Follow-up 3 weeks  Joanne Mccarty  A MD 7/3/201311:20 AM

## 2011-10-02 LAB — CULTURE, BLOOD (ROUTINE X 2): Culture: NO GROWTH

## 2011-11-04 ENCOUNTER — Encounter: Payer: Self-pay | Admitting: Obstetrics and Gynecology

## 2011-11-04 ENCOUNTER — Ambulatory Visit (INDEPENDENT_AMBULATORY_CARE_PROVIDER_SITE_OTHER): Payer: PRIVATE HEALTH INSURANCE | Admitting: Obstetrics and Gynecology

## 2011-11-04 VITALS — BP 110/74 | Wt 126.0 lb

## 2011-11-04 DIAGNOSIS — N819 Female genital prolapse, unspecified: Secondary | ICD-10-CM

## 2011-11-04 NOTE — Progress Notes (Signed)
Surgery:09/08/2011  Date: robotic hysterectomy with post-op bladder atony followed by PNA  Eating a regular diet with difficulty. Bowel movements are normal.  Pain is controlled without any medications.  Bladder function is returned to normal. Vaginal bleeding: none Vaginal discharge: no vaginal discharge    Subjective:     Joanne Mccarty is a 46 y.o. female who presents for post-op visit.  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 - ATROPHIC ENDOMETRIUM; NEGATIVE FOR HYPERPLASIA OR MALIGNANCY. - BENIGN CERVICAL MUCOSA; NEGATIVE FOR INTRAEPITHELIAL LESION OR MALIGNANCY. - UNREMARKABLE UTERINE SEROSA. was reviewed with patient.  The following portions of the patient's history were reviewed and updated as appropriate: allergies, current medications, past family history, past medical history, past social history, past surgical history and problem list.  Review of Systems Pertinent items are noted in HPI.   Objective:    BP 110/74  Wt 126 lb (57.153 kg)  LMP 08/13/2011 Weight:  Wt Readings from Last 1 Encounters:  11/04/11 126 lb (57.153 kg)    BMI: There is no height on file to calculate BMI.  General Appearance: Alert, appropriate appearance for age. No acute distress Gastrointestinal: Soft, non-tender, no masses or organomegaly Incision/s: none Pelvic Exam: vaginal cuff healing well   Bimanual exam normal  Assessment:    Doing well postoperatively. Operative findings again reviewed.   Plan:    Follow up: 1 year or PRN Chest CT by PCP in December ( lung nodule found peri-operatively)   [id number] A MD 8/7/20132:04 PM

## 2011-12-23 ENCOUNTER — Other Ambulatory Visit: Payer: Self-pay

## 2011-12-23 ENCOUNTER — Other Ambulatory Visit: Payer: PRIVATE HEALTH INSURANCE

## 2011-12-23 DIAGNOSIS — L659 Nonscarring hair loss, unspecified: Secondary | ICD-10-CM

## 2011-12-28 ENCOUNTER — Telehealth: Payer: Self-pay | Admitting: Obstetrics and Gynecology

## 2011-12-28 NOTE — Telephone Encounter (Signed)
Spoke with pt rgd msg. Pt stated she was call for her lab results. Advised pt that Dr. Estanislado Pandy has not gone over the labs yet and she will get a phone call once she goes over it. Pt's voice understanding. bt cma

## 2012-01-11 ENCOUNTER — Telehealth: Payer: Self-pay | Admitting: Family Medicine

## 2012-01-11 ENCOUNTER — Ambulatory Visit (INDEPENDENT_AMBULATORY_CARE_PROVIDER_SITE_OTHER): Payer: PRIVATE HEALTH INSURANCE | Admitting: Internal Medicine

## 2012-01-11 ENCOUNTER — Encounter: Payer: Self-pay | Admitting: Internal Medicine

## 2012-01-11 VITALS — BP 102/70 | HR 60 | Temp 98.4°F | Wt 128.0 lb

## 2012-01-11 DIAGNOSIS — Z90710 Acquired absence of both cervix and uterus: Secondary | ICD-10-CM | POA: Insufficient documentation

## 2012-01-11 DIAGNOSIS — Z87440 Personal history of urinary (tract) infections: Secondary | ICD-10-CM

## 2012-01-11 DIAGNOSIS — Z23 Encounter for immunization: Secondary | ICD-10-CM

## 2012-01-11 DIAGNOSIS — R911 Solitary pulmonary nodule: Secondary | ICD-10-CM

## 2012-01-11 DIAGNOSIS — Z87448 Personal history of other diseases of urinary system: Secondary | ICD-10-CM

## 2012-01-11 DIAGNOSIS — E039 Hypothyroidism, unspecified: Secondary | ICD-10-CM

## 2012-01-11 DIAGNOSIS — Z87891 Personal history of nicotine dependence: Secondary | ICD-10-CM

## 2012-01-11 DIAGNOSIS — G4700 Insomnia, unspecified: Secondary | ICD-10-CM

## 2012-01-11 MED ORDER — ZOLPIDEM TARTRATE 5 MG PO TABS: 5.0000 mg | ORAL_TABLET | Freq: Every evening | ORAL | Status: DC | PRN

## 2012-01-11 MED ORDER — SYNTHROID 88 MCG PO TABS: 88.0000 ug | ORAL_TABLET | Freq: Every day | ORAL | Status: DC

## 2012-01-11 NOTE — Telephone Encounter (Signed)
Usually without contrast for a small nodule  Unless stated  but I think we should ask radiologist this question to get it right.  We can contact radiology about this to get an answer.  Thanks .

## 2012-01-11 NOTE — Telephone Encounter (Signed)
The patient called back to make sure that you want the CT scan w/o contrast.  Prior was done with contrast.  Husband (physician) says that it should be done with.  Please advise.  Thanks!!

## 2012-01-11 NOTE — Progress Notes (Signed)
  Subjective:    Patient ID: Joanne Mccarty, female    DOB: 28-Aug-1965, 46 y.o.   MRN: [id number]  HPI Patient comes in today for follow up of  multiple medical problems.   Sleep: Hard to stay asleep   2 am   wakending   Bed 11 30  Or so .  And doesn't stay asleep.  Children get up in middle of night and hard to go back to sleep  Has taken 1/4 of ambien in remote past with help at times   helpgul if has this   THyroid : done at [facility] has results:   On brand without change. Taking had some hair issues   Had Hysterectomy in spring and then  sterile pyuria pyelo  Eventually better with antibiotic inpatient but discovered an incidental pulm  Nodule  5mm right that needs fu. She is an ex smoker   Back to running but not up to baseline before surgery.   Had stress fracture right foot  In May pre surgery is better   Eating well no vits  Needs flu vaccine     Review of Systems No cp sob fever new sx except as above no bleeding  Rest as per hpi Past history family history social history reviewed in the electronic medical record.     Objective:   Physical Exam BP 102/70  Pulse 60  Temp 98.4 F (36.9 C) (Oral)  Wt 128 lb (58.06 kg)  LMP 08/13/2011 wdwn in nad Neck: Supple without adenopathy or masses or bruits Chest:  Clear to Awithout wheezes rales or rhonchi CV:  S1-S2 no gallops or murmurs peripheral perfusion is normal Abdomen:  Sof,t normal bowel sounds without hepatosplenomegaly, no guarding rebound or masses no CVA tenderness No clubbing cyanosis or edema Lab Results  Component Value Date   TSH 1.473 12/23/2011    Lab Results  Component Value Date   WBC 5.8 09/28/2011   HGB 10.6* 09/28/2011   HCT 31.7* 09/28/2011   PLT 212 09/28/2011   GLUCOSE 108* 09/25/2011   ALT 7 09/25/2011   AST 12 09/25/2011   NA 135 09/25/2011   K 4.0 09/25/2011   CL 99 09/25/2011   CREATININE 1.00 09/25/2011   BUN 11 09/25/2011   CO2 25 09/25/2011   TSH 1.473 12/23/2011      Assessment & Plan:    Hypothyroid Stable readings  Remain on same  Dose  X 1 year Sleep  prob habit wakening with children involved  Hard to go back to sleep   Will work on this   Risk benefit of medication discussed. Callfor refill if needed    .  Ov year unless needed. Low dose as needed  Pulm nodule right 5 mm incidental finding   But hx of tobacco   Plan fu chest ct for December  ( 6 months fu ) fu depending on results.  Flu vaccine today   Hx stress fx  Get vit d level at next blood testing   Or next year  .

## 2012-01-11 NOTE — Patient Instructions (Signed)
Ok to use [place] with caution as we discussed .call for refills.  Will schedule  Ct for fu of pulmonary nodule  No change in synthroid dose.  Yearly OV otherwise or as needed.

## 2012-01-12 NOTE — Telephone Encounter (Signed)
Joanne Mccarty can you have someone to call over radiology to get opinion about whether ct of nodule should be done with  Or without contrast.

## 2012-01-18 ENCOUNTER — Other Ambulatory Visit: Payer: Self-pay | Admitting: Family Medicine

## 2012-01-18 DIAGNOSIS — R911 Solitary pulmonary nodule: Secondary | ICD-10-CM

## 2012-01-18 NOTE — Telephone Encounter (Signed)
Called over to CT and spoke to [facility] who informed me that CT needed to be done with contrast.  Placed a new order in the system.  Cancelled the old.  Notified the pt by telephone.

## 2012-01-28 ENCOUNTER — Encounter: Payer: Self-pay | Admitting: *Deleted

## 2012-02-10 ENCOUNTER — Telehealth: Payer: Self-pay | Admitting: Internal Medicine

## 2012-02-10 ENCOUNTER — Other Ambulatory Visit: Payer: Self-pay | Admitting: Obstetrics and Gynecology

## 2012-02-10 DIAGNOSIS — Z1231 Encounter for screening mammogram for malignant neoplasm of breast: Secondary | ICD-10-CM

## 2012-02-10 NOTE — Telephone Encounter (Signed)
Pt called re: status of CT referral. Pt didn't know when the next one is suppose to be scheduled or who pt will be contacting her?

## 2012-02-11 NOTE — Telephone Encounter (Signed)
Pt is not due until december

## 2012-02-11 NOTE — Telephone Encounter (Signed)
Left message on personally identified cell.

## 2012-02-17 ENCOUNTER — Encounter: Payer: Self-pay | Admitting: Internal Medicine

## 2012-02-17 ENCOUNTER — Ambulatory Visit (INDEPENDENT_AMBULATORY_CARE_PROVIDER_SITE_OTHER): Payer: PRIVATE HEALTH INSURANCE | Admitting: Internal Medicine

## 2012-02-17 ENCOUNTER — Other Ambulatory Visit (INDEPENDENT_AMBULATORY_CARE_PROVIDER_SITE_OTHER): Payer: PRIVATE HEALTH INSURANCE

## 2012-02-17 ENCOUNTER — Other Ambulatory Visit: Payer: Self-pay | Admitting: *Deleted

## 2012-02-17 VITALS — BP 90/68 | HR 76 | Ht 65.75 in | Wt 127.1 lb

## 2012-02-17 DIAGNOSIS — K625 Hemorrhage of anus and rectum: Secondary | ICD-10-CM

## 2012-02-17 LAB — CBC WITH DIFFERENTIAL/PLATELET
Basophils Relative: 0.5 % (ref 0.0–3.0)
Eosinophils Absolute: 0.3 10*3/uL (ref 0.0–0.7)
HCT: 32.3 % — ABNORMAL LOW (ref 36.0–46.0)
Hemoglobin: 10.8 g/dL — ABNORMAL LOW (ref 12.0–15.0)
MCHC: 33.3 g/dL (ref 30.0–36.0)
MCV: 91.4 fl (ref 78.0–100.0)
Monocytes Absolute: 0.5 10*3/uL (ref 0.1–1.0)
Neutro Abs: 3.2 10*3/uL (ref 1.4–7.7)
RBC: 3.53 Mil/uL — ABNORMAL LOW (ref 3.87–5.11)

## 2012-02-17 MED ORDER — HYOSCYAMINE SULFATE 0.125 MG SL SUBL: SUBLINGUAL_TABLET | SUBLINGUAL | Status: DC

## 2012-02-17 MED ORDER — MOVIPREP 100 G PO SOLR: 1.0000 | Freq: Once | ORAL | Status: DC

## 2012-02-17 NOTE — Progress Notes (Signed)
Joanne Mccarty 09/26/1965 MRN [id number]  History of Present Illness:  This is a 46-year-old white female runner with recurrent low-volume hematochezia, associated with a feeling of rectal fullness and crampy abdominal pain after she finishes running, resulting in diarrhea, pain and weakness. The symptoms bother her to the degree were she has to rest and cannot fully function for several hours. We have seen her for similar problems in the past. She used to be constipated but currently is having regular bowel movements. She has been on thyroid replacement and has been euthyroid. She has lost about 3 pounds from her usual 130 to 127 pounds running about 20 miles a week. The cramps used to occur after running 10 mile distances but now are happening after 3 miles. There is no family history of colon cancer. She used to have iron deficiency anemia but it has been corrected and she apparently had a normal blood count during her recent surgery for a posterior repair by Dr. Rivard. Patient feels that the  transvagial repair of the ectocele did not improve the rectal bleeding of the rectal symptoms.  Past Medical History  Diagnosis Date  . History of chickenpox   . Abdominal bruit     fem neg doppler exam  . Hypothyroidism   . Anemia   . History of hypertension 1998    resolved  . Asthma     exercise induced-weather  . PONV (postoperative nausea and vomiting)   . Hx of pyelonephritis 2013    after surgery culture negative   . Rectal pressure 06/04/2011    while running   . Metatarsal stress fracture 07/22/2011    Based on scan and exam 80% likelihood of stress fracture but appears early   . Galactorrhea 06/21/2010   Past Surgical History  Procedure Date  . Dysplastic lesion      x 3 4/08  . Endometrial ablation april 2009  . Breast surgery     left bx  . Novasure ablation   . Dilation and curettage of uterus 12/2007  . Robotic cystectomy 02/2010  . Vaginal hysterectomy 09/08/2011    Procedure:  HYSTERECTOMY VAGINAL;  Surgeon: Sandra A Rivard, MD;  Location: [facility];  Service: Gynecology;  Laterality: N/A;  . Rectocele repair 09/08/2011    Procedure: POSTERIOR REPAIR (RECTOCELE);  Surgeon: Sandra A Rivard, MD;  Location: [facility];  Service: Gynecology;;    reports that she quit smoking about 24 years ago. Her smoking use included Cigarettes. She quit after 5 years of use. She has never used smokeless tobacco. She reports that she drinks about 8.4 ounces of alcohol per week. She reports that she does not use illicit drugs. family history includes Crohn's disease in her brother; Diabetes in her maternal grandfather and maternal uncle; Hyperlipidemia in her father; Hypertension in her mother; Hypothyroidism in her son; and Melanoma in her mother.  There is no history of Colon cancer. Allergies  Allergen Reactions  . Cephalexin     REACTION: rash        Review of Systems: As mentioned about cramps rectal bleeding rectal fullness. No upper GI symptoms  The remainder of the 10 point ROS is negative except as outlined in H&P   Physical Exam: General appearance  Well developed, in no distress. Eyes- non icteric. HEENT nontraumatic, normocephalic. Mouth no lesions, tongue papillated, no cheilosis. Neck supple without adenopathy, thyroid not enlarged, no carotid bruits, no JVD. Lungs Clear to auscultation bilaterally. Cor normal S1, normal S2, regular rhythm,   no murmur,  quiet precordium. Abdomen: Scaphoid soft nontender with normoactive bowel sounds., Liver at costal margin. Rectal: And anoscopic exam reveals normal perianal area. Normal rectal sphincter tone. 2 soft folds of tissue which may be hemorrhoids or prolapsed rectal mucosa descending into the anal canal. They are red and hyperemic but there is no bleeding. There is no stool in the rectal ampulla, there is no anal fissure. Extremities no pedal edema. Skin no lesions. Neurological alert and oriented x 3. Psychological normal mood  and affect.  Assessment and Plan:  Problem #1 Crampy abdominal pain following running and exercise suggestive of a low flow state occuring due to  increased exertion. It could be physiological but she is having quite severe symptoms suggestive of ischemic colitis. I doubt that we are dealing with inflammatory bowel disease or a colon lesion, but at her age of 46, I would recommend a colonoscopy. The rectal fullness and bleeding is related either to internal hemorrhoids or to prolapse of rectal tissue which was not corrected with the posterior repair. We will schedule her for a colonoscopy and at the same time I will examine the rectal ampulla and see whether she would benefit from a band ligation. The ligation would only be effective if the tissue is still from hemorrhoids but would not be indicated if it is part of the rectal prolapse. She will take Levsin sublingually 0.125 mg after she runs to prevent crampy abdominal pain and I encourage her to drink a lot of liquids especially electrolytes rather than just water.   02/17/2012 Jaxten Brosh  

## 2012-02-17 NOTE — Patient Instructions (Addendum)
You have been scheduled for a colonoscopy with propofol. Please follow written instructions given to you at your visit today.  Please pick up your prep kit at the pharmacy within the next 1-3 days. If you use inhalers (even only as needed) or a CPAP machine, please bring them with you on the day of your procedure. We have sent the following medications to your pharmacy for you to pick up at your convenience: Levsin SL Your physician has requested that you go to the basement for the following lab work before leaving today: CBC, IBC, Sed Rate, Celiac 10 Panel CC: Dr Berniece Andreas, Dr Dois Davenport Rivard

## 2012-02-18 LAB — CELIAC PANEL 10
Endomysial Screen: NEGATIVE
Gliadin IgA: 5.7 U/mL (ref <20)
Tissue Transglut Ab: 8.7 U/mL (ref <20)
Tissue Transglutaminase Ab, IgA: 3.2 U/mL (ref <20)

## 2012-03-09 NOTE — H&P (View-Only) (Signed)
Joanne Mccarty 04/23/1965 MRN [id number]  History of Present Illness:  This is a 46 year old white female runner with recurrent low-volume hematochezia, associated with a feeling of rectal fullness and crampy abdominal pain after she finishes running, resulting in diarrhea, pain and weakness. The symptoms bother her to the degree were she has to rest and cannot fully function for several hours. We have seen her for similar problems in the past. She used to be constipated but currently is having regular bowel movements. She has been on thyroid replacement and has been euthyroid. She has lost about 3 pounds from her usual 130 to 127 pounds running about 20 miles a week. The cramps used to occur after running 10 mile distances but now are happening after 3 miles. There is no family history of colon cancer. She used to have iron deficiency anemia but it has been corrected and she apparently had a normal blood count during her recent surgery for a posterior repair by Dr. Estanislado Pandy. Patient feels that the  transvagial repair of the ectocele did not improve the rectal bleeding of the rectal symptoms.  Past Medical History  Diagnosis Date  . History of chickenpox   . Abdominal bruit     fem neg doppler exam  . Hypothyroidism   . Anemia   . History of hypertension 1998    resolved  . Asthma     exercise induced-weather  . PONV (postoperative nausea and vomiting)   . Hx of pyelonephritis 2013    after surgery culture negative   . Rectal pressure 06/04/2011    while running   . Metatarsal stress fracture 07/22/2011    Based on scan and exam 80% likelihood of stress fracture but appears early   . Galactorrhea 06/21/2010   Past Surgical History  Procedure Date  . Dysplastic lesion      x 3 4/08  . Endometrial ablation april 2009  . Breast surgery     left bx  . Novasure ablation   . Dilation and curettage of uterus 12/2007  . Robotic cystectomy 02/2010  . Vaginal hysterectomy 09/08/2011    Procedure:  HYSTERECTOMY VAGINAL;  Surgeon: Esmeralda Arthur, MD;  Location: [facility];  Service: Gynecology;  Laterality: N/A;  . Rectocele repair 09/08/2011    Procedure: POSTERIOR REPAIR (RECTOCELE);  Surgeon: Esmeralda Arthur, MD;  Location: [facility];  Service: Gynecology;;    reports that she quit smoking about 24 years ago. Her smoking use included Cigarettes. She quit after 5 years of use. She has never used smokeless tobacco. She reports that she drinks about 8.4 ounces of alcohol per week. She reports that she does not use illicit drugs. family history includes Crohn's disease in her brother; Diabetes in her maternal grandfather and maternal uncle; Hyperlipidemia in her father; Hypertension in her mother; Hypothyroidism in her son; and Melanoma in her mother.  There is no history of Colon cancer. Allergies  Allergen Reactions  . Cephalexin     REACTION: rash        Review of Systems: As mentioned about cramps rectal bleeding rectal fullness. No upper GI symptoms  The remainder of the 10 point ROS is negative except as outlined in H&P   Physical Exam: General appearance  Well developed, in no distress. Eyes- non icteric. HEENT nontraumatic, normocephalic. Mouth no lesions, tongue papillated, no cheilosis. Neck supple without adenopathy, thyroid not enlarged, no carotid bruits, no JVD. Lungs Clear to auscultation bilaterally. Cor normal S1, normal S2, regular rhythm,  no murmur,  quiet precordium. Abdomen: Scaphoid soft nontender with normoactive bowel sounds., Liver at costal margin. Rectal: And anoscopic exam reveals normal perianal area. Normal rectal sphincter tone. 2 soft folds of tissue which may be hemorrhoids or prolapsed rectal mucosa descending into the anal canal. They are red and hyperemic but there is no bleeding. There is no stool in the rectal ampulla, there is no anal fissure. Extremities no pedal edema. Skin no lesions. Neurological alert and oriented x 3. Psychological normal mood  and affect.  Assessment and Plan:  Problem #1 Crampy abdominal pain following running and exercise suggestive of a low flow state occuring due to  increased exertion. It could be physiological but she is having quite severe symptoms suggestive of ischemic colitis. I doubt that we are dealing with inflammatory bowel disease or a colon lesion, but at her age of 76, I would recommend a colonoscopy. The rectal fullness and bleeding is related either to internal hemorrhoids or to prolapse of rectal tissue which was not corrected with the posterior repair. We will schedule her for a colonoscopy and at the same time I will examine the rectal ampulla and see whether she would benefit from a band ligation. The ligation would only be effective if the tissue is still from hemorrhoids but would not be indicated if it is part of the rectal prolapse. She will take Levsin sublingually 0.125 mg after she runs to prevent crampy abdominal pain and I encourage her to drink a lot of liquids especially electrolytes rather than just water.   02/17/2012 Joanne Mccarty

## 2012-03-09 NOTE — Interval H&P Note (Signed)
History and Physical Interval Note:  03/09/2012 11:09 PM  Joanne Mccarty  has presented today for surgery, with the diagnosis of rectal bleeding  The various methods of treatment have been discussed with the patient and family. After consideration of risks, benefits and other options for treatment, the patient has consented to  Procedure(s) (LRB) with comments: COLONOSCOPY (N/A) HEMORRHOID BANDING (N/A) as a surgical intervention .  The patient's history has been reviewed, patient examined, no change in status, stable for surgery.  I have reviewed the patient's chart and labs.  Questions were answered to the patient's satisfaction.     Lina Sar

## 2012-03-10 ENCOUNTER — Ambulatory Visit (HOSPITAL_COMMUNITY)
Admission: RE | Admit: 2012-03-10 | Discharge: 2012-03-10 | Disposition: A | Discharge status: Home / Self Care | Payer: PRIVATE HEALTH INSURANCE | Source: Ambulatory Visit | Attending: Internal Medicine | Admitting: Internal Medicine

## 2012-03-10 ENCOUNTER — Encounter: Payer: Self-pay | Admitting: *Deleted

## 2012-03-10 ENCOUNTER — Encounter (HOSPITAL_COMMUNITY)
Admission: RE | Disposition: A | Discharge status: Home / Self Care | Payer: Self-pay | Source: Ambulatory Visit | Attending: Internal Medicine

## 2012-03-10 DIAGNOSIS — K921 Melena: Secondary | ICD-10-CM | POA: Insufficient documentation

## 2012-03-10 DIAGNOSIS — K648 Other hemorrhoids: Secondary | ICD-10-CM

## 2012-03-10 DIAGNOSIS — E039 Hypothyroidism, unspecified: Secondary | ICD-10-CM | POA: Insufficient documentation

## 2012-03-10 DIAGNOSIS — D509 Iron deficiency anemia, unspecified: Secondary | ICD-10-CM | POA: Insufficient documentation

## 2012-03-10 DIAGNOSIS — R109 Unspecified abdominal pain: Secondary | ICD-10-CM | POA: Insufficient documentation

## 2012-03-10 DIAGNOSIS — K625 Hemorrhage of anus and rectum: Secondary | ICD-10-CM

## 2012-03-10 HISTORY — PX: HEMORRHOID BANDING: SHX5850

## 2012-03-10 HISTORY — PX: COLONOSCOPY: SHX5424

## 2012-03-10 SURGERY — COLONOSCOPY
Anesthesia: Moderate Sedation

## 2012-03-10 MED ORDER — HYDROCODONE-ACETAMINOPHEN 5-325 MG PO TABS
1.0000 | ORAL_TABLET | Freq: Once | ORAL | Status: AC
  Administered 2012-03-10: 1 via ORAL

## 2012-03-10 MED ORDER — FENTANYL CITRATE 0.05 MG/ML IJ SOLN
INTRAMUSCULAR | Status: AC
  Filled 2012-03-10: qty 2

## 2012-03-10 MED ORDER — FENTANYL CITRATE 0.05 MG/ML IJ SOLN
INTRAMUSCULAR | Status: DC | PRN
  Administered 2012-03-10 (×5): 25 ug via INTRAVENOUS

## 2012-03-10 MED ORDER — SODIUM CHLORIDE 0.9 % IV SOLN
INTRAVENOUS | Status: DC
  Administered 2012-03-10: 500 mL via INTRAVENOUS

## 2012-03-10 MED ORDER — MIDAZOLAM HCL 10 MG/2ML IJ SOLN
INTRAMUSCULAR | Status: AC
  Filled 2012-03-10: qty 2

## 2012-03-10 MED ORDER — HYDROCORTISONE ACETATE 25 MG RE SUPP: 25.0000 mg | Freq: Every evening | RECTAL | Status: DC | PRN

## 2012-03-10 MED ORDER — MIDAZOLAM HCL 5 MG/5ML IJ SOLN
INTRAMUSCULAR | Status: DC | PRN
  Administered 2012-03-10 (×5): 2 mg via INTRAVENOUS
  Administered 2012-03-10 (×2): 1 mg via INTRAVENOUS

## 2012-03-10 MED ORDER — HYDROCODONE-ACETAMINOPHEN 5-325 MG PO TABS
ORAL_TABLET | ORAL | Status: AC
  Filled 2012-03-10: qty 1

## 2012-03-10 NOTE — Op Note (Signed)
[facility] [address] [place], [address]   COLONOSCOPY PROCEDURE REPORT  PATIENT: Joanne Mccarty, Joanne Mccarty  MR#: [id number] BIRTHDATE: 20-Jun-1965 , 46  yrs. old GENDER: Female ENDOSCOPIST: Hart Carwin, MD REFERRED BY:  Silverio Lay, M.D. PROCEDURE DATE:  03/10/2012 PROCEDURE:   Colonoscopy, diagnostic , hemorrhoidal band ligation ASA CLASS:   Class I INDICATIONS:hematochezia, Iron Deficiency Anemia, and crampy abdominal pain while running, low volume hematochezia off and on for several years. MEDICATIONS: These medications were titrated to patient response per physician's verbal order, Versed, Versed-Detailed 12.5 mg IV, and Fentanyl-Detailed 125 mcg IV  DESCRIPTION OF PROCEDURE:   After the risks and benefits and of the procedure were explained, informed consent was obtained.  A digital rectal exam revealed no abnormalities of the rectum.    The Pentax Ped Colon [id number]  endoscope was introduced through the anus and advanced to the cecum, which was identified by both the appendix and ileocecal valve .  The quality of the prep was excellent, using MoviPrep .  The instrument was then slowly withdrawn as the colon was fully examined.     COLON FINDINGS: Small internal hemorrhoids were found., 2 internal hemorrhoids close to the dentate line were banded with a 7-band shooter, 3 bands deplyoed, only 2 bands stayed on due to hemorrhoids being very small,, there was bleeding from the second hemorrhoid after the band was applied    Retroflexed views revealed no abnormalities.     The scope was then withdrawn from the patient and the procedure completed.  COMPLICATIONS: There were no complications. ENDOSCOPIC IMPRESSION: Small internal hemorrhoids s/p internal hemorrhoid band ligation x2, no other abnolmality of the colon, hematochezia  due to an anorectal source no evidence of ischemic colitis although the low flow state during running, combined with anemia, may  be causing crampy abdominal pain  RECOMMENDATIONS: correct anemia- continue Iron supplements and recheck H/H trial of Levsin  SL. 125 mg before running, stay hydrated  before and while running would hold of on CT angio at this point rectal care- Sitz baths, Anusol HC supp hs recheck in the office in 4 weeks  REPEAT EXAM: for Colonoscopy.age 70  cc:  _______________________________ eSigned:  Hart Carwin, MD 03/10/2012 11:02 AM

## 2012-03-11 ENCOUNTER — Encounter (HOSPITAL_COMMUNITY): Payer: Self-pay | Admitting: Internal Medicine

## 2012-03-14 ENCOUNTER — Encounter: Payer: Self-pay | Admitting: *Deleted

## 2012-03-21 ENCOUNTER — Telehealth: Payer: Self-pay | Admitting: *Deleted

## 2012-03-21 NOTE — Telephone Encounter (Signed)
Message copied by Daphine Deutscher on Mon Mar 21, 2012 10:55 AM ------      Message from: Daphine Deutscher      Created: Wed Feb 17, 2012  3:58 PM       Call and remind patient due for CBC for DB 03/28/12

## 2012-03-21 NOTE — Telephone Encounter (Signed)
Patient notified that labs are due. She will come next week.

## 2012-03-28 ENCOUNTER — Ambulatory Visit
Admission: RE | Admit: 2012-03-28 | Discharge: 2012-03-28 | Disposition: A | Discharge status: Home / Self Care | Payer: PRIVATE HEALTH INSURANCE | Source: Ambulatory Visit | Attending: Internal Medicine | Admitting: Internal Medicine

## 2012-03-28 ENCOUNTER — Telehealth: Payer: Self-pay | Admitting: Internal Medicine

## 2012-03-28 DIAGNOSIS — R911 Solitary pulmonary nodule: Secondary | ICD-10-CM

## 2012-03-28 MED ORDER — IOHEXOL 300 MG/ML  SOLN
75.0000 mL | Freq: Once | INTRAMUSCULAR | Status: AC | PRN
  Administered 2012-03-28: 75 mL via INTRAVENOUS

## 2012-03-28 NOTE — Telephone Encounter (Signed)
Pt had chest ct scan requesting results

## 2012-03-28 NOTE — Telephone Encounter (Signed)
See results  Noted and released in my chart . Results stable  Advise repeat WITHOUT CONTRAST in 1 year to ensure stability.

## 2012-03-29 ENCOUNTER — Other Ambulatory Visit (INDEPENDENT_AMBULATORY_CARE_PROVIDER_SITE_OTHER): Payer: PRIVATE HEALTH INSURANCE

## 2012-03-29 DIAGNOSIS — K625 Hemorrhage of anus and rectum: Secondary | ICD-10-CM

## 2012-03-29 LAB — CBC WITH DIFFERENTIAL/PLATELET
Basophils Absolute: 0 10*3/uL (ref 0.0–0.1)
Eosinophils Relative: 3.7 % (ref 0.0–5.0)
Lymphocytes Relative: 29.2 % (ref 12.0–46.0)
Monocytes Relative: 8.8 % (ref 3.0–12.0)
Neutrophils Relative %: 57.5 % (ref 43.0–77.0)
Platelets: 297 10*3/uL (ref 150.0–400.0)
WBC: 5.3 10*3/uL (ref 4.5–10.5)

## 2012-03-29 NOTE — Telephone Encounter (Signed)
Patient notified by telephone. 

## 2012-03-31 ENCOUNTER — Other Ambulatory Visit: Payer: Self-pay | Admitting: *Deleted

## 2012-03-31 DIAGNOSIS — D649 Anemia, unspecified: Secondary | ICD-10-CM

## 2012-04-13 ENCOUNTER — Ambulatory Visit (INDEPENDENT_AMBULATORY_CARE_PROVIDER_SITE_OTHER): Payer: PRIVATE HEALTH INSURANCE | Admitting: Internal Medicine

## 2012-04-13 ENCOUNTER — Encounter: Payer: Self-pay | Admitting: Internal Medicine

## 2012-04-13 ENCOUNTER — Other Ambulatory Visit (INDEPENDENT_AMBULATORY_CARE_PROVIDER_SITE_OTHER): Payer: PRIVATE HEALTH INSURANCE

## 2012-04-13 ENCOUNTER — Telehealth: Payer: Self-pay | Admitting: Internal Medicine

## 2012-04-13 VITALS — BP 110/62 | HR 83 | Ht 65.75 in | Wt 127.0 lb

## 2012-04-13 DIAGNOSIS — K648 Other hemorrhoids: Secondary | ICD-10-CM

## 2012-04-13 DIAGNOSIS — D649 Anemia, unspecified: Secondary | ICD-10-CM

## 2012-04-13 LAB — CBC WITH DIFFERENTIAL/PLATELET
Basophils Relative: 0.5 % (ref 0.0–3.0)
Eosinophils Relative: 1.2 % (ref 0.0–5.0)
HCT: 33.8 % — ABNORMAL LOW (ref 36.0–46.0)
Lymphs Abs: 1.4 10*3/uL (ref 0.7–4.0)
MCV: 86.2 fl (ref 78.0–100.0)
Monocytes Absolute: 0.5 10*3/uL (ref 0.1–1.0)
Monocytes Relative: 6.5 % (ref 3.0–12.0)
RBC: 3.92 Mil/uL (ref 3.87–5.11)
WBC: 7.3 10*3/uL (ref 4.5–10.5)

## 2012-04-13 NOTE — Telephone Encounter (Signed)
Patient given results. She will continue her iron supplements.

## 2012-04-13 NOTE — Patient Instructions (Addendum)
Your physician has requested that you go to the basement for the following lab work before leaving today: CBC  Please follow up as needed.  CC:Dr Panosh

## 2012-04-13 NOTE — Progress Notes (Signed)
Joanne Mccarty 05/16/1965 MRN [id number]   History of Present Illness:  This is a 47 year old white female who is 4 weeks post hemorrhoidal band ligation x 2. She was having severe rectal pain for about 24 hours but then the pain subsided and the bleeding has gradually subsided as well. She is back to running 4 times a week but has not noticed any abdominal pain or rectal bleeding. She has been on iron supplements because of iron deficiency anemia. She had a rectocele repair in the past and she had a recent colonoscopy which did not show any polyps.   Past Medical History  Diagnosis Date  . History of chickenpox   . Abdominal bruit     fem neg doppler exam  . Hypothyroidism   . Anemia   . History of hypertension 1998    resolved  . Asthma     exercise induced-weather  . PONV (postoperative nausea and vomiting)   . Hx of pyelonephritis 2013    after surgery culture negative   . Rectal pressure 06/04/2011    while running   . Metatarsal stress fracture 07/22/2011    Based on scan and exam 80% likelihood of stress fracture but appears early   . Galactorrhea 06/21/2010  . Internal hemorrhoids    Past Surgical History  Procedure Date  . Dysplastic lesion      x 3 4/08  . Endometrial ablation april 2009  . Breast surgery     left bx  . Novasure ablation   . Dilation and curettage of uterus 12/2007  . Robotic cystectomy 02/2010  . Vaginal hysterectomy 09/08/2011    Procedure: HYSTERECTOMY VAGINAL;  Surgeon: Esmeralda Arthur, MD;  Location: [facility];  Service: Gynecology;  Laterality: N/A;  . Rectocele repair 09/08/2011    Procedure: POSTERIOR REPAIR (RECTOCELE);  Surgeon: Esmeralda Arthur, MD;  Location: [facility];  Service: Gynecology;;  . Colonoscopy 03/10/2012    Procedure: COLONOSCOPY;  Surgeon: Hart Carwin, MD;  Location: [facility];  Service: Endoscopy;  Laterality: N/A;  . Hemorrhoid banding 03/10/2012    Procedure: HEMORRHOID BANDING;  Surgeon: Hart Carwin, MD;  Location: [facility];  Service: Endoscopy;  Laterality: N/A;    reports that she quit smoking about 24 years ago. Her smoking use included Cigarettes. She quit after 5 years of use. She has never used smokeless tobacco. She reports that she drinks about 8.4 ounces of alcohol per week. She reports that she does not use illicit drugs. family history includes Crohn's disease in her brother; Diabetes in her maternal grandfather and maternal uncle; Hyperlipidemia in her father; Hypertension in her mother; Hypothyroidism in her son; and Melanoma in her mother.  There is no history of Colon cancer. Allergies  Allergen Reactions  . Cephalexin     REACTION: rash        Review of Systems:  The remainder of the 10 point ROS is negative except as outlined in H&P     Assessment and Plan:  Problem #1 Successful hemorrhoidal band ligation 4 weeks ago. The rectal bleeding has stopped. She still has problems with occasional prolapse of rectal tissue in small amounts. However, she is improved. We will recheck her blood count today and I will see her on a when necessary basis.   04/13/2012 Lina Sar

## 2012-05-02 ENCOUNTER — Ambulatory Visit (HOSPITAL_COMMUNITY): Admission: RE | Admit: 2012-05-02 | Payer: PRIVATE HEALTH INSURANCE | Source: Ambulatory Visit

## 2012-05-06 ENCOUNTER — Ambulatory Visit (HOSPITAL_COMMUNITY): Payer: PRIVATE HEALTH INSURANCE

## 2012-05-17 ENCOUNTER — Ambulatory Visit (HOSPITAL_COMMUNITY)
Admission: RE | Admit: 2012-05-17 | Discharge: 2012-05-17 | Disposition: A | Discharge status: Home / Self Care | Payer: PRIVATE HEALTH INSURANCE | Source: Ambulatory Visit | Attending: Obstetrics and Gynecology | Admitting: Obstetrics and Gynecology

## 2012-05-17 DIAGNOSIS — Z1231 Encounter for screening mammogram for malignant neoplasm of breast: Secondary | ICD-10-CM | POA: Insufficient documentation

## 2012-05-26 ENCOUNTER — Telehealth: Payer: Self-pay | Admitting: *Deleted

## 2012-05-26 NOTE — Telephone Encounter (Signed)
Message copied by Daphine Deutscher on Thu May 26, 2012 10:23 AM ------      Message from: Daphine Deutscher      Created: Thu Mar 31, 2012  8:45 AM       Call and remind patient due for CBC, IBC panel DB ------

## 2012-05-26 NOTE — Telephone Encounter (Signed)
Patient notified of labs due. She will come in for this.

## 2012-05-26 NOTE — Telephone Encounter (Signed)
Left a message for patient to call me. 

## 2012-05-27 ENCOUNTER — Other Ambulatory Visit (INDEPENDENT_AMBULATORY_CARE_PROVIDER_SITE_OTHER): Payer: PRIVATE HEALTH INSURANCE

## 2012-05-27 DIAGNOSIS — D649 Anemia, unspecified: Secondary | ICD-10-CM

## 2012-05-27 LAB — CBC WITH DIFFERENTIAL/PLATELET
Basophils Relative: 0.6 % (ref 0.0–3.0)
Eosinophils Absolute: 0.2 10*3/uL (ref 0.0–0.7)
Eosinophils Relative: 3.6 % (ref 0.0–5.0)
HCT: 35.6 % — ABNORMAL LOW (ref 36.0–46.0)
Lymphs Abs: 1.4 10*3/uL (ref 0.7–4.0)
MCHC: 33.1 g/dL (ref 30.0–36.0)
MCV: 86.3 fl (ref 78.0–100.0)
Monocytes Absolute: 0.4 10*3/uL (ref 0.1–1.0)
Neutrophils Relative %: 63.7 % (ref 43.0–77.0)
Platelets: 283 10*3/uL (ref 150.0–400.0)
RBC: 4.13 Mil/uL (ref 3.87–5.11)

## 2012-05-27 LAB — IBC PANEL: Transferrin: 317.6 mg/dL (ref 212.0–360.0)

## 2013-01-16 ENCOUNTER — Other Ambulatory Visit (INDEPENDENT_AMBULATORY_CARE_PROVIDER_SITE_OTHER): Payer: PRIVATE HEALTH INSURANCE

## 2013-01-16 DIAGNOSIS — Z0000 Encounter for general adult medical examination without abnormal findings: Secondary | ICD-10-CM

## 2013-01-16 LAB — HEPATIC FUNCTION PANEL
ALT: 21 U/L (ref 0–35)
Bilirubin, Direct: 0.1 mg/dL (ref 0.0–0.3)
Total Bilirubin: 0.9 mg/dL (ref 0.3–1.2)

## 2013-01-16 LAB — CBC WITH DIFFERENTIAL/PLATELET
Basophils Absolute: 0 10*3/uL (ref 0.0–0.1)
Eosinophils Absolute: 0.2 10*3/uL (ref 0.0–0.7)
Eosinophils Relative: 3.5 % (ref 0.0–5.0)
HCT: 38.8 % (ref 36.0–46.0)
Lymphs Abs: 1.5 10*3/uL (ref 0.7–4.0)
MCHC: 33.5 g/dL (ref 30.0–36.0)
MCV: 95 fl (ref 78.0–100.0)
Monocytes Absolute: 0.4 10*3/uL (ref 0.1–1.0)
Neutrophils Relative %: 61.4 % (ref 43.0–77.0)
Platelets: 251 10*3/uL (ref 150.0–400.0)
RDW: 12.9 % (ref 11.5–14.6)
WBC: 5.4 10*3/uL (ref 4.5–10.5)

## 2013-01-16 LAB — BASIC METABOLIC PANEL
BUN: 19 mg/dL (ref 6–23)
CO2: 29 mEq/L (ref 19–32)
Chloride: 103 mEq/L (ref 96–112)
Creatinine, Ser: 0.8 mg/dL (ref 0.4–1.2)
Glucose, Bld: 82 mg/dL (ref 70–99)
Potassium: 4.2 mEq/L (ref 3.5–5.1)

## 2013-01-16 LAB — LIPID PANEL
Cholesterol: 193 mg/dL (ref 0–200)
LDL Cholesterol: 95 mg/dL (ref 0–99)
Triglycerides: 47 mg/dL (ref 0.0–149.0)
VLDL: 9.4 mg/dL (ref 0.0–40.0)

## 2013-01-16 LAB — TSH: TSH: 2.05 u[IU]/mL (ref 0.35–5.50)

## 2013-01-25 ENCOUNTER — Ambulatory Visit (INDEPENDENT_AMBULATORY_CARE_PROVIDER_SITE_OTHER): Payer: PRIVATE HEALTH INSURANCE | Admitting: Internal Medicine

## 2013-01-25 ENCOUNTER — Encounter: Payer: Self-pay | Admitting: Internal Medicine

## 2013-01-25 VITALS — BP 116/72 | HR 57 | Temp 98.2°F | Ht 66.0 in | Wt 128.0 lb

## 2013-01-25 DIAGNOSIS — R911 Solitary pulmonary nodule: Secondary | ICD-10-CM

## 2013-01-25 DIAGNOSIS — R1013 Epigastric pain: Secondary | ICD-10-CM

## 2013-01-25 DIAGNOSIS — Z0000 Encounter for general adult medical examination without abnormal findings: Secondary | ICD-10-CM

## 2013-01-25 DIAGNOSIS — Z23 Encounter for immunization: Secondary | ICD-10-CM

## 2013-01-25 DIAGNOSIS — J45990 Exercise induced bronchospasm: Secondary | ICD-10-CM

## 2013-01-25 DIAGNOSIS — E039 Hypothyroidism, unspecified: Secondary | ICD-10-CM

## 2013-01-25 DIAGNOSIS — G4700 Insomnia, unspecified: Secondary | ICD-10-CM

## 2013-01-25 MED ORDER — SYNTHROID 88 MCG PO TABS: 88.0000 ug | ORAL_TABLET | Freq: Every day | ORAL | Status: DC

## 2013-01-25 MED ORDER — ZOLPIDEM TARTRATE 5 MG PO TABS
5.0000 mg | ORAL_TABLET | Freq: Every evening | ORAL | Status: DC | PRN
Start: 2013-01-25 — End: 2014-01-23

## 2013-01-25 NOTE — Progress Notes (Signed)
Chief Complaint  Patient presents with  . Annual Exam    HPI: Patient comes in today for Preventive Health Care visit   Brand synthroid.  No problems  Needs refill   Asthma allergy uses  Pre marathon.   Once a month after fatty meal bad mid epigatrum pain and radiating to back last hours .  ? If gb burt perfectly well in between   hh of 5  Busy  Not working outside professionally   Post prandial hypoglycmeia ever since young   If has high sugar meal gets shaking after  Hx of  bg45 range at times .  No fasting sx .   Due for fu pulmonary nodule no sx  See last ct . No sx   ambien rare use: 1/4  About once a month.  Would like refill in case  ROS:  GEN/ HEENT: No fever, significant weight changes sweats headaches vision problems hearing changes, CV/ PULM; No chest pain shortness of breath cough, syncope,edema  change in exercise tolerance. GI /GU: No adominal pain, vomiting, change in bowel habits. No blood in the stool. No significant GU symptoms. SKIN/HEME: ,no acute skin rashes suspicious lesions or bleeding. No lymphadenopathy, nodules, masses.  NEURO/ PSYCH:  No neurologic signs such as weakness numbness. No depression anxiety. IMM/ Allergy: No unusual infections.  Allergy .   REST of 12 system review negative except as per HPI   Past Medical History  Diagnosis Date  . History of chickenpox   . Abdominal bruit     fem neg doppler exam  . Hypothyroidism   . Anemia   . History of hypertension 1998    resolved  . Asthma     exercise induced-weather  . PONV (postoperative nausea and vomiting)   . Hx of pyelonephritis 2013    after surgery culture negative   . Rectal pressure 06/04/2011    while running   . Metatarsal stress fracture 07/22/2011    Based on scan and exam 80% likelihood of stress fracture but appears early   . Galactorrhea 06/21/2010  . Internal hemorrhoids     Family History  Problem Relation Age of Onset  . Hypertension Mother   . Melanoma Mother    . Hyperlipidemia Father   . Crohn's disease Brother   . Hypothyroidism Son     2011  . Diabetes Maternal Grandfather   . Colon cancer Neg Hx   . Diabetes Maternal Uncle     History   Social History  . Marital Status: Married    Spouse Name: N/A    Number of Children: 3  . Years of Education: N/A   Occupational History  .     Social History Main Topics  . Smoking status: Former Smoker -- 5 years    Types: Cigarettes    Quit date: 09/03/1987  . Smokeless tobacco: Never Used  . Alcohol Use: 8.4 oz/week    14 Glasses of wine per week  . Drug Use: No  . Sexual Activity: Not Currently    Birth Control/ Protection: Surgical     Comment: VAS AND HYST   Other Topics Concern  . 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   G3P3   No tob   2 etoh    2 caffiene per day  Outpatient Encounter Prescriptions as of 01/25/2013  Medication Sig Dispense Refill  . acetaminophen (TYLENOL) 500 MG tablet Take 1,000 mg by mouth every 6 (six) hours as needed. For pain, fever      . albuterol (PROVENTIL HFA;VENTOLIN HFA) 108 (90 BASE) MCG/ACT inhaler Inhale 1-2 puffs into the lungs every 6 (six) hours as needed. For exercise induced asthma symptoms.      . hyoscyamine (LEVSIN/SL) 0.125 MG SL tablet Dissolve 1 tablet under the tongue after running as needed for abdominal cramping  30 tablet  0  . ibuprofen (ADVIL,MOTRIN) 400 MG tablet Take 400 mg by mouth every 6 (six) hours as needed. For pain      . SYNTHROID 88 MCG tablet Take 1 tablet (88 mcg total) by mouth daily.  90 tablet  3  . zolpidem (AMBIEN) 5 MG tablet Take 1 tablet (5 mg total) by mouth at bedtime as needed for sleep.  30 tablet  0  . [DISCONTINUED] SYNTHROID 88 MCG tablet Take 1 tablet (88 mcg total) by mouth daily.  90 tablet  3  . [DISCONTINUED] zolpidem (AMBIEN) 5 MG tablet Take 1  tablet (5 mg total) by mouth at bedtime as needed for sleep.  30 tablet  0  . [DISCONTINUED] hydrocortisone (ANUSOL-HC) 25 MG suppository Place 1 suppository (25 mg total) rectally at bedtime as needed for hemorrhoids.  12 suppository  1   No facility-administered encounter medications on file as of 01/25/2013.    EXAM:  BP 116/72  Pulse 57  Temp(Src) 98.2 F (36.8 C) (Oral)  Ht 5\' 6"  (1.676 m)  Wt 128 lb (58.06 kg)  BMI 20.67 kg/m2  SpO2 99%  LMP 08/13/2011  Body mass index is 20.67 kg/(m^2).  Physical Exam: Vital signs reviewed JXB:JYNW is a well-developed well-nourished alert cooperative   female who appears her stated age in no acute distress.  HEENT: normocephalic atraumatic , Eyes: PERRL EOM's nl, conjunctiva clear, Nares: paten,t no deformity discharge  congested ., Ears: no deformity EAC's clear TMs with normal landmarks. Mouth: clear OP, no lesions, edema.  Moist mucous membranes. Dentition in adequate repair. NECK: supple without masses,or bruits. CHEST/PULM:  Clear to auscultation and percussion breath sounds equal no wheeze , rales or rhonchi. No chest wall deformities or tenderness. CV: PMI is nondisplaced, S1 S2 no gallops, murmurs, rubs. Peripheral pulses are full without delay.No JVD .  Breast: normal by inspection . No dimpling, discharge, masses, tenderness or discharge . ABDOMEN: Bowel sounds normal nontender  No guard or rebound, no hepato splenomegal no CVA tenderness.  No hernia. Extremtities:  No clubbing cyanosis or edema, no acute joint swelling or redness no focal atrophy NEURO:  Oriented x3, cranial nerves 3-12 appear to be intact, no obvious focal weakness,gait within normal limits no abnormal reflexes or asymmetrical SKIN: No acute rashes normal turgor, color, no bruising or petechiae. Sun changes  PSYCH: Oriented, good eye contact, no obvious depression anxiety, cognition and judgment appear normal. LN: no cervical axillary i adenopathy  Lab Results   Component Value Date   WBC 5.4 01/16/2013   HGB 13.0 01/16/2013   HCT 38.8 01/16/2013   PLT 251.0 01/16/2013   GLUCOSE 82 01/16/2013   CHOL 193 01/16/2013   TRIG 47.0 01/16/2013   HDL 88.80 01/16/2013   LDLCALC 95 01/16/2013   ALT 21 01/16/2013   AST 26 01/16/2013   NA 138 01/16/2013   K 4.2 01/16/2013   CL 103 01/16/2013   CREATININE 0.8 01/16/2013   BUN  19 01/16/2013   CO2 29 01/16/2013   TSH 2.05 01/16/2013    ASSESSMENT AND PLAN:  Discussed the following assessment and plan:  Visit for preventive health examination  Unspecified hypothyroidism - refill brand medication  Need for prophylactic vaccination and inoculation against influenza - Plan: Flu Vaccine QUAD 36+ mos PF IM (Fluarix)  Need for prophylactic vaccination with combined diphtheria-tetanus-pertussis (DTP) vaccine - Plan: Tdap vaccine greater than or equal to 7yo IM  EXERCISE INDUCED ASTHMA  INSOMNIA UNSPECIFIED - limited use of med  Incidental pulmonary nodule, > 3mm and < 8mm - fu ct for december as advised  by radiology - Plan: CT Chest Wo Contrast  Abdominal pain, epigastric - episodic pp related to diet ;consider [place] if continuing   Patient Care Team: Madelin Headings, MD as PCP - General Esmeralda Arthur, MD (Obstetrics and Gynecology) Dorisann Frames, MD (Endocrinology) Patient Instructions    Contact us if recurrent abd pain and consider getting  Abdominal ultraound.  Will make sure  Fu ct for pulm nodule is arranged. If post prandial issues with sugar are profgressive we can do a GTT but dont think necessary at this time.   Continue lifestyle intervention healthy eating and exercise .  Yearly check    Bone Health Our bones do many things. They provide structure, protect organs, anchor muscles, and store calcium. Adequate calcium in your diet and weight-bearing physical activity help build strong bones, improve bone amounts, and may reduce the risk of weakening of bones (osteoporosis) later  in life. PEAK BONE MASS By age 85, the average woman has acquired most of her skeletal bone mass. A large decline occurs in older adults which increases the risk of osteoporosis. In women this occurs around the time of menopause. It is important for young girls to reach their peak bone mass in order to maintain bone health throughout life. A person with high bone mass as a young adult will be more likely to have a higher bone mass later in life. Not enough calcium consumption and physical activity early on could result in a failure to achieve optimum bone mass in adulthood. OSTEOPOROSIS Osteoporosis is a disease of the bones. It is defined as low bone mass with deterioration of bone structure. Osteoporosis leads to an increase risk of fractures with falls. These fractures commonly happen in the wrist, hip, and spine. While men and women of all ages and background can develop osteoporosis, some of the risk factors for osteoporosis are:  Female.  White.  Postmenopausal.  Older adults.  Small in body size.  Eating a diet low in calcium.  Physically inactive.  Smoking.  Use of some medications such as prednisone.  Family history. CALCIUM Calcium is a mineral needed by the body for healthy bones, teeth, and proper function of the heart, muscles, and nerves. The body cannot produce calcium so it must be absorbed through food. Good sources of calcium include:  Dairy products (low fat or nonfat milk, cheese, and yogurt).  Dark green leafy vegetables (bok choy and broccoli).  Calcium fortified foods (orange juice, cereal, bread, soy beverages, and tofu products).  Nuts (almonds). Recommended amounts of calcium vary for individuals. Below is a guide for how much calcium you should take in daily, as outlined by the [employer]. RECOMMENDED CALCIUM INTAKES Age and Amount in mg per day  Birth - 6 months...........210 mg  6 months - 1 year.........270 mg  1 - 3  years....................500 mg  4 -  8 years....................800 mg  9 - 13 years................1300 mg  14 - 18 years..............1300 mg  19 - 30 years..............1000 mg  31 - 50 years..............1000 mg  51 - 70 years..............1200 mg  70 or older.................1200 mg  Pregnant & lactating...1000 mg  14 - 18 years..............1300 mg  19 - 50 years..............1000 mg Vitamin D also plays an important role in healthy bone development. Vitamin D helps in the absorption of calcium (this is why milk is fortified with vitamin D). For more information on calcium and children visit the [employer] of Child Health and Merchandiser, retail (NICHD) Website at MomsWeek.ca. WEIGHT-BEARING PHYSICAL ACTIVITY Regular physical activity has many positive health benefits. Benefits include strong bones. Weight-bearing physical activity early in life is important in reaching peak bone mass. Weight-bearing physical activities cause muscles and bones to work against gravity. Some examples of weight bearing physical activities include:  Walking, jogging, or running.  DIRECTV.  Jumping rope.  Dancing.  Soccer.  Tennis or Racquetball.  Stair climbing.  Basketball.  Hiking.  Weight lifting.  Aerobic fitness classes. Including weight-bearing physical activity into an exercise plan is a great way to keep bones healthy and meet physical activity recommendations set forth in the Dietary Guidelines for Americans. Adults: Engage in at least 30 minutes of moderate physical activity on most, preferably all, days of the week. Children: Engage in at least 60 minutes of moderate physical activity on most, preferably all, days of the week. FOR MORE INFORMATION [place] An[employer]ator, Oceanographer for [employer] and Promotion: www.cnpp.usda.gov National Osteoporosis Foundation: RecruitSuit.ca Document Released: 06/06/2003 Document  Revised: 06/08/2011 Document Reviewed: 09/05/2008 [facility] Patient Information 2014 [place], [place].     Neta Mends. Helio Lack M.D. Health Maintenance  Topic Date Due  . Influenza Vaccine  10/28/2013  . Pap Smear  08/21/2014  . Tetanus/tdap  01/26/2023   Health Maintenance Review

## 2013-01-25 NOTE — Patient Instructions (Addendum)
Contact us if recurrent abd pain and consider getting  Abdominal ultraound.  Will make sure  Fu ct for pulm nodule is arranged. If post prandial issues with sugar are profgressive we can do a GTT but dont think necessary at this time.   Continue lifestyle intervention healthy eating and exercise .  Yearly check    Bone Health Our bones do many things. They provide structure, protect organs, anchor muscles, and store calcium. Adequate calcium in your diet and weight-bearing physical activity help build strong bones, improve bone amounts, and may reduce the risk of weakening of bones (osteoporosis) later in life. PEAK BONE MASS By age 52, the average woman has acquired most of her skeletal bone mass. A large decline occurs in older adults which increases the risk of osteoporosis. In women this occurs around the time of menopause. It is important for young girls to reach their peak bone mass in order to maintain bone health throughout life. A person with high bone mass as a young adult will be more likely to have a higher bone mass later in life. Not enough calcium consumption and physical activity early on could result in a failure to achieve optimum bone mass in adulthood. OSTEOPOROSIS Osteoporosis is a disease of the bones. It is defined as low bone mass with deterioration of bone structure. Osteoporosis leads to an increase risk of fractures with falls. These fractures commonly happen in the wrist, hip, and spine. While men and women of all ages and background can develop osteoporosis, some of the risk factors for osteoporosis are:  Female.  White.  Postmenopausal.  Older adults.  Small in body size.  Eating a diet low in calcium.  Physically inactive.  Smoking.  Use of some medications such as prednisone.  Family history. CALCIUM Calcium is a mineral needed by the body for healthy bones, teeth, and proper function of the heart, muscles, and nerves. The body cannot produce  calcium so it must be absorbed through food. Good sources of calcium include:  Dairy products (low fat or nonfat milk, cheese, and yogurt).  Dark green leafy vegetables (bok choy and broccoli).  Calcium fortified foods (orange juice, cereal, bread, soy beverages, and tofu products).  Nuts (almonds). Recommended amounts of calcium vary for individuals. Below is a guide for how much calcium you should take in daily, as outlined by the [employer]. RECOMMENDED CALCIUM INTAKES Age and Amount in mg per day  Birth - 6 months...........210 mg  6 months - 1 year.........270 mg  1 - 3 years....................500 mg  4 - 8 years....................800 mg  9 - 13 years................1300 mg  14 - 18 years..............1300 mg  19 - 30 years..............1000 mg  31 - 50 years..............1000 mg  51 - 70 years..............1200 mg  70 or older.................1200 mg  Pregnant & lactating...1000 mg  14 - 18 years..............1300 mg  19 - 50 years..............1000 mg Vitamin D also plays an important role in healthy bone development. Vitamin D helps in the absorption of calcium (this is why milk is fortified with vitamin D). For more information on calcium and children visit the [employer] of Child Health and Merchandiser, retail (NICHD) Website at MomsWeek.ca. WEIGHT-BEARING PHYSICAL ACTIVITY Regular physical activity has many positive health benefits. Benefits include strong bones. Weight-bearing physical activity early in life is important in reaching peak bone mass. Weight-bearing physical activities cause muscles and bones to work against gravity. Some examples of weight bearing physical activities include:  Walking, jogging, or running.  [employer].  Jumping rope.  Dancing.  Soccer.  Tennis or Racquetball.  Stair climbing.  Basketball.  Hiking.  Weight lifting.  Aerobic fitness classes. Including  weight-bearing physical activity into an exercise plan is a great way to keep bones healthy and meet physical activity recommendations set forth in the Dietary Guidelines for Americans. Adults: Engage in at least 30 minutes of moderate physical activity on most, preferably all, days of the week. Children: Engage in at least 60 minutes of moderate physical activity on most, preferably all, days of the week. FOR MORE INFORMATION [place] An[employer]ator, Oceanographer for [employer] and Promotion: www.cnpp.usda.gov National Osteoporosis Foundation: [employer]ca Document Released: 06/06/2003 Document Revised: 06/08/2011 Document Reviewed: 09/05/2008 [facility] Patient Information 2014 [place], [place].

## 2013-02-10 ENCOUNTER — Other Ambulatory Visit (HOSPITAL_COMMUNITY): Payer: Self-pay | Admitting: Obstetrics and Gynecology

## 2013-02-10 ENCOUNTER — Ambulatory Visit (HOSPITAL_COMMUNITY)
Admission: RE | Admit: 2013-02-10 | Discharge: 2013-02-10 | Disposition: A | Discharge status: Home / Self Care | Payer: PRIVATE HEALTH INSURANCE | Source: Ambulatory Visit | Attending: Obstetrics and Gynecology | Admitting: Obstetrics and Gynecology

## 2013-02-10 DIAGNOSIS — M79609 Pain in unspecified limb: Secondary | ICD-10-CM

## 2013-02-10 DIAGNOSIS — R209 Unspecified disturbances of skin sensation: Secondary | ICD-10-CM | POA: Insufficient documentation

## 2013-02-10 NOTE — Progress Notes (Signed)
*  PRELIMINARY RESULTS* Vascular Ultrasound Left lower extremity venous duplex has been completed.  Preliminary findings: no evidence of DVT or baker's cyst.  Called report to [facility].   Farrel Demark, RDMS, RVT  02/10/2013, 3:05 PM

## 2013-02-17 ENCOUNTER — Ambulatory Visit (INDEPENDENT_AMBULATORY_CARE_PROVIDER_SITE_OTHER): Payer: PRIVATE HEALTH INSURANCE | Admitting: Family Medicine

## 2013-02-17 ENCOUNTER — Encounter: Payer: Self-pay | Admitting: Family Medicine

## 2013-02-17 ENCOUNTER — Ambulatory Visit
Admission: RE | Admit: 2013-02-17 | Discharge: 2013-02-17 | Disposition: A | Discharge status: Home / Self Care | Payer: PRIVATE HEALTH INSURANCE | Source: Ambulatory Visit | Attending: Family Medicine | Admitting: Family Medicine

## 2013-02-17 VITALS — BP 100/60 | Ht 66.0 in | Wt 127.0 lb

## 2013-02-17 DIAGNOSIS — IMO0002 Reserved for concepts with insufficient information to code with codable children: Secondary | ICD-10-CM

## 2013-02-17 DIAGNOSIS — S76012A Strain of muscle, fascia and tendon of left hip, initial encounter: Secondary | ICD-10-CM

## 2013-02-17 DIAGNOSIS — S86112A Strain of other muscle(s) and tendon(s) of posterior muscle group at lower leg level, left leg, initial encounter: Secondary | ICD-10-CM

## 2013-02-17 DIAGNOSIS — M79609 Pain in unspecified limb: Secondary | ICD-10-CM

## 2013-02-17 DIAGNOSIS — M79671 Pain in right foot: Secondary | ICD-10-CM

## 2013-02-17 DIAGNOSIS — M79669 Pain in unspecified lower leg: Secondary | ICD-10-CM

## 2013-02-17 DIAGNOSIS — S838X9A Sprain of other specified parts of unspecified knee, initial encounter: Secondary | ICD-10-CM

## 2013-02-17 NOTE — Patient Instructions (Signed)
Thank you for coming in today  Followup in 4 weeks  For calf strain - Continue compression while running and for 30-60 min after - Do eccentric heel raises 3 x 20 per day - You can run as long as you have less than 3/10 pain and are not limping  For hip flexor strain - Standing hip rotation 3 x 15 - Laying hip abduction 3 x 15 - Hip extension straight back and at 45 degrees - 3 x 10 of each  For foot pain, - no evidence of stress fracture on U/S - xrays to confirm - get a good cushioned insole - consider orthotics in the future

## 2013-02-20 ENCOUNTER — Telehealth: Payer: Self-pay | Admitting: *Deleted

## 2013-02-20 NOTE — Telephone Encounter (Signed)
Discussed with pt- pt agreeable.

## 2013-02-20 NOTE — Progress Notes (Signed)
Reason for visit: Several complaints including left calf pain, left hip pain, and right foot pain  Subjective: Patient is a very pleasant 47 year old female on distance runner who presents for evaluation of the above complaints.  She states that she has had some nagging injuries over the last couple months.  She ran a marathon one month ago.  When she was out running 2 weeks ago she started to notice pain in her left calf.  She had pain that was followed by bruising and swelling.  She did rest for about a week but every time she tries to run her calf gets tight.  Overall it is improving.  Her primary doctor was concerned and ordered an ultrasound was negative for DVT.  She states her pain is worse with speed work.  She has tried heat, ice, and Motrin.  She was able to complete 7 miles a day prior to her appointment but is sore today.  She is wondering if there is any sort of stretching or rehabilitation exercises she should be doing.  Her next complaint is left hip pain.  This has been going on for about 2 months.  The pain starts at her posterior hip and wraps all the way around to her groin.  Hurts to kick a ball with her son.  Also bothers her running.  Finally, she is complaining of right foot pain that started 2 months ago.  She states she had a stress fracture last year and she is worried that she has another.  The pain is on the plantar aspect of her foot at the area of the second metatarsal head.  She feels like there is a nail under her foot.  She has some discomfort with walking.  No swelling or bruising.  Review of systems as related to current complaint reviewed and unchanged.  Objective:The patient is well-developed well-nourished female and in no acute distress.  She is awake alert and oriented x3.  Extraocular motion is intact.  No use of accessory muscles for breathing.  No skin rash. Left calf: There is no swelling or ecchymosis.  Full range of motion in dorsiflexion and plantar flexion with  5 out of 5 strength.  No palpable defect.  No tenderness to palpation. Left hip: Patient has full range of motion in the hip without pain. Negative FADIR.  Negative Faber test.  She has 5 out of 5 strength with pain on resisted hip flexion and tenderness to palpation over the hip flexor tendon.  Hip abduction strength is decreased on the left as compared to the right.  Hip extension strength also slightly less on the left as compared to the right.   Right foot: No swelling or ecchymosis.  No tenderness to palpation of the metatarsals on the dorsum of the foot.  She is really nontender on the plantar aspect of the foot as well.  Negative metatarsal squeeze test.  Ultrasound: Limited ultrasound of the right foot was performed in transverse and longitudinal views.  The second and third metatarsal visualized over the dorsum and plantar aspect of the foot.  There is no disruption of the cortex or hypoechoic change surrounding the bone and transverse or longitudinal views.  Assessment: #1.  Left calf strain #2.  Left hip flexor strain #3.  Right foot pain, suspect metatarsalgia.  Low suspicion for stress fracture  Plan: #1.  Patient was fitted with a calf muscle compression sleeve.  She was given some eccentric calf raises for rehabilitation.  Continue to  work back into running as tolerated #2.  Ice and NSAIDs as needed.  Standing hip rotation, hip abduction, and hip extension exercises instructed #3.  Low suspicion for metatarsal stress fracture given an exam and negative ultrasound.  We'll order a confirmatory x-ray.  Recommended the patient obtain good cushioned over-the-counter support insoles.  Return if pain continues for recheck.

## 2013-02-20 NOTE — Telephone Encounter (Signed)
Message copied by Mora Bellman on Mon Feb 20, 2013  8:41 AM ------      Message from: Daine Gip      Created: Mon Feb 20, 2013  8:26 AM       Please notify Ms. Virtue that her foot xray also did not show any sign of stress fracture. She should obtain the cushioned insoles and return if her pain continues or worsens over the next 3 weeks ------

## 2013-03-08 ENCOUNTER — Ambulatory Visit (INDEPENDENT_AMBULATORY_CARE_PROVIDER_SITE_OTHER)
Admission: RE | Admit: 2013-03-08 | Discharge: 2013-03-08 | Disposition: A | Discharge status: Home / Self Care | Payer: PRIVATE HEALTH INSURANCE | Source: Ambulatory Visit | Attending: Internal Medicine | Admitting: Internal Medicine

## 2013-03-08 DIAGNOSIS — R911 Solitary pulmonary nodule: Secondary | ICD-10-CM

## 2013-03-10 ENCOUNTER — Telehealth: Payer: Self-pay | Admitting: Internal Medicine

## 2013-03-10 NOTE — Telephone Encounter (Signed)
Pt would like results of ct scan, asap.

## 2013-03-13 NOTE — Telephone Encounter (Signed)
Results are not in the system at this time.  Will forward to [facility].

## 2013-03-16 NOTE — Telephone Encounter (Signed)
I did not receive a result note.  Please advise.  Thanks!

## 2013-03-16 NOTE — Telephone Encounter (Signed)
Please see result note 

## 2013-03-21 NOTE — Telephone Encounter (Signed)
The result note shows that you notified her or result  So I dont know why this note is here .   i printed it out an put on your desk to make sure patient really got results.

## 2013-03-22 NOTE — Telephone Encounter (Signed)
Talked with pt and she did get results

## 2013-04-21 ENCOUNTER — Telehealth: Payer: Self-pay | Admitting: Internal Medicine

## 2013-04-21 MED ORDER — HYDROCORTISONE ACETATE 25 MG RE SUPP: RECTAL | Status: DC

## 2013-04-21 NOTE — Telephone Encounter (Signed)
Patient saw Dr. Olevia Perches a year ago for rectal bleeding when running. She had been doing well until 4 weeks ago. For the last 4 weeks, she reports bleeding when running. States it is more than it was last year. Wondering if she should see a Psychologist, sport and exercise. Please, advise.

## 2013-04-21 NOTE — Telephone Encounter (Signed)
R.x sent to pharmacy. Spoke with patient and gave her Dr. Olevia Perches recommendations.

## 2013-04-21 NOTE — Telephone Encounter (Signed)
Hemorrhoids banded 1 year ago, please restart Anusol HC supp and Sitz baths bid, If no improvement in 2-3 weeks, would, see sugeons

## 2013-04-25 ENCOUNTER — Other Ambulatory Visit: Payer: Self-pay | Admitting: Obstetrics and Gynecology

## 2013-04-25 DIAGNOSIS — Z1231 Encounter for screening mammogram for malignant neoplasm of breast: Secondary | ICD-10-CM

## 2013-05-19 ENCOUNTER — Ambulatory Visit (HOSPITAL_COMMUNITY)
Admission: RE | Admit: 2013-05-19 | Discharge: 2013-05-19 | Disposition: A | Discharge status: Home / Self Care | Payer: PRIVATE HEALTH INSURANCE | Source: Ambulatory Visit | Attending: Obstetrics and Gynecology | Admitting: Obstetrics and Gynecology

## 2013-05-19 DIAGNOSIS — Z1231 Encounter for screening mammogram for malignant neoplasm of breast: Secondary | ICD-10-CM | POA: Insufficient documentation

## 2013-06-23 ENCOUNTER — Encounter: Payer: Self-pay | Admitting: Sports Medicine

## 2013-06-23 ENCOUNTER — Ambulatory Visit (INDEPENDENT_AMBULATORY_CARE_PROVIDER_SITE_OTHER): Payer: PRIVATE HEALTH INSURANCE | Admitting: Sports Medicine

## 2013-06-23 VITALS — BP 111/68 | Ht 66.0 in | Wt 129.0 lb

## 2013-06-23 DIAGNOSIS — M629 Disorder of muscle, unspecified: Secondary | ICD-10-CM

## 2013-06-23 DIAGNOSIS — S93699A Other sprain of unspecified foot, initial encounter: Secondary | ICD-10-CM

## 2013-06-23 DIAGNOSIS — M722 Plantar fascial fibromatosis: Secondary | ICD-10-CM

## 2013-06-23 NOTE — Assessment & Plan Note (Signed)
Acute plantar fascial rupture this morning. She was given crutches, encouraged to ice the region. She was also given a body helix compression sleeve.  She is appointment with Dr. Oneida Alar in 3 weeks and will followup at that time. Patient declined prescription pain medication at this time. Advised to rest and advance weightbearing as tolerated after a period of rest

## 2013-06-23 NOTE — Progress Notes (Signed)
Patient ID: Joanne Mccarty, female   DOB: Feb 10, 1966, 48 y.o.   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 Occurred while playing tennis this morning. Pain bearing weight. Unable to continue playing. States she felt something pop. Over the past several weeks she has had bilateral heel pain worse in the morning. Her activity is changed in that she has started running more again after a brief break over the winter. Patient denies weakness, just pain.  Pertinent past medical history: Exercise-induced asthma  Social history: Former smoker, occasional alcohol  Review of systems: As per history of present illness otherwise all systems negative  Examination: BP 111/68  Ht 5\' 6"  (1.676 m)  Wt 129 lb (58.514 kg)  BMI 20.83 kg/m2  LMP 08/13/2011 Well-developed well-nourished 43 show female awake alert oriented no acute distress Left foot/Ankle: No visible erythema or swelling. Range of motion is full in all directions. Strength is 5/5 in all directions. Stable lateral and medial ligaments; squeeze test and kleiger test unremarkable; Talar dome nontender; No pain at base of 5th MT; No tenderness over cuboid; No tenderness over N spot or navicular prominence Negative calcaneal squeeze test No tenderness on posterior aspects of lateral and medial malleolus No sign of peroneal tendon subluxations; Negative tarsal tunnel tinel's Positive tenderness to palpation of the medial calcaneal tuberosity. Normal Thompson test Unable to bear weight on heel in the emergency department.  The skeletal ultrasound of the left plantar fascia was performed. Images obtained were consistent with a partial rupture of the plantar fashion.

## 2013-07-11 ENCOUNTER — Ambulatory Visit (INDEPENDENT_AMBULATORY_CARE_PROVIDER_SITE_OTHER): Payer: PRIVATE HEALTH INSURANCE | Admitting: Sports Medicine

## 2013-07-11 VITALS — BP 107/70 | Ht 66.0 in | Wt 129.0 lb

## 2013-07-11 DIAGNOSIS — M629 Disorder of muscle, unspecified: Secondary | ICD-10-CM

## 2013-07-11 DIAGNOSIS — S93699A Other sprain of unspecified foot, initial encounter: Secondary | ICD-10-CM

## 2013-07-11 DIAGNOSIS — M2420 Disorder of ligament, unspecified site: Secondary | ICD-10-CM

## 2013-07-11 NOTE — Assessment & Plan Note (Signed)
Continue using ankle compression sleeve  She is able to walk slowly in a supportive sports shoes She can use those instead of a cam walker as long as the pain is not severe She can gradually stop using her crutches Continue icing Okay to start crosstraining on the bicycle Recheck in 3-4 weeks to see if she is able to increase her activity

## 2013-07-11 NOTE — Patient Instructions (Signed)
For the next 2 weeks- stick to stationary biking   After 2 weeks- try walk/run on the treadmill- if this does not cause pain ok to do this for the next 2 weeks  Ice daily  Use compression sleeve   Please follow up in 4 weeks  Thank you for seeing [place] today!

## 2013-07-11 NOTE — Progress Notes (Signed)
   Subjective:    Patient ID: Joanne Mccarty, female    DOB: 01-14-1966, 48 y.o.   MRN: [id number]  HPI  Pt presents to clinic for f/u of lt ruptured plantar fascia. Using crutches, compression and cam walker.  Pain has decreased substantially since her last visit She is anxious to do more activity  She had had some chronic plantar fascial type pain before this injury occurred    Review of Systems     Objective:   Physical Exam  No acute distress BP 107/70  Ht 5\' 6"  (1.676 m)  Wt 129 lb (58.514 kg)  BMI 20.83 kg/m2  LMP 08/13/2011  Appearance of the foot is normal bilaterally  she stands with a moderately preserved arch However the medial attachment of the plantar fascia to the calcaneus of the left foot is exquisitely tender No longer swollen or bruised She can walk cautiously without putting full weight on the left foot  Ultrasound There is extensive edema in the left plantar fashion starting at the calcaneal insertion and extending distally about 3 cm A deep hypoechoic area is noted right at the medial calcaneus The plantar fashion measures 0.76 cm 3 cm distal to the heel the plantar fashion measures at 0.3 cm       Assessment & Plan:

## 2013-07-18 ENCOUNTER — Telehealth: Payer: Self-pay | Admitting: *Deleted

## 2013-07-18 NOTE — Telephone Encounter (Signed)
Pt called to let Dr. Oneida Alar know that since her appointment last week, she has been walking without her boot as advised, stationary bike for exercise, icing daily, and wearing body helix compression sleeve except for when sleeping.  She has noticed a significant amount of increased pain since no longer wearing the boot.  Wants to know if this is normal, and should she start back wearing the boot.

## 2013-07-18 NOTE — Telephone Encounter (Addendum)
Per Dr. Oneida Alar advised pt to wean out of boot- ok to wear when foot is sore or tired few hours per day.

## 2013-07-19 ENCOUNTER — Ambulatory Visit: Payer: PRIVATE HEALTH INSURANCE | Admitting: Sports Medicine

## 2013-08-08 ENCOUNTER — Ambulatory Visit (INDEPENDENT_AMBULATORY_CARE_PROVIDER_SITE_OTHER): Payer: PRIVATE HEALTH INSURANCE | Admitting: Sports Medicine

## 2013-08-08 ENCOUNTER — Encounter: Payer: Self-pay | Admitting: Sports Medicine

## 2013-08-08 VITALS — BP 107/73 | Ht 66.0 in | Wt 129.0 lb

## 2013-08-08 DIAGNOSIS — S93699A Other sprain of unspecified foot, initial encounter: Secondary | ICD-10-CM

## 2013-08-08 DIAGNOSIS — M629 Disorder of muscle, unspecified: Secondary | ICD-10-CM

## 2013-08-08 NOTE — Assessment & Plan Note (Signed)
Cont ankle compression Start some easy exercises for calf/ come up on 2 feet on left  Trial two weeks with sports insole with lateral posts This improved gait in office  Bring back for custom orthotics and if this helps add lateral posting

## 2013-08-08 NOTE — Progress Notes (Signed)
Patient ID: Joanne Mccarty, female   DOB: 04-10-1965, 48 y.o.   MRN: [id number]  Patient is making some progress following a LT plantar fascia partial rupture Even before that she had lateral foot pain Sometimes felt some numbness down lateral foot  Since last visit Off crutches Gradually restarting some exercise including some running Use ankle compression Icing  She has tried an OTC insert that is rigid but that increases the lateral foot pain and some numbness occurs  Exam NAD BP 107/73  Ht 5\' 6"  (1.676 m)  Wt 129 lb (58.514 kg)  BMI 20.83 kg/m2  LMP 08/13/2011  Left foot now shows no swelling at medial insertion of PF No TTP Supination with gait Forefoot varus and supination lying prone at 90 deg knee flexion Cuboid is stable  MSK [place] PF is down from 0.76 to 0.60 with less hypoechoic change Cuboid normal Some minor change at base of 5th MT

## 2013-08-22 ENCOUNTER — Encounter: Payer: PRIVATE HEALTH INSURANCE | Admitting: Family Medicine

## 2013-08-24 ENCOUNTER — Ambulatory Visit: Payer: Self-pay | Admitting: Podiatry

## 2013-08-31 ENCOUNTER — Ambulatory Visit (INDEPENDENT_AMBULATORY_CARE_PROVIDER_SITE_OTHER): Payer: PRIVATE HEALTH INSURANCE | Admitting: Sports Medicine

## 2013-08-31 ENCOUNTER — Ambulatory Visit (INDEPENDENT_AMBULATORY_CARE_PROVIDER_SITE_OTHER): Payer: PRIVATE HEALTH INSURANCE

## 2013-08-31 ENCOUNTER — Encounter: Payer: Self-pay | Admitting: Podiatry

## 2013-08-31 ENCOUNTER — Encounter: Payer: Self-pay | Admitting: Sports Medicine

## 2013-08-31 ENCOUNTER — Ambulatory Visit (INDEPENDENT_AMBULATORY_CARE_PROVIDER_SITE_OTHER): Payer: PRIVATE HEALTH INSURANCE | Admitting: Podiatry

## 2013-08-31 VITALS — BP 108/63 | Ht 66.0 in | Wt 129.0 lb

## 2013-08-31 VITALS — BP 121/72 | HR 59 | Resp 16 | Ht 66.0 in | Wt 129.0 lb

## 2013-08-31 DIAGNOSIS — S93699A Other sprain of unspecified foot, initial encounter: Secondary | ICD-10-CM

## 2013-08-31 DIAGNOSIS — M779 Enthesopathy, unspecified: Secondary | ICD-10-CM

## 2013-08-31 DIAGNOSIS — M2040 Other hammer toe(s) (acquired), unspecified foot: Secondary | ICD-10-CM

## 2013-08-31 DIAGNOSIS — M2420 Disorder of ligament, unspecified site: Secondary | ICD-10-CM

## 2013-08-31 DIAGNOSIS — M629 Disorder of muscle, unspecified: Secondary | ICD-10-CM

## 2013-08-31 MED ORDER — TRIAMCINOLONE ACETONIDE 10 MG/ML IJ SUSP
10.0000 mg | Freq: Once | INTRAMUSCULAR | Status: AC
  Administered 2013-08-31: 10 mg

## 2013-08-31 NOTE — Progress Notes (Signed)
   Subjective:    Patient ID: Joanne Mccarty, female    DOB: 1965-06-06, 48 y.o.   MRN: [id number]  HPI Comments: "I have these little calluses on my toes"  Patient c/o callused areas 2nd and 4th toes bilateral for several months. She is an active runner. She would like to know how to prevent this from occuring. Sometimes they are tender. She has been using moleskin for padding.  Also, patient injured her plantar fascia left foot about 3-4 weeks ago. She has been seeing Dr. Oneida Alar. They did an ultrasound. She said it has improved, but not quite right yet. She is wearing some new orthotics that she just got today, so not sure how they will work yet.     Review of Systems  All other systems reviewed and are negative.      Objective:   Physical Exam        Assessment & Plan:

## 2013-08-31 NOTE — Assessment & Plan Note (Signed)
Support the left plantar fascia and prevent recurrent injury I suggested using orthotics at least for the next year that provide good longitudinal arch support  Patient was fitted for a : standard, cushioned, semi-rigid orthotic. The orthotic was heated and afterward the patient stood on the orthotic blank positioned on the orthotic stand. The patient was positioned in subtalar neutral position and 10 degrees of ankle dorsiflexion in a weight bearing stance. After completion of molding, a stable base was applied to the orthotic blank. The blank was ground to a stable position for weight bearing. Size:Red EVA size 9 Posting: blue EVA med dens base Additional orthotic padding: none  Preparation time: 40 mins

## 2013-08-31 NOTE — Progress Notes (Signed)
Subjective:     Patient ID: Joanne Mccarty, female   DOB: 07-27-1965, 48 y.o.   MRN: [id number]  HPI patient states that it is very painful corns between my toes and I tore my plantar fascial my left foot and now the outside of the foot hurts even though I think it hurt before I tore my plantar fascia. 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      Objective:   Physical Exam  Nursing note and vitals reviewed. Constitutional: She is oriented to person, place, and time.  Cardiovascular: Intact distal pulses.   Musculoskeletal: Normal range of motion.  Neurological: She is oriented to person, place, and time.  Skin: Skin is warm.   neurovascular status intact with muscle strength adequate in range of motion within normal limits. Patient is found to have what appears to be disruption of medial fibers left plantar fascia and discomfort at the insertion of the peroneal tendon into the base of the fifth metatarsal left. Patient has inflammation and pain between the third and fourth toes on the right foot and the second toe right foot and mild on the left toes     Assessment:     Inflammatory keratotic lesions right foot and tendinitis left peroneal tendon along with torn plantar fascia    Plan:     H&P and x-rays reviewed and today I did careful injection surrounding inflamed interphalangeal joints right and on the left I injected the sheath of the tendon at the insertion of peroneal tendon. Dispense past reduce pressure and reappoint 2 weeks to reevaluate

## 2013-08-31 NOTE — Progress Notes (Signed)
Patient ID: Joanne Mccarty, female   DOB: 1965/05/14, 48 y.o.   MRN: [id number]  Patient returns for followup of a plantar fascia rupture of the left foot  She has gradually but steadily increased her activity  Able to run up to 3 miles  Still gets some arch pain with tennis  We have kept her in sports insoles but it appears we need to get her in more support  She comes today for custom orthotics   Examination No acute distress BP 108/63  Ht 5\' 6"  (1.676 m)  Wt 129 lb (58.514 kg)  BMI 20.83 kg/m2  LMP 08/13/2011  Neutral arch The left does not show any swelling today Very mild tenderness over the insertion of the plantar/ She does not appear to have lost much arch height from the partial rupture  She has moderate supination on walking and running gait

## 2013-09-14 ENCOUNTER — Ambulatory Visit: Payer: PRIVATE HEALTH INSURANCE | Admitting: Podiatry

## 2013-11-17 ENCOUNTER — Telehealth: Payer: Self-pay | Admitting: Internal Medicine

## 2013-11-17 NOTE — Telephone Encounter (Signed)
Spoke to [employer].  She complains of being hot, shaky and sometimes disoriented.  States this has happened in the past (10 years) and was believed to be her blood sugar.  No official tests have been performed.  Would like an appt to discuss with [facility] and have testing.  Transferred to the front to make an appt.

## 2013-11-17 NOTE — Telephone Encounter (Signed)
Pt was transferred to [facility] and hung up before transferred was completed. Pt is experiencing low blood sugar and indigestion. Pt can not come in Monday. Please advise

## 2013-11-20 ENCOUNTER — Encounter: Payer: Self-pay | Admitting: Internal Medicine

## 2013-11-20 ENCOUNTER — Encounter: Payer: PRIVATE HEALTH INSURANCE | Admitting: Internal Medicine

## 2013-11-20 NOTE — Progress Notes (Signed)
Document opened and reviewed for OV but appt  canceled same day .  

## 2014-01-23 ENCOUNTER — Ambulatory Visit (INDEPENDENT_AMBULATORY_CARE_PROVIDER_SITE_OTHER): Payer: PRIVATE HEALTH INSURANCE | Admitting: Internal Medicine

## 2014-01-23 ENCOUNTER — Encounter: Payer: Self-pay | Admitting: Internal Medicine

## 2014-01-23 VITALS — BP 110/74 | Temp 98.1°F | Ht 65.5 in | Wt 130.5 lb

## 2014-01-23 DIAGNOSIS — J45990 Exercise induced bronchospasm: Secondary | ICD-10-CM

## 2014-01-23 DIAGNOSIS — R911 Solitary pulmonary nodule: Secondary | ICD-10-CM

## 2014-01-23 DIAGNOSIS — Z23 Encounter for immunization: Secondary | ICD-10-CM

## 2014-01-23 DIAGNOSIS — R3915 Urgency of urination: Secondary | ICD-10-CM

## 2014-01-23 DIAGNOSIS — G4700 Insomnia, unspecified: Secondary | ICD-10-CM

## 2014-01-23 DIAGNOSIS — R350 Frequency of micturition: Secondary | ICD-10-CM

## 2014-01-23 DIAGNOSIS — E039 Hypothyroidism, unspecified: Secondary | ICD-10-CM

## 2014-01-23 LAB — POCT URINALYSIS DIPSTICK
Bilirubin, UA: NEGATIVE
Blood, UA: NEGATIVE
Glucose, UA: NEGATIVE
KETONES UA: NEGATIVE
Leukocytes, UA: NEGATIVE
Nitrite, UA: NEGATIVE
PH UA: 6.5
PROTEIN UA: NEGATIVE
UROBILINOGEN UA: 0.2

## 2014-01-23 MED ORDER — SYNTHROID 88 MCG PO TABS: 88.0000 ug | ORAL_TABLET | Freq: Every day | ORAL | Status: DC

## 2014-01-23 MED ORDER — ZOLPIDEM TARTRATE 5 MG PO TABS: 5.0000 mg | ORAL_TABLET | Freq: Every evening | ORAL | Status: DC | PRN

## 2014-01-23 NOTE — Patient Instructions (Signed)
Get [place] copy of labs . Consider seeing surgeon about bleeding .  Will contact about  pulmonary nodule follow up.

## 2014-01-23 NOTE — Progress Notes (Signed)
Pre visit review using our clinic review tool, if applicable. No additional management support is needed unless otherwise documented below in the visit note.  Chief Complaint  Patient presents with  . Follow-up    HPI: Joanne Mccarty medication : Ruptured plantar fascia.   Tennis.   Rectal bleeding when running   And colon ok  ? Next step has seen dr Olevia Perches for this  Thyroid : had labs done dr rivard cause of fatigue was ok in august 1or 2 range and wasn't anemic. EIA uses inhaler occasionally. Sleep wakens and reads  often ocass takes med [place] 1/2  ROS: See pertinent positives and negatives per HPI. No cp sob change bowel habits  Mild dysuria comes and goes  Hx of incidental pulm nodules  Due for last follow up in December  LIFESTYLE:  Exercise:   Runs  Tobacco/ETS:no Alcohol: per day  2  Sugar beverages:  Soda diet  Sleep:  5-6 Drug use: no   Bone density:  Colonoscopy:  Last year   Past Medical History  Diagnosis Date  . History of chickenpox   . Abdominal bruit     fem neg doppler exam  . Hypothyroidism   . Anemia   . History of hypertension 1998    resolved  . Asthma     exercise induced-weather  . PONV (postoperative nausea and vomiting)   . Hx of pyelonephritis 2013    after surgery culture negative   . Rectal pressure 06/04/2011    while running   . Metatarsal stress fracture 07/22/2011    Based on scan and exam 80% likelihood of stress fracture but appears early   . Galactorrhea 06/21/2010  . Internal hemorrhoids     Family History  Problem Relation Age of Onset  . Hypertension Mother   . Melanoma Mother   . Hyperlipidemia Father   . Crohn's disease Brother   . Hypothyroidism Son     2011  . Diabetes Maternal Grandfather   . Colon cancer Neg Hx   . Diabetes Maternal Uncle     History   Social History  . Marital Status: Married    Spouse Name: N/A    Number of Children: 3  . Years of Education: N/A   Occupational History  .     Social  History Main Topics  . Smoking status: Former Smoker -- 5 years    Types: Cigarettes    Quit date: 09/03/1987  . Smokeless tobacco: Never Used  . Alcohol Use: 8.4 oz/week    14 Glasses of wine per week  . Drug Use: No  . Sexual Activity: Not Currently    Birth Control/ Protection: Surgical     Comment: VAS AND HYST   Other Topics Concern  . 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   G3P3   No tob   2 etoh    2 caffiene per day    Outpatient Encounter Prescriptions as of 01/23/2014  Medication Sig  . acetaminophen (TYLENOL) 500 MG tablet Take 1,000 mg by mouth every 6 (six) hours as needed. For pain, fever  . albuterol (PROVENTIL HFA;VENTOLIN HFA) 108 (90 BASE) MCG/ACT inhaler Inhale 1-2 puffs into the lungs every 6 (six) hours as needed. For exercise induced asthma symptoms.  . hyoscyamine (  LEVSIN/SL) 0.125 MG SL tablet Dissolve 1 tablet under the tongue after running as needed for abdominal cramping  . ibuprofen (ADVIL,MOTRIN) 400 MG tablet Take 400 mg by mouth every 6 (six) hours as needed. For pain  . SYNTHROID 88 MCG tablet Take 1 tablet (88 mcg total) by mouth daily.  Marland Kitchen zolpidem (AMBIEN) 5 MG tablet Take 1 tablet (5 mg total) by mouth at bedtime as needed for sleep.  . [DISCONTINUED] SYNTHROID 88 MCG tablet Take 1 tablet (88 mcg total) by mouth daily.  . [DISCONTINUED] zolpidem (AMBIEN) 5 MG tablet Take 1 tablet (5 mg total) by mouth at bedtime as needed for sleep.  . [DISCONTINUED] hydrocortisone (ANUSOL-HC) 25 MG suppository Insert rectally nightly.    EXAM:  BP 110/74  Temp(Src) 98.1 F (36.7 C) (Oral)  Ht 5' 5.5" (1.664 m)  Wt 130 lb 8 oz (59.194 kg)  BMI 21.38 kg/m2  LMP 08/13/2011  Body mass index is 21.38 kg/(m^2).  GENERAL: vitals reviewed and listed above, alert, oriented, appears well hydrated and in no  acute distress HEENT: atraumatic, conjunctiva  clear, no obvious abnormalities on inspection of external nose and ears OP : no lesion edema or exudate  NECK: no obvious masses on inspection palpation  LUNGS: clear to auscultation bilaterally, no wheezes, rales or rhonchi, good air movement CV: HRRR, no clubbing cyanosis or  peripheral edema nl cap refill  Abdomen:  Sof,t normal bowel sounds without hepatosplenomegaly, no guarding rebound or masses no CVA tenderness MS: moves all extremities without noticeable focal  abnormality PSYCH: pleasant and cooperative, no obvious depression or anxiety Lab Results  Component Value Date   WBC 5.4 01/16/2013   HGB 13.0 01/16/2013   HCT 38.8 01/16/2013   PLT 251.0 01/16/2013   GLUCOSE 82 01/16/2013   CHOL 193 01/16/2013   TRIG 47.0 01/16/2013   HDL 88.80 01/16/2013   LDLCALC 95 01/16/2013   ALT 21 01/16/2013   AST 26 01/16/2013   NA 138 01/16/2013   K 4.2 01/16/2013   CL 103 01/16/2013   CREATININE 0.8 01/16/2013   BUN 19 01/16/2013   CO2 29 01/16/2013   TSH 2.05 01/16/2013    ASSESSMENT AND PLAN:  Discussed the following assessment and plan:  Hypothyroidism, unspecified hypothyroidism type - reported in range  within last 2-3 months get [place] results refill med for one year   Urinary frequency - neg ua  - Plan: POC Urinalysis Dipstick  Urinary urgency - Plan: POC Urinalysis Dipstick  Incidental pulmonary nodule, > 70mm and < 19mm - Plan: CT Chest Wo Contrast  Exercise-induced bronchospasm  Insomnia - disc ocass ambien risk benefit aware   Need for prophylactic vaccination and inoculation against influenza - Plan: Flu Vaccine QUAD 36+ mos PF IM (Fluarix Quad PF)  -Patient advised to return or notify health care team  if symptoms worsen ,persist or new concerns arise.  Patient Instructions  Get [place] copy of labs . Consider seeing surgeon about bleeding .  Will contact about  pulmonary nodule follow up.    Joanne Mccarty. Joanne Mccarty  M.D.

## 2014-01-29 ENCOUNTER — Encounter: Payer: Self-pay | Admitting: Internal Medicine

## 2014-01-29 ENCOUNTER — Inpatient Hospital Stay: Admission: RE | Admit: 2014-01-29 | Payer: PRIVATE HEALTH INSURANCE | Source: Ambulatory Visit

## 2014-02-15 ENCOUNTER — Other Ambulatory Visit: Payer: Self-pay | Admitting: Obstetrics and Gynecology

## 2014-02-15 DIAGNOSIS — N63 Unspecified lump in unspecified breast: Secondary | ICD-10-CM

## 2014-02-28 ENCOUNTER — Ambulatory Visit (INDEPENDENT_AMBULATORY_CARE_PROVIDER_SITE_OTHER)
Admission: RE | Admit: 2014-02-28 | Discharge: 2014-02-28 | Disposition: A | Discharge status: Home / Self Care | Payer: PRIVATE HEALTH INSURANCE | Source: Ambulatory Visit | Attending: Internal Medicine | Admitting: Internal Medicine

## 2014-02-28 DIAGNOSIS — R911 Solitary pulmonary nodule: Secondary | ICD-10-CM

## 2014-03-01 ENCOUNTER — Ambulatory Visit
Admission: RE | Admit: 2014-03-01 | Discharge: 2014-03-01 | Disposition: A | Discharge status: Home / Self Care | Payer: PRIVATE HEALTH INSURANCE | Source: Ambulatory Visit | Attending: Obstetrics and Gynecology | Admitting: Obstetrics and Gynecology

## 2014-03-01 DIAGNOSIS — N63 Unspecified lump in unspecified breast: Secondary | ICD-10-CM

## 2014-04-27 ENCOUNTER — Other Ambulatory Visit: Payer: Self-pay | Admitting: Obstetrics and Gynecology

## 2014-04-27 DIAGNOSIS — N63 Unspecified lump in unspecified breast: Secondary | ICD-10-CM

## 2014-05-16 ENCOUNTER — Encounter: Payer: Self-pay | Admitting: Internal Medicine

## 2014-05-18 NOTE — Telephone Encounter (Signed)
Ok to do lab tsh  And fsh d dx hypothyroid fatigue ans sweats

## 2014-05-21 ENCOUNTER — Other Ambulatory Visit: Payer: Self-pay | Admitting: Family Medicine

## 2014-05-21 DIAGNOSIS — E038 Other specified hypothyroidism: Secondary | ICD-10-CM

## 2014-05-21 DIAGNOSIS — R61 Generalized hyperhidrosis: Secondary | ICD-10-CM

## 2014-05-21 DIAGNOSIS — R5383 Other fatigue: Secondary | ICD-10-CM

## 2014-05-22 ENCOUNTER — Other Ambulatory Visit (INDEPENDENT_AMBULATORY_CARE_PROVIDER_SITE_OTHER): Payer: PRIVATE HEALTH INSURANCE

## 2014-05-22 DIAGNOSIS — E038 Other specified hypothyroidism: Secondary | ICD-10-CM

## 2014-05-22 DIAGNOSIS — R61 Generalized hyperhidrosis: Secondary | ICD-10-CM

## 2014-05-22 DIAGNOSIS — R5383 Other fatigue: Secondary | ICD-10-CM

## 2014-05-22 LAB — FOLLICLE STIMULATING HORMONE: FSH: 53.6 m[IU]/mL

## 2014-05-22 LAB — TSH: TSH: 3.29 u[IU]/mL (ref 0.35–4.50)

## 2014-05-24 ENCOUNTER — Encounter: Payer: Self-pay | Admitting: Internal Medicine

## 2014-05-25 ENCOUNTER — Other Ambulatory Visit: Payer: Self-pay | Admitting: Family Medicine

## 2014-05-25 MED ORDER — SYNTHROID 100 MCG PO TABS: 100.0000 ug | ORAL_TABLET | Freq: Every day | ORAL | Status: DC

## 2014-05-25 NOTE — Telephone Encounter (Signed)
Ok to try high er dose and try not to overshoot  but  Its probably menopausal related . Increase to synthroid  167mcg 1 po qd disp 90  Recheck tsh in 2-3 months and ROV

## 2014-06-01 ENCOUNTER — Other Ambulatory Visit: Payer: Self-pay | Admitting: Obstetrics and Gynecology

## 2014-06-01 ENCOUNTER — Ambulatory Visit
Admission: RE | Admit: 2014-06-01 | Discharge: 2014-06-01 | Disposition: A | Discharge status: Home / Self Care | Payer: PRIVATE HEALTH INSURANCE | Source: Ambulatory Visit | Attending: Obstetrics and Gynecology | Admitting: Obstetrics and Gynecology

## 2014-06-01 DIAGNOSIS — N63 Unspecified lump in unspecified breast: Secondary | ICD-10-CM

## 2014-06-01 LAB — HM MAMMOGRAPHY

## 2014-06-05 ENCOUNTER — Ambulatory Visit
Admission: RE | Admit: 2014-06-05 | Discharge: 2014-06-05 | Disposition: A | Discharge status: Home / Self Care | Payer: PRIVATE HEALTH INSURANCE | Source: Ambulatory Visit | Attending: Obstetrics and Gynecology | Admitting: Obstetrics and Gynecology

## 2014-06-05 DIAGNOSIS — N63 Unspecified lump in unspecified breast: Secondary | ICD-10-CM

## 2014-07-04 ENCOUNTER — Ambulatory Visit (INDEPENDENT_AMBULATORY_CARE_PROVIDER_SITE_OTHER): Payer: PRIVATE HEALTH INSURANCE | Admitting: Sports Medicine

## 2014-07-04 ENCOUNTER — Encounter: Payer: Self-pay | Admitting: Sports Medicine

## 2014-07-04 VITALS — BP 110/51 | HR 52 | Ht 66.0 in | Wt 129.0 lb

## 2014-07-04 DIAGNOSIS — G5701 Lesion of sciatic nerve, right lower limb: Secondary | ICD-10-CM | POA: Diagnosis not present

## 2014-07-04 NOTE — Assessment & Plan Note (Signed)
I believe this may have been triggered by pressure from the seat position while cycling  Ultrasound is not remarkable  exam localizes to the piriformis or quadratus femoris  Plan to do some baseline strengthening exercises that involve hip motion  We found at least 2 stretches that caused significant pain over piriformis  Do stretches and home exercises daily  Okay to run and play tennis as long as not too much pain or limping  Recheck 2 months

## 2014-07-04 NOTE — Progress Notes (Signed)
Patient ID: LASONDRA HODGKINS, female   DOB: 1965/07/04, 49 y.o.   MRN: [id number]  Pain over the right buttocks Started after she got a road bike and started riding in January She plays tennis and runs but does not remember a specific injury  Pain is worse with sitting Sometimes radiates down the lateral leg into the lateral calf Running does not increase the pain much until she is out 2-3 miles  She has adjusted her bike seat and it feels somewhat better but still seems to put pressure  Examination No acute distress, thin and muscular BP 110/51 mmHg  Pulse 52  Ht 5\' 6"  (1.676 m)  Wt 129 lb (58.514 kg)  BMI 20.83 kg/m2  LMP 08/13/2011  Right hip shows full range of motion Tenderness to palpation is mild at the insertion of the hamstrings to the initial tuberosity There is some direct tenderness over the piriformis and the quadratus femoris  Hamstring quadriceps and hip strength are all normal  ultrasound Sciatic nerve visualized and appears normal Hamstrings show minimal change and no bursal swelling No tear is visualized in the piriformis or quadratus

## 2014-08-02 ENCOUNTER — Other Ambulatory Visit (INDEPENDENT_AMBULATORY_CARE_PROVIDER_SITE_OTHER): Payer: PRIVATE HEALTH INSURANCE

## 2014-08-02 DIAGNOSIS — E039 Hypothyroidism, unspecified: Secondary | ICD-10-CM

## 2014-08-02 LAB — TSH: TSH: 1.75 u[IU]/mL (ref 0.35–4.50)

## 2014-08-08 ENCOUNTER — Ambulatory Visit (INDEPENDENT_AMBULATORY_CARE_PROVIDER_SITE_OTHER): Payer: PRIVATE HEALTH INSURANCE | Admitting: Internal Medicine

## 2014-08-08 ENCOUNTER — Encounter: Payer: Self-pay | Admitting: Internal Medicine

## 2014-08-08 VITALS — BP 126/68 | Temp 98.7°F | Ht 65.5 in | Wt 130.7 lb

## 2014-08-08 DIAGNOSIS — R5383 Other fatigue: Secondary | ICD-10-CM | POA: Diagnosis not present

## 2014-08-08 DIAGNOSIS — Z79899 Other long term (current) drug therapy: Secondary | ICD-10-CM

## 2014-08-08 DIAGNOSIS — R251 Tremor, unspecified: Secondary | ICD-10-CM

## 2014-08-08 DIAGNOSIS — E039 Hypothyroidism, unspecified: Secondary | ICD-10-CM | POA: Diagnosis not present

## 2014-08-08 NOTE — Patient Instructions (Signed)
Uncertain cause of the fatigue .   Plan glucose tolerance test.  And fasting labs as dicussed . Call [facility] to make appt and i will put  In orders.  Calendar sx for now .  Plan follow up after all labs back .

## 2014-08-08 NOTE — Progress Notes (Signed)
Pre visit review using our clinic review tool, if applicable. No additional management support is needed unless otherwise documented below in the visit note.  Chief Complaint  Patient presents with  . Follow-up    HPI: Joanne Mccarty 49 y.o.  Fu cahnge in synthroid dose  Has felt badly for a few month  .  Shaky  2 episodes like  hypoglycemia Once playing tennis and once in grocery store  Feels ok in the am .   No caffiene  ? hypoglycemia and tries to eat right limits simple carbs but no restrictive   2  etoh per day.   ? Bleeding  Asked after blood draw and  Dental work but no  Bruising bleeding  ROS: See pertinent positives and negatives per HPI.ocass hot flush has had fsh done at [facility] and high menopausal.  Able to run 7 miles without problem denies depression eating disorder exercise intolerance  Says always has blood in stool colonscopy 2013 as prev none in urne  Not really taking the ambien rare use   Past Medical History  Diagnosis Date  . History of chickenpox   . Abdominal bruit     fem neg doppler exam  . Hypothyroidism   . Anemia   . History of hypertension 1998    resolved  . Asthma     exercise induced-weather  . PONV (postoperative nausea and vomiting)   . Hx of pyelonephritis 2013    after surgery culture negative   . Rectal pressure 06/04/2011    while running   . Metatarsal stress fracture 07/22/2011    Based on scan and exam 80% likelihood of stress fracture but appears early   . Galactorrhea 06/21/2010  . Internal hemorrhoids     Family History  Problem Relation Age of Onset  . Hypertension Mother   . Melanoma Mother   . Hyperlipidemia Father   . Crohn's disease Brother   . Hypothyroidism Son     2011  . Diabetes Maternal Grandfather   . Colon cancer Neg Hx   . Diabetes Maternal Uncle     History   Social History  . Marital Status: Married    Spouse Name: N/A  . Number of Children: 3  . Years of Education: N/A   Occupational History  .      Social History Main Topics  . Smoking status: Former Smoker -- 5 years    Types: Cigarettes    Quit date: 09/03/1987  . Smokeless tobacco: Never Used  . Alcohol Use: 8.4 oz/week    14 Glasses of wine per week  . Drug Use: No  . Sexual Activity: Not Currently    Birth Control/ Protection: Surgical     Comment: VAS AND HYST   Other Topics Concern  . 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   G3P3   No tob   2 etoh    2 caffiene per day    Outpatient Prescriptions Prior to Visit  Medication Sig Dispense Refill  . acetaminophen (TYLENOL) 500 MG tablet Take 1,000 mg by mouth every 6 (six) hours as needed. For pain, fever    . albuterol (PROVENTIL HFA;VENTOLIN HFA) 108 (90 BASE) MCG/ACT inhaler Inhale 1-2 puffs into the lungs every 6 (six) hours as needed. For exercise induced asthma symptoms.    Marland Kitchen  hyoscyamine (LEVSIN/SL) 0.125 MG SL tablet Dissolve 1 tablet under the tongue after running as needed for abdominal cramping 30 tablet 0  . ibuprofen (ADVIL,MOTRIN) 400 MG tablet Take 400 mg by mouth every 6 (six) hours as needed. For pain    . SYNTHROID 100 MCG tablet Take 1 tablet (100 mcg total) by mouth daily before breakfast. 90 tablet 0  . zolpidem (AMBIEN) 5 MG tablet Take 1 tablet (5 mg total) by mouth at bedtime as needed for sleep. 30 tablet 0   No facility-administered medications prior to visit.     EXAM:  BP 126/68 mmHg  Temp(Src) 98.7 F (37.1 C) (Oral)  Ht 5' 5.5" (1.664 m)  Wt 130 lb 11.2 oz (59.285 kg)  BMI 21.41 kg/m2  LMP 08/13/2011  Body mass index is 21.41 kg/(m^2).  GENERAL: vitals reviewed and listed above, alert, oriented, appears well hydrated and in no acute distress HEENT: atraumatic, conjunctiva  clear, no obvious abnormalities on inspection of external nose and ears OP : no lesion edema or exudate  NECK:  no obvious masses on inspection palpation no adnepathy  LUNGS: clear to auscultation bilaterally, no wheezes, rales or rhonchi, good air movement CV: HRRR, no clubbing cyanosis or  peripheral edema nl cap refill  Abdomen:  Sof,t normal bowel sounds without hepatosplenomegaly, no guarding rebound or masses no CVA tenderness MS: moves all extremities without noticeable focal  abnormality PSYCH: pleasant and cooperative, no obvious depression or anxiety  BP Readings from Last 3 Encounters:  08/08/14 126/68  07/04/14 110/51  01/23/14 110/74   Lab Results  Component Value Date   TSH 1.75 08/02/2014   Wt Readings from Last 3 Encounters:  08/08/14 130 lb 11.2 oz (59.285 kg)  07/04/14 129 lb (58.514 kg)  01/23/14 130 lb 8 oz (59.194 kg)     ASSESSMENT AND PLAN:  Discussed the following assessment and plan:  Shakiness - Plan: Glucose tolerance, 3 hours, Insulin, fasting, CBC with Differential/Platelet, Hemoglobin A1c, Lipid panel, Comprehensive metabolic panel, IBC panel, Urinalysis with Reflex Microscopic  Hypothyroidism, unspecified hypothyroidism type - Plan: Glucose tolerance, 3 hours, Insulin, fasting, CBC with Differential/Platelet, Hemoglobin A1c, Lipid panel, Comprehensive metabolic panel, IBC panel, Urinalysis with Reflex Microscopic  Medication management - Plan: Glucose tolerance, 3 hours, Insulin, fasting, CBC with Differential/Platelet, Hemoglobin A1c, Lipid panel, Comprehensive metabolic panel, IBC panel, Urinalysis with Reflex Microscopic  Other fatigue - Plan: Glucose tolerance, 3 hours, Insulin, fasting, CBC with Differential/Platelet, Hemoglobin A1c, Lipid panel, Comprehensive metabolic panel, IBC panel, Urinalysis with Reflex Microscopic Advise stop etoh for 2 weeks track sx get lab tests as discussed  Then go from there. Exam reassuring she is menopausal but her hx of elevated fsh -Patient advised to return or notify health care team  if symptoms worsen ,persist or new  concerns arise.  Patient Instructions  Uncertain cause of the fatigue .   Plan glucose tolerance test.  And fasting labs as dicussed . Call [facility] to make appt and i will put  In orders.  Calendar sx for now .  Plan follow up after all labs back .    Standley Brooking. Shavona Gunderman M.D.

## 2014-08-16 ENCOUNTER — Other Ambulatory Visit (INDEPENDENT_AMBULATORY_CARE_PROVIDER_SITE_OTHER): Payer: PRIVATE HEALTH INSURANCE

## 2014-08-16 DIAGNOSIS — R5383 Other fatigue: Secondary | ICD-10-CM

## 2014-08-16 DIAGNOSIS — R251 Tremor, unspecified: Secondary | ICD-10-CM

## 2014-08-16 DIAGNOSIS — Z79899 Other long term (current) drug therapy: Secondary | ICD-10-CM | POA: Diagnosis not present

## 2014-08-16 DIAGNOSIS — E039 Hypothyroidism, unspecified: Secondary | ICD-10-CM

## 2014-08-16 LAB — CBC WITH DIFFERENTIAL/PLATELET
BASOS ABS: 0 10*3/uL (ref 0.0–0.1)
BASOS PCT: 0.7 % (ref 0.0–3.0)
EOS PCT: 5 % (ref 0.0–5.0)
Eosinophils Absolute: 0.2 10*3/uL (ref 0.0–0.7)
HEMATOCRIT: 38.5 % (ref 36.0–46.0)
Hemoglobin: 13 g/dL (ref 12.0–15.0)
LYMPHS PCT: 33 % (ref 12.0–46.0)
Lymphs Abs: 1.4 10*3/uL (ref 0.7–4.0)
MCHC: 33.9 g/dL (ref 30.0–36.0)
MCV: 91.4 fl (ref 78.0–100.0)
MONOS PCT: 7.5 % (ref 3.0–12.0)
Monocytes Absolute: 0.3 10*3/uL (ref 0.1–1.0)
NEUTROS ABS: 2.3 10*3/uL (ref 1.4–7.7)
Neutrophils Relative %: 53.8 % (ref 43.0–77.0)
Platelets: 265 10*3/uL (ref 150.0–400.0)
RBC: 4.21 Mil/uL (ref 3.87–5.11)
RDW: 13.6 % (ref 11.5–15.5)
WBC: 4.2 10*3/uL (ref 4.0–10.5)

## 2014-08-16 LAB — COMPREHENSIVE METABOLIC PANEL
ALT: 15 U/L (ref 0–35)
AST: 20 U/L (ref 0–37)
Albumin: 4.2 g/dL (ref 3.5–5.2)
Alkaline Phosphatase: 40 U/L (ref 39–117)
BILIRUBIN TOTAL: 0.8 mg/dL (ref 0.2–1.2)
BUN: 13 mg/dL (ref 6–23)
CO2: 29 meq/L (ref 19–32)
CREATININE: 0.95 mg/dL (ref 0.40–1.20)
Calcium: 9.5 mg/dL (ref 8.4–10.5)
Chloride: 102 mEq/L (ref 96–112)
GFR: 66.76 mL/min (ref >60.00)
Glucose, Bld: 90 mg/dL (ref 70–99)
Potassium: 4.3 mEq/L (ref 3.5–5.1)
Sodium: 138 mEq/L (ref 135–145)
Total Protein: 7.3 g/dL (ref 6.0–8.3)

## 2014-08-16 LAB — URINALYSIS, ROUTINE W REFLEX MICROSCOPIC
Bilirubin Urine: NEGATIVE
Hgb urine dipstick: NEGATIVE
Ketones, ur: NEGATIVE
LEUKOCYTES UA: NEGATIVE
NITRITE: NEGATIVE
RBC / HPF: NONE SEEN (ref 0–?)
Total Protein, Urine: NEGATIVE
Urine Glucose: NEGATIVE
Urobilinogen, UA: 0.2 (ref 0.0–1.0)
WBC UA: NONE SEEN (ref 0–?)
pH: 7.5 (ref 5.0–8.0)

## 2014-08-16 LAB — GLUCOSE TOLERANCE, 3 HOURS
GLUCOSE 3 HOUR GTT: 56 mg/dL
GLUCOSE, 2 HOUR: 82 mg/dL
Glucose, 1 Hour GTT: 150 mg/dL
Glucose, Fasting: 99 mg/dL (ref 70–99)

## 2014-08-16 LAB — IBC PANEL
IRON: 130 ug/dL (ref 42–145)
Saturation Ratios: 30 % (ref 20.0–50.0)
Transferrin: 310 mg/dL (ref 212.0–360.0)

## 2014-08-16 LAB — LIPID PANEL
CHOLESTEROL: 183 mg/dL (ref 0–200)
HDL: 75.3 mg/dL (ref >39.00)
LDL CALC: 98 mg/dL (ref 0–99)
NONHDL: 107.7
Total CHOL/HDL Ratio: 2
Triglycerides: 48 mg/dL (ref 0.0–149.0)
VLDL: 9.6 mg/dL (ref 0.0–40.0)

## 2014-08-16 LAB — HEMOGLOBIN A1C: Hgb A1c MFr Bld: 5.3 % (ref 4.6–6.5)

## 2014-08-17 LAB — INSULIN, FASTING: Insulin fasting, serum: 1.5 u[IU]/mL — ABNORMAL LOW (ref 2.0–19.6)

## 2014-08-22 ENCOUNTER — Telehealth: Payer: Self-pay | Admitting: Internal Medicine

## 2014-08-22 DIAGNOSIS — R5383 Other fatigue: Secondary | ICD-10-CM

## 2014-08-22 DIAGNOSIS — R251 Tremor, unspecified: Secondary | ICD-10-CM

## 2014-08-22 DIAGNOSIS — E038 Other specified hypothyroidism: Secondary | ICD-10-CM

## 2014-08-22 NOTE — Telephone Encounter (Signed)
Pt called about lab results. °

## 2014-08-22 NOTE — Telephone Encounter (Signed)
Pt notified WP out of the office.  She would like to proceed with endo referral.  Would like to see Dr. Cruzita Lederer.  Referral placed in the system.

## 2014-08-28 ENCOUNTER — Other Ambulatory Visit: Payer: Self-pay | Admitting: Internal Medicine

## 2014-08-30 NOTE — Telephone Encounter (Signed)
Sent to the pharmacy by e-scribe. 

## 2014-10-24 ENCOUNTER — Ambulatory Visit (INDEPENDENT_AMBULATORY_CARE_PROVIDER_SITE_OTHER): Payer: PRIVATE HEALTH INSURANCE | Admitting: Internal Medicine

## 2014-10-24 ENCOUNTER — Encounter: Payer: Self-pay | Admitting: Internal Medicine

## 2014-10-24 VITALS — BP 100/60 | HR 65 | Temp 98.1°F | Resp 12 | Ht 66.5 in | Wt 131.0 lb

## 2014-10-24 DIAGNOSIS — E162 Hypoglycemia, unspecified: Secondary | ICD-10-CM

## 2014-10-24 DIAGNOSIS — E161 Other hypoglycemia: Secondary | ICD-10-CM | POA: Insufficient documentation

## 2014-10-24 NOTE — Patient Instructions (Addendum)
Patient Instructions  Please review the following website - stay with the lower glycemic load foods: www.glycemicindex.com  Have 3 meals a day - may need to add 1-2 snacks.  Eat as much fruit and veggies as you can. Berries, pears, apples, peaches, apricots - are the best.  Do not drink liquids with a meal, separate them by at least 30 min.   Start the meal with protein and fat and end with carbs.  Carry a snack with you everywhere. Best - to contain ~15 g of carbs.  I will refer you to nutrition. Please schedule an appt with [name].  Please consider using foods with a low glycemic load - see table below [facility]). Glycemic index and glycemic load for 100+ foods Glycemic index and glycemic load offer information about how foods affect blood sugar and insulin. The lower a food's glycemic index or glycemic load, the less it affects blood sugar and insulin levels. Here is a list of the glycemic index and glycemic load for more than 100 common foods. FOOD Glycemic index (glucose = 100) Serving size (grams) Glycemic load per serving  BAKERY PRODUCTS AND BREADS     Banana cake, made with sugar 47 60 14  Banana cake, made without sugar 55 60 12  Sponge cake, plain 46 63 17  Vanilla cake made from packet mix with vanilla frosting (Betty Crocker) 42 111 24  Apple, made with sugar 44 60 13  Apple, made without sugar 48 60 9  Waffles, Aunt Jemima (Quaker Oats) 76 35 10  Bagel, white, frozen 72 70 25  Baguette, white, plain 95 30 15  Coarse barley bread, 75-80% kernels, average 34 30 7  Hamburger bun 61 30 9  Kaiser roll 73 30 12  Pumpernickel bread 56 30 7  50% cracked wheat kernel bread 58 30 12  White wheat flour bread 71 30 10  Wonder" bread, average 73 30 10  Whole wheat bread, average 71 30 9  100% Whole Grain bread (Natural Ovens) 51 30 7  Pita bread, white 68 30 10  Corn tortilla 52 50 12  Wheat tortilla 30 50 8  BEVERAGES     Coca Cola, average 63 250 mL 16   Fanta, orange soft drink 68 250 mL 23  Lucozade, original (sparkling glucose drink) 9510 250 mL 40  Apple juice, unsweetened, average 44 250 mL 30  Cranberry juice cocktail (Ocean Spray) 68 250 mL 24  Gatorade 78 250 mL 12  Orange juice, unsweetened 50 250 mL 12  Tomato juice, canned 38 250 mL 4  BREAKFAST CEREALS AND RELATED PRODUCTS     All-Bran, average 55 30 12  Coco Pops, average 77 30 20  Cornflakes, average 93 30 23  Cream of Wheat (Nabisco) 66 250 17  Cream of Wheat, Instant (Nabisco) 74 250 22  Grapenuts, average 75 30 16  Muesli, average 66 30 16  Oatmeal, average 55 250 13  Instant oatmeal, average 83 250 30  Puffed wheat, average 80 30 17  Raisin Bran (Kellogg's) 61 30 12  Special K (Kellogg's) 69 30 14  GRAINS     Pearled barley, average 28 150 12  Sweet corn on the cob, average 60 150 20  Couscous, average 65 150 9  Quinoa 53 150 13  White rice, average 89 150 43  Quick cooking white basmati 67 150 28  Brown rice, average 50 150 16  Converted, white rice (Uncle Ben's) 38 150 14  Whole wheat  kernels, average 30 50 11  Bulgur, average 48 150 12  COOKIES AND CRACKERS     Graham crackers 74 25 14  Vanilla wafers 77 25 14  Shortbread 64 25 10  Rice cakes, average 82 25 17  Rye crisps, average 64 25 11  Soda crackers 74 25 12  DAIRY PRODUCTS AND ALTERNATIVES     Ice cream, regular 57 50 6  Ice cream, premium 38 50 3  Milk, full fat 41 22mL 5  Milk, skim 32 250 mL 4  Reduced-fat yogurt with fruit, average 33 200 11  FRUITS     Apple, average 39 120 6  Banana, ripe 62 120 16  Dates, dried 42 60 18  Grapefruit 25 120 3  Grapes, average 59 120 11  Orange, average 40 120 4  Peach, average 42 120 5  Peach, canned in light syrup 40 120 5  Pear, average 38 120 4  Pear, canned in pear juice 43 120 5  Prunes, pitted 29 60 10  Raisins 64 60 28  Watermelon 72 120 4  BEANS AND NUTS     Baked beans, average 40 150 6  Blackeye peas, average 33 150 10   Black beans 30 150 7  Chickpeas, average 10 150 3  Chickpeas, canned in brine 38 150 9  Navy beans, average 31 150 9  Kidney beans, average 29 150 7  Lentils, average 29 150 5  Soy beans, average 15 150 1  Cashews, salted 27 50 3  Peanuts, average 7 50 0  PASTA and NOODLES     Fettucini, average 32 180 15  Macaroni, average 47 180 23  Macaroni and Cheese (Kraft) 64 180 32  Spaghetti, white, boiled, average 46 180 22  Spaghetti, white, boiled 20 min, average 58 180 26  Spaghetti, wholemeal, boiled, average 42 180 17  SNACK FOODS     Corn chips, plain, salted, average 42 50 11  Fruit Roll-Ups 99 30 24  M & M's, peanut 33 30 6  Microwave popcorn, plain, average 55 20 6  Potato chips, average 51 50 12  Pretzels, oven-baked 83 30 16  Snickers Bar 51 60 18  VEGETABLES     Green peas, average 51 80 4  Carrots, average 35 80 2  Parsnips 52 80 4  Baked russet potato, average 111 150 33  Boiled white potato, average 82 150 21  Instant mashed potato, average 87 150 17  Sweet potato, average 70 150 22  Yam, average 54 150 20  MISCELLANEOUS     Hummus (chickpea salad dip) 6 30 0  Chicken nuggets, frozen, reheated in microwave oven 5 min 46 100 7  Pizza, plain baked dough, served with parmesan cheese and tomato sauce 80 100 22  Pizza, Super Supreme (Pizza Hut) 36 100 9  Honey, average 61 25 12  The complete list of the glycemic index and glycemic load for more than 1,000 foods can be found in the article "International tables of glycemic index and glycemic load values: 2008" by Illa Level. Joanne Mccarty, and Joanne Mccarty in the December 2008 issue of Diabetes Care, Vol. 31, number 12, pages 2281-2283.

## 2014-10-24 NOTE — Progress Notes (Signed)
Patient ID: Joanne Mccarty, female   DOB: [birth year], 49 y.o.   MRN: [id number]   HPI  Joanne Mccarty is a 49 y.o.-year-old female, referred by her PCP, Dr. Regis Bill, for management of hypothyroidism and postprandial hypoglycemia.  Pt. has been dx with Hashimoto's hypothyroidism in 2008; is on Synthroid DAW 88 >> 100 mcg, taken: - fasting - with water - separated by >30 min from b'fast  - no calcium, iron, PPIs, multivitamins   I reviewed pt's thyroid tests: Lab Results  Component Value Date   TSH 1.75 08/02/2014   TSH 3.29 05/22/2014   TSH 2.05 01/16/2013   TSH 1.473 12/23/2011   TSH 1.45 01/09/2011   TSH 1.38 06/17/2010   TSH 1.57 01/13/2010   TSH 1.94 01/02/2009   TSH 0.73 07/13/2008   TSH 2.22 05/07/2008   FREET4 1.53 12/23/2011   FREET4 1.04 06/17/2010   FREET4 1.0 07/13/2008    Pt describes: - + fatigue - no weight gain - no cold intolerance; + hot flushes (some night sweats - she is menopausal) - no depression/anxiety - no constipation/diarrhea - no dry skin, but + itching - no hair loss - + HA  - frequent L sided  Pt denies feeling nodules in neck, hoarseness, dysphagia/odynophagia, SOB with lying down.  She has + FH of thyroid disorders in: Hashimoto's thyroiditis. No FH of thyroid cancer.  No h/o radiation tx to head or neck. No recent use of iodine supplements.  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 She did not check her sugars many times since then.  The episodes appear usually more than 2 hours after meals, and especially if she eats concentrated sweets with that meal. She noticed that she develops the symptoms after eating cereals in the morning, but not with a solid meal. Also, the symptoms can happen after lunch, as she usually eats a yogurt with fruit for this meal. She mentions that if she controls her diet, she does not have episodes. Lately, she feels that the symptoms him are more  accentuated, especially causing foggy brain and fatigue. She also has tremors and sweating.  Last HbA1c: Lab Results  Component Value Date   HGBA1C 5.3 08/16/2014   She had an OGTT by PCP: Component     Latest Ref Rng 08/16/2014  Glucose, Fasting     70 - 99 mg/dL 99  Glucose, GTT - 1 Hour      150  Glucose, 2 hour      82  Glucose, GTT - 3 Hour      56  Insulin fasting, serum     2.0 - 19.6 uIU/mL 1.5 (L)   No weight gain   No meds with hypoglycemia side effects on her med list. Drinks 2 alcoholic drinks a day.  Pt's meals are: - Breakfast: cereals + milk or boiled egg + English muffin  - Lunch: yogurt and fruit  - Dinner: meat + vegetables and starch or salad  - Snacks: maybe 1   Exercise: Running, bicycle, tennis She can run 7 miles w/o  developing hypogly.  - no CKD, last BUN/creatinine:  Lab Results  Component Value Date   BUN 13 08/16/2014   CREATININE 0.95 08/16/2014   She had a negative blood test for celiac ds - 05/22/2014. She was started on spironolactone by dermatology. No hypotension with this. She also has a history of anemia.  ROS: Constitutional: +  fatigue, heat intolerance, hot flashes, poor sleep, occasional nocturia  Eyes: no blurry vision, no xerophthalmia ENT: no sore throat, no nodules palpated in throat, no dysphagia/odynophagia, no hoarseness, + tinnitus Cardiovascular: no CP/SOB/palpitations/leg swelling Respiratory: no cough/SOB Gastrointestinal: no N/V/D/C Musculoskeletal: no muscle/joint aches Skin: no rashes, + easy bruising, + itching Neurological: no tremors/numbness/tingling/dizziness, + HA Psychiatric: no depression/anxiety + see HPI  Past Medical History  Diagnosis Date  . History of chickenpox   . Abdominal bruit     fem neg doppler exam  . Hypothyroidism   . Anemia   . History of hypertension 1998    resolved  . Asthma     exercise induced-weather  . PONV (postoperative nausea and vomiting)   . Hx of pyelonephritis  2013    after surgery culture negative   . Rectal pressure 06/04/2011    while running   . Metatarsal stress fracture 07/22/2011    Based on scan and exam 80% likelihood of stress fracture but appears early   . Galactorrhea 06/21/2010  . Internal hemorrhoids    Past Surgical History  Procedure Laterality Date  . Dysplastic lesion       x 3 4/08  . Endometrial ablation  april 2009  . Breast surgery      left bx  . Novasure ablation    . Dilation and curettage of uterus  12/2007  . Robotic cystectomy  02/2010  . Vaginal hysterectomy  09/08/2011    Procedure: HYSTERECTOMY VAGINAL;  Surgeon: Alwyn Pea, MD;  Location: [facility];  Service: Gynecology;  Laterality: N/A;  . Rectocele repair  09/08/2011    Procedure: POSTERIOR REPAIR (RECTOCELE);  Surgeon: Alwyn Pea, MD;  Location: [facility];  Service: Gynecology;;  . Colonoscopy  03/10/2012    Procedure: COLONOSCOPY;  Surgeon: Lafayette Dragon, MD;  Location: [facility];  Service: Endoscopy;  Laterality: N/A;  . Hemorrhoid banding  03/10/2012    Procedure: HEMORRHOID BANDING;  Surgeon: Lafayette Dragon, MD;  Location: [facility];  Service: Endoscopy;  Laterality: N/A;   History   Social History  . Marital Status: Married    Spouse Name: N/A  . Number of Children: 3  . Years of Education: N/A   Occupational History  .  nurse practitioner     Social History Main Topics  . Smoking status: Former Smoker -- 5 years    Types: Cigarettes    Quit date: 09/03/1987  . Smokeless tobacco: Never Used  . Alcohol Use: 8.4 oz/week    14 Glasses of wine per week  . Drug Use: No  . 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   G3P3   No tob   2 etoh    2 caffiene per day   Current Outpatient Prescriptions on  File Prior to Visit  Medication Sig Dispense Refill  . SYNTHROID 100 MCG tablet TAKE 1 TABLET BY MOUTH DAILY BEFORE BREAKFAST. 90 tablet 3  . acetaminophen (TYLENOL) 500 MG tablet Take 1,000 mg by mouth every 6 (six) hours as needed. For pain, fever    . albuterol (PROVENTIL HFA;VENTOLIN HFA) 108 (90 BASE) MCG/ACT inhaler Inhale 1-2 puffs into the lungs every 6 (six) hours as needed. For  exercise induced asthma symptoms.    . hyoscyamine (LEVSIN/SL) 0.125 MG SL tablet Dissolve 1 tablet under the tongue after running as needed for abdominal cramping (Patient not taking: Reported on 10/24/2014) 30 tablet 0  . ibuprofen (ADVIL,MOTRIN) 400 MG tablet Take 400 mg by mouth every 6 (six) hours as needed. For pain    . zolpidem (AMBIEN) 5 MG tablet Take 1 tablet (5 mg total) by mouth at bedtime as needed for sleep. (Patient not taking: Reported on 10/24/2014) 30 tablet 0   No current facility-administered medications on file prior to visit.   Allergies  Allergen Reactions  . Cephalexin     REACTION: rash   Family History  Problem Relation Age of Onset  . Hypertension Mother   . Melanoma Mother   . Hyperlipidemia Father   . Crohn's disease Brother   . Hypothyroidism Son     2011  . Diabetes Maternal Grandfather   . Colon cancer Neg Hx   . Diabetes Maternal Uncle    PE: BP 100/60 mmHg  Pulse 65  Temp(Src) 98.1 F (36.7 C) (Oral)  Resp 12  Ht 5' 6.5" (1.689 m)  Wt 131 lb (59.421 kg)  BMI 20.83 kg/m2  SpO2 97%  LMP 08/13/2011 Wt Readings from Last 3 Encounters:  10/24/14 131 lb (59.421 kg)  08/08/14 130 lb 11.2 oz (59.285 kg)  07/04/14 129 lb (58.514 kg)   Constitutional: normal weight, in NAD Eyes: PERRLA, EOMI, no exophthalmos ENT: moist mucous membranes, no thyromegaly, no cervical lymphadenopathy Cardiovascular: RRR, No MRG Respiratory: CTA B Gastrointestinal: abdomen soft, NT, ND, BS+ Musculoskeletal: no deformities, strength intact in all 4 Skin: moist, warm, no  rashes Neurological: no tremor with outstretched hands, DTR normal in all 4  ASSESSMENT: 1. Hypothyroidism  2. Postprandial hypoglycemia   PLAN:  1. Patient with long-standing hypothyroidism, on levothyroxine (Synthroid) therapy. She appears euthyroid. She does not appear to have a goiter, thyroid nodules, or neck compression symptoms - We discussed about correct intake of levothyroxine, fasting, with water, separated by at least 30 minutes from breakfast, and separated by more than 4 hours from calcium, iron, multivitamins, acid reflux medications (PPIs). She is taking it correctly. - reviewed recent TFTs >> normal - continue LT4 100 mcg daily -  I will see her back in 1 year  2. Postprandial hypoglycemia - Patient with history of reactive (postprandial) hypoglycemia x 20 years, with CBG levels out of proportion compared with her symptoms. Today in the office she was feeling lightheaded and not being able to think clearly >> felt her sugars are low >> CBG was 83. She usually has episodes of hypoglycemic sxs:  tremors, sweating, fatigue - >2h after meals, if she eats semisolid meals and also concentrated sweats. She noticed that the sxs can appear after b'fast if she eats cereals with that meal, but not if she eats eggs + muffin. She also may have spells after lunch - when she usually eats a yoghurt + frui. No episodes after dinner (meat + veggies + starch or salad) - I had a long discussion with the patient about what reactive hypoglycemia means, and the fact that in patients without diabetes, the CBG threshold of 70 does not apply. Therefore, a sugar in the 50-60s is perfectly normal, and it can be even lower than that especially in young women and athletes.  - We discussed about what the notion of "sugar crash" means and that it can be a normal reaction to a high glycemic  index food.  - In pts with insulin resistance, for example prediabetic pts, there can be a mismatch between the sugar  increase after a meal and pancreatic insulin production. Insulin is secreted in the circulation to late and in too large quantities >> hypoglycemia. However, this is not her case - she exercises regularly and keeps a healthy weight. - I reassured her that she is at no increased risk for DM based on the low CBGs - we discussed her OGTT results - I explained that this test cannot be used to dx hypoglycemia as it may be abnormal in many healthy people that do not have sxs of hypoglycemia. However, her insulin level obtained during the test was low, which is very reassuring - explained the Whipple triad and its role in defining hypoglycemia in nondiabetics - I recommended she gets a ReliOn glucometer and checks sugars when feeling poorly - we discussed at length about diet (including examples) and the importance of avoiding high Glycemic Index foods - given web link for reference.  - I advised her to continue to work with nutrition to improve her diet to avoid reactive hypoglycemia >> she accepts the referral - given instructions for hypoglycemia management "15-15 rule", but we discussed that, in her case, she would benefit from a sweet snack with protein and fat (she carries PB with her) - will see her prn for this pb   - time spent with the patient: 1 hour, of which >50% was spent in obtaining information about her symptoms, reviewing her previous labs, evaluations, and treatments, and counseling her about her conditions (please see the discussed topics above); she had a number of questions which I addressed.

## 2015-01-14 ENCOUNTER — Ambulatory Visit (INDEPENDENT_AMBULATORY_CARE_PROVIDER_SITE_OTHER): Payer: PRIVATE HEALTH INSURANCE | Admitting: Family Medicine

## 2015-01-14 DIAGNOSIS — Z23 Encounter for immunization: Secondary | ICD-10-CM

## 2015-03-05 ENCOUNTER — Encounter: Payer: Self-pay | Admitting: Family Medicine

## 2015-03-05 ENCOUNTER — Ambulatory Visit (INDEPENDENT_AMBULATORY_CARE_PROVIDER_SITE_OTHER): Payer: PRIVATE HEALTH INSURANCE | Admitting: Family Medicine

## 2015-03-05 VITALS — BP 100/68 | Ht 66.0 in | Wt 128.0 lb

## 2015-03-05 DIAGNOSIS — M84374A Stress fracture, right foot, initial encounter for fracture: Secondary | ICD-10-CM | POA: Diagnosis not present

## 2015-03-05 NOTE — Progress Notes (Signed)
  Joanne Mccarty - 49 y.o. female MRN [id number]  Date of birth: 1965-11-03  CC: Right forefoot pain.   SUBJECTIVE:   HPI  2 weeks of foot pain in active female:  - Hx of stress fracture in right foot several years ago (2013). 3rd metatarsal - Pain in foot for last 2 weeks. Stable, not worsening.  - No acute injury.  - Numbness in toes with activity.  - Began a few days after back to back long runs (9+9 mi), which is abnormal for her.  Mileage 20-25/weekly.  - No worsened pain with prolonged running. Just constant from beginning to end.   - Barefoot bothers her at home.  - Avid tennis player as well.  - mild swelling.   ROS:     10 point  RoS negative other than that listed in HPI.   HISTORY: Past Medical, Surgical, Social, and Family History Reviewed & Updated per EMR.    OBJECTIVE: LMP 08/13/2011  Physical Exam  Nad, calm Non-labored breathing  Rt foot exam: Dorsal swelling and loss of definition  TTP 4th MT distal shaft Some TTP in 3rd intertarsal space, but less Mildly antalgic gait.    MSK [place] Longitudinal view reveals hypoechoic change along 4th MT shaft distally Significant increase in fluid in 4th MTP Cortical irregularity present on 4th.  No increase doppler flow Trans view shows a cap sign on 4th MT   Findings c/w early stress fx  MEDICATIONS, LABS & OTHER ORDERS: Previous Medications   ACETAMINOPHEN (TYLENOL) 500 MG TABLET    Take 1,000 mg by mouth every 6 (six) hours as needed. For pain, fever   ALBUTEROL (PROVENTIL HFA;VENTOLIN HFA) 108 (90 BASE) MCG/ACT INHALER    Inhale 1-2 puffs into the lungs every 6 (six) hours as needed. For exercise induced asthma symptoms.   HYOSCYAMINE (LEVSIN/SL) 0.125 MG SL TABLET    Dissolve 1 tablet under the tongue after running as needed for abdominal cramping   IBUPROFEN (ADVIL,MOTRIN) 400 MG TABLET    Take 400 mg by mouth every 6 (six) hours as needed. For pain   SPIRONOLACTONE (ALDACTONE) 50 MG TABLET    Take 50 mg by mouth  daily.   SYNTHROID 100 MCG TABLET    TAKE 1 TABLET BY MOUTH DAILY BEFORE BREAKFAST.   ZOLPIDEM (AMBIEN) 5 MG TABLET    Take 1 tablet (5 mg total) by mouth at bedtime as needed for sleep.   Modified Medications   No medications on file   New Prescriptions   No medications on file   Discontinued Medications   No medications on file  No orders of the defined types were placed in this encounter.   ASSESSMENT & PLAN: See problem based charting & AVS for pt instructions.

## 2015-03-06 NOTE — Assessment & Plan Note (Signed)
2nd MT stress fracture, last on right 3rd 3 years ago.  Currently 4th.   - Will use arch strap, which she has - Also has boot.  Recommended consistent use.  - No barefoot or footwear with flexible sole.   - Bike and swim as tolerated.  - Most important thing is to avoid things which cause pain.  - Believes she has had vitD & Ca checked. Will check with OB/Gyn physician and let us know if she hasn't had that done in the recent past. Will try to get 800 IU/2000mg  of vitd/ca daily. - f/u 3-4 weeks, sooner if needed.

## 2015-03-31 HISTORY — PX: BREAST BIOPSY: SHX20

## 2015-05-01 ENCOUNTER — Other Ambulatory Visit: Payer: Self-pay

## 2015-05-01 DIAGNOSIS — Z1231 Encounter for screening mammogram for malignant neoplasm of breast: Secondary | ICD-10-CM

## 2015-06-06 ENCOUNTER — Encounter: Payer: Self-pay | Admitting: Adult Health

## 2015-06-06 ENCOUNTER — Ambulatory Visit (INDEPENDENT_AMBULATORY_CARE_PROVIDER_SITE_OTHER): Payer: PRIVATE HEALTH INSURANCE | Admitting: Adult Health

## 2015-06-06 ENCOUNTER — Ambulatory Visit: Payer: PRIVATE HEALTH INSURANCE

## 2015-06-06 ENCOUNTER — Telehealth: Payer: Self-pay | Admitting: Internal Medicine

## 2015-06-06 VITALS — BP 92/60 | Temp 98.7°F | Ht 66.0 in | Wt 131.1 lb

## 2015-06-06 DIAGNOSIS — R110 Nausea: Secondary | ICD-10-CM | POA: Diagnosis not present

## 2015-06-06 DIAGNOSIS — H5711 Ocular pain, right eye: Secondary | ICD-10-CM | POA: Diagnosis not present

## 2015-06-06 NOTE — Progress Notes (Signed)
Subjective:    Patient ID: Joanne Mccarty, female    DOB: 02-11-1966, 50 y.o.   MRN: [id number]  HPI  50 year old female who presents to the office today for right sided headache and right sided eye pain. This pain started on Monday and it comes and goes. Over the last two evenings that pain has been " unbearable" and is described as a " dull ache" Denies blurred vision, tearing, no photosensitivity, she does not feel as though she has a foreign body in her eye. The pain is " a little bit better" today.  Took Hydrocodone last night for pain relief that did not help.   She has also had constant nausea for the last month. She was seen by her GYN and a UA, CBC, and BMP was drawn, she reports that they are all normal. No cause of her nausea was found.  Review of Systems  Constitutional: Negative.   Eyes: Positive for pain. Negative for photophobia, discharge, redness, itching and visual disturbance.  Respiratory: Negative.   Cardiovascular: Negative.   Gastrointestinal: Positive for nausea. Negative for vomiting, diarrhea and constipation.  Skin: Negative.   Neurological: Positive for headaches.  Psychiatric/Behavioral: Negative.   All other systems reviewed and are negative.  Past Medical History  Diagnosis Date  . History of chickenpox   . Abdominal bruit     fem neg doppler exam  . Hypothyroidism   . Anemia   . History of hypertension 1998    resolved  . Asthma     exercise induced-weather  . PONV (postoperative nausea and vomiting)   . Hx of pyelonephritis 2013    after surgery culture negative   . Rectal pressure 06/04/2011    while running   . Metatarsal stress fracture 07/22/2011    Based on scan and exam 80% likelihood of stress fracture but appears early   . Galactorrhea 06/21/2010  . Internal hemorrhoids     Social History   Social History  . Marital Status: Married    Spouse Name: N/A  . Number of Children: 3  . Years of Education: N/A   Occupational History  .      Social History Main Topics  . Smoking status: Former Smoker -- 5 years    Types: Cigarettes    Quit date: 09/03/1987  . Smokeless tobacco: Never Used  . Alcohol Use: 8.4 oz/week    14 Glasses of wine per week  . Drug Use: No  . Sexual Activity: Not Currently    Birth Control/ Protection: Surgical     Comment: VAS AND HYST   Other Topics Concern  . 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   G3P3   No tob   2 etoh    2 caffiene per day    Past Surgical History  Procedure Laterality Date  . Dysplastic lesion       x 3 4/08  . Endometrial ablation  april 2009  . Breast surgery      left bx  . Novasure ablation    . Dilation and curettage of uterus  12/2007  . Robotic cystectomy  02/2010  . Vaginal hysterectomy  09/08/2011    Procedure: HYSTERECTOMY VAGINAL;  Surgeon: Alwyn Pea, MD;  Location: [facility];  Service: Gynecology;  Laterality: N/A;  . Rectocele repair  09/08/2011    Procedure: POSTERIOR REPAIR (RECTOCELE);  Surgeon: Alwyn Pea, MD;  Location: [facility];  Service: Gynecology;;  . Colonoscopy  03/10/2012    Procedure: COLONOSCOPY;  Surgeon: Lafayette Dragon, MD;  Location: [facility];  Service: Endoscopy;  Laterality: N/A;  . Hemorrhoid banding  03/10/2012    Procedure: HEMORRHOID BANDING;  Surgeon: Lafayette Dragon, MD;  Location: [facility];  Service: Endoscopy;  Laterality: N/A;    Family History  Problem Relation Age of Onset  . Hypertension Mother   . Melanoma Mother   . Hyperlipidemia Father   . Crohn's disease Brother   . Hypothyroidism Son     2011  . Diabetes Maternal Grandfather   . Colon cancer Neg Hx   . Diabetes Maternal Uncle     Allergies  Allergen Reactions  . Cephalexin     REACTION: rash    Current Outpatient Prescriptions on File Prior to Visit  Medication Sig Dispense  Refill  . acetaminophen (TYLENOL) 500 MG tablet Take 1,000 mg by mouth every 6 (six) hours as needed. For pain, fever    . albuterol (PROVENTIL HFA;VENTOLIN HFA) 108 (90 BASE) MCG/ACT inhaler Inhale 1-2 puffs into the lungs every 6 (six) hours as needed. For exercise induced asthma symptoms.    Marland Kitchen ibuprofen (ADVIL,MOTRIN) 400 MG tablet Take 400 mg by mouth every 6 (six) hours as needed. For pain    . SYNTHROID 100 MCG tablet TAKE 1 TABLET BY MOUTH DAILY BEFORE BREAKFAST. 90 tablet 3   No current facility-administered medications on file prior to visit.    BP 92/60 mmHg  Temp(Src) 98.7 F (37.1 C) (Oral)  Ht 5\' 6"  (1.676 m)  Wt 131 lb 1.6 oz (59.467 kg)  BMI 21.17 kg/m2  LMP 08/13/2011       Objective:   Physical Exam  Constitutional: She is oriented to person, place, and time. She appears well-developed and well-nourished. No distress.  HENT:  Head: Normocephalic and atraumatic.  Right Ear: External ear normal.  Left Ear: External ear normal.  Nose: Nose normal.  Mouth/Throat: Oropharynx is clear and moist. No oropharyngeal exudate.  Eyes: Conjunctivae, EOM and lids are normal. Pupils are equal, round, and reactive to light. Lids are everted and swept, no foreign bodies found. Right eye exhibits no discharge, no exudate and no hordeolum. No foreign body present in the right eye. Left eye exhibits no discharge, no exudate and no hordeolum. No foreign body present in the left eye. Right conjunctiva is not injected. Right conjunctiva has no hemorrhage. Left conjunctiva is not injected. Left conjunctiva has no hemorrhage. No scleral icterus. Right eye exhibits normal extraocular motion and no nystagmus. Left eye exhibits normal extraocular motion and no nystagmus.  Fundoscopic exam:      The right eye shows no exudate, no hemorrhage and no papilledema. The right eye shows red reflex.       The left eye shows no exudate and no papilledema. The left eye shows red reflex.  Cardiovascular:  Normal rate, regular rhythm, normal heart sounds and intact distal pulses.  Exam reveals no gallop and no friction rub.   No murmur heard. Pulmonary/Chest: Effort normal and breath sounds normal. No respiratory distress. She has no wheezes. She has no rales. She exhibits no tenderness.  Abdominal: Soft. Bowel sounds are normal. She exhibits no distension and no mass. There is no tenderness. There is no rebound and no guarding.  Neurological: She is  alert and oriented to person, place, and time.  Skin: Skin is warm and dry. No rash noted. She is not diaphoretic. No erythema. No pallor.  Psychiatric: She has a normal mood and affect. Her behavior is normal. Judgment and thought content normal.  Nursing note and vitals reviewed.     Assessment & Plan:  1. Eye pain, right - Going to treat as migraine. She will take one of her son's imitrex to see if that helps with the pain. She is to follow up if no improvement.   2. Nausea without vomiting - She does have bouts of acid indigestion. I am wondering if this nausea is possibly coming from a ulcer. I will have her try Omeprazole for two weeks.  - Follow up if no improvement.

## 2015-06-06 NOTE — Telephone Encounter (Signed)
PLEASE NOTE: All timestamps contained within this report are represented as [place] Standard Time. CONFIDENTIALTY NOTICE: This fax transmission is intended only for the addressee. It contains information that is legally privileged, confidential or otherwise protected from use or disclosure. If you are not the intended recipient, you are strictly prohibited from reviewing, disclosing, copying using or disseminating any of this information or taking any action in reliance on or regarding this information. If you have received this fax in error, please notify us immediately by telephone so that we can arrange for its return to [place]. Phone: [phone], Toll-Free: [phone], Fax: [phone] Page: 1 of 1 Call Id: [id number] [facility] [place] Patient Name: Joanne Mccarty DOB: May 21, 1965 Initial Comment Caller states she is having eye ball pain. Headache. Nurse Assessment Nurse: Marcelline Deist, RN, Lynda Date/Time (Eastern Time): 06/06/2015 9:44:04 AM Confirm and document reason for call. If symptomatic, describe symptoms. You must click the next button to save text entered. ---Caller states she is having right eye ball pain. Has a headache behind the eye. Symptoms since Monday. Had the same thing a week or so ago. Has tried Ibuprofen & a narcotic. Seems to be constant. Today, not as bad. It has been a 7 on pain scale, a dull ache. Her eye seems fine. Has had underlying nausea. Has the patient traveled out of the country within the last 30 days? ---Not Applicable Does the patient have any new or worsening symptoms? ---Yes Will a triage be completed? ---Yes Related visit to physician within the last 2 weeks? ---No Does the PT have any chronic conditions? (i.e. diabetes, asthma, etc.) ---Yes List chronic conditions. ---on Synthroid Is the patient pregnant or possibly pregnant? (Ask all females between the ages of  46-55) ---No Is this a behavioral health or substance abuse call? ---No Guidelines Guideline Title Affirmed Question Affirmed Notes Headache [1] MODERATE headache (e.g., interferes with normal activities) AND [2] present > 24 hours AND [3] unexplained (Exceptions: analgesics not tried, typical migraine, or headache part of viral illness) Final Disposition User See Physician within [place], RN, Lynda Comments No availability with patient's provider this morning, so scheduled to see NP this afternoon. Referrals REFERRED TO PCP OFFICE Disagree/Comply: Comply

## 2015-06-06 NOTE — Progress Notes (Signed)
Pre visit review using our clinic review tool, if applicable. No additional management support is needed unless otherwise documented below in the visit note. 

## 2015-06-06 NOTE — Telephone Encounter (Signed)
Noted  

## 2015-06-06 NOTE — Telephone Encounter (Signed)
FYI, pt seeing Joanne Mccarty today.

## 2015-06-14 LAB — HM MAMMOGRAPHY

## 2015-08-22 ENCOUNTER — Other Ambulatory Visit: Payer: Self-pay | Admitting: Obstetrics and Gynecology

## 2015-08-28 ENCOUNTER — Other Ambulatory Visit: Payer: Self-pay | Admitting: Internal Medicine

## 2015-08-29 ENCOUNTER — Telehealth: Payer: Self-pay | Admitting: *Deleted

## 2015-08-29 NOTE — Telephone Encounter (Signed)
Pt lab results in md folder

## 2015-08-29 NOTE — Telephone Encounter (Signed)
Patient needs a follow up appt, she has not been seen in over a year, needs lab appt too.    Please let her know that I have filled her thyroid medication for 30 days, she'll need to be seen for any further refills.  Thank you.

## 2015-08-29 NOTE — Telephone Encounter (Signed)
Pt had her tsh check at  Her gyn office. Pt will have them fax over her results

## 2015-09-03 ENCOUNTER — Other Ambulatory Visit: Payer: Self-pay | Admitting: General Practice

## 2015-09-03 MED ORDER — SYNTHROID 100 MCG PO TABS: ORAL_TABLET | ORAL | Status: DC

## 2015-09-06 ENCOUNTER — Encounter: Payer: Self-pay | Admitting: Family Medicine

## 2015-09-09 ENCOUNTER — Other Ambulatory Visit (HOSPITAL_COMMUNITY): Payer: Self-pay | Admitting: Obstetrics and Gynecology

## 2015-09-09 NOTE — H&P (Signed)
Joanne Mccarty is a 50 y.o.  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  The patient underwent a vaginal hysterectomy with posterior repair in 2013 with an  uneventful recovery.  For the past  6 months,  the patient,  who is an avid runner,  has experienced urinary incontinence with running and lifting.  She denies urinary urgency,  dysuria, hematuria or history of renal stones.  Her physical exam revealed a 3/4 cystocele and Urodynamic studies confirmed stress urinary incontinence with urethral hypermobility.  A review of both medical and surgical management options were given to the patient, however,   the patient has decided to proceed with an anterior colporrhaphy with placement of tension free vaginal tape.   Past Medical History  OB History: G: 3;  P: 3-0-0-3;  SVB 2001, 2003 and 2006  GYN History: menarche: 50 YO    Contracepton S/P Hysterctomy  The patient denies history of sexually transmitted disease.  Remote  history of abnormal PAP smear in 1998 that was repeated and has been normal since.  Last PAP smear: 2012  Medical History: Exercise Induced Asthma, Anemia, Right Foot Stress Fracture, Dysplastic Nevi x 3; Goiter, Raynaud's Syndrome, Pulmonary Nodule-benign and Hypothyroidism   Surgical History: 2016: Right  Breast Fibroadenoma Excision; 2013: Vaginal Hysterectomy with Posterior Repair;  2011:  Robot Assisted Ovarian Cystectomy-Dermoid; 2009: Hysteroscopy, Dilatation, Curettage, Resection of Endometrial Polyps and Novasure Ablation and 1989: Excision of Left Breast Fibroadenoma Denies problems with anesthesia or history of blood transfusions  Family History: Melanoma, Diabetes Mellitus, Thyroid Disease, Hypertension, Hyperlipidemia, Abdominal Cancer, Migraine and Crohn's Disease.  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 acetaminophen (TYLENOL) 500 MG tablet Take 1,000 mg by mouth every 6 (six) hours as needed. For pain, fever  . albuterol (PROVENTIL HFA;VENTOLIN HFA) 108 (90 BASE) MCG/ACT inhaler Inhale 1-2 puffs into the lungs every 6 (six) hours as needed. For exercise induced asthma symptoms.  Marland Kitchen ibuprofen (ADVIL,MOTRIN) 400 MG tablet Take 400 mg by mouth every 6 (six) hours as needed. For pain  . lidocaine (LIDODERM) 5 %   . SYNTHROID 100 MCG tablet TAKE 1 TABLET BY MOUTH DAILY BEFORE BREAKFAST.   No facility-administered encounter medications on file as of 09/09/2015.    Allergies  Allergen Reactions  . Cephalexin     REACTION: rash    ROS: Admits to reading glasses, inguinal bruits with negative work up, right eye pain with sinusitis, multiple nevi, and  post void dribbling  but denies headache, vision changes, nasal congestion, dysphagia, tinnitus, dizziness, hoarseness, cough,  chest pain, shortness of breath, nausea, vomiting, diarrhea,constipation,  urinary frequency, urgency  dysuria, hematuria, vaginitis symptoms, pelvic pain, swelling of joints,easy bruising,  myalgias, arthralgias, skin rashes, unexplained weight loss and except as is mentioned in the history of present illness, patient's review of systems is otherwise negative.   Physical Exam  Bp: 102/64   P: 64    R:  16   Temperature: 99.1 degrees F orally   Weight: 128 lbs.  Height: 5' 5.75"  BMI: 20.8  Neck: supple without masses or thyromegaly Lungs: clear to auscultation Heart: regular rate and rhythm Abdomen: soft, non-tender and no organomegaly Pelvic:EGBUS- wnl; vagina-with grade 3/4 cystocele; uterus/cervix-surgically absent; adnexae-no  tenderness or masses Extremities:  no clubbing, cyanosis or edema   Assesment:  Stress Urinary Incontinence             Symptomatic  Cystocele   Disposition:  A discussion was held with patient regarding the indication for her procedure(s) along with the risks, which include but are not limited to: reaction to anesthesia, damage to adjacent organs, infection, erosion of sling mesh, worsening of symptoms, urinary retention and excessive bleeding. The patient verbalized understanding of these risks and has consented to proceed with Anterior Colporrhaphy with Placement of Tension Free Vaginal Tape at [place] on September 30, 2015.  CSN# [id number]   Analea Muller J. Florene Glen, PA-C  for Dr. Dede Query. [place]

## 2015-09-12 NOTE — Patient Instructions (Addendum)
Your procedure is scheduled on: Wednesday, July 5  Enter through the [employer] [facility] at: 10:45am  Pick up the phone at the desk and dial [phone].  Call this number if you have problems the morning of surgery: [phone].  Remember: Do NOT eat food after midnight Tuesday, 7/4  Do NOT drink clear liquids after 8am Wednesday, day of surgery  Take these medicines the morning of surgery with a SIP OF WATER:  Synthroid  Do NOT wear jewelry (body piercing), metal hair clips/bobby pins, make-up, or nail polish. Do NOT wear lotions, powders, or perfumes.  You may wear deoderant. Do NOT shave for 48 hours prior to surgery. Do NOT bring valuables to the hospital. Contacts, dentures, or bridgework may not be worn into surgery.  Leave suitcase in car.  After surgery it may be brought to your room.  For patients admitted to the hospital, checkout time is 11:00 AM the day of discharge. Have a responsible adult drive you home and stay with you for 24 hours after your procedure

## 2015-09-16 ENCOUNTER — Encounter (HOSPITAL_COMMUNITY): Payer: Self-pay

## 2015-09-16 ENCOUNTER — Encounter (HOSPITAL_COMMUNITY)
Admission: RE | Admit: 2015-09-16 | Discharge: 2015-09-16 | Disposition: A | Discharge status: Home / Self Care | Payer: PRIVATE HEALTH INSURANCE | Source: Ambulatory Visit | Attending: Obstetrics and Gynecology | Admitting: Obstetrics and Gynecology

## 2015-09-16 DIAGNOSIS — Z01812 Encounter for preprocedural laboratory examination: Secondary | ICD-10-CM | POA: Insufficient documentation

## 2015-09-16 HISTORY — DX: Essential (primary) hypertension: I10

## 2015-09-16 LAB — CBC
HEMATOCRIT: 39.3 % (ref 36.0–46.0)
Hemoglobin: 13.3 g/dL (ref 12.0–15.0)
MCH: 32.8 pg (ref 26.0–34.0)
MCHC: 33.8 g/dL (ref 30.0–36.0)
MCV: 96.8 fL (ref 78.0–100.0)
PLATELETS: 256 10*3/uL (ref 150–400)
RBC: 4.06 MIL/uL (ref 3.87–5.11)
RDW: 12.7 % (ref 11.5–15.5)
WBC: 8.6 10*3/uL (ref 4.0–10.5)

## 2015-10-01 MED ORDER — DEXTROSE 5 % IV SOLN
INTRAVENOUS | Status: AC
  Administered 2015-10-02: 30 mL via INTRAVENOUS
  Filled 2015-10-01: qty 7.5

## 2015-10-02 ENCOUNTER — Observation Stay (HOSPITAL_COMMUNITY)
Admission: RE | Admit: 2015-10-02 | Discharge: 2015-10-03 | Disposition: A | Discharge status: Home / Self Care | Payer: PRIVATE HEALTH INSURANCE | Source: Ambulatory Visit | Attending: Obstetrics and Gynecology | Admitting: Obstetrics and Gynecology

## 2015-10-02 ENCOUNTER — Ambulatory Visit (HOSPITAL_COMMUNITY): Payer: PRIVATE HEALTH INSURANCE | Admitting: Anesthesiology

## 2015-10-02 ENCOUNTER — Other Ambulatory Visit (HOSPITAL_COMMUNITY): Payer: PRIVATE HEALTH INSURANCE

## 2015-10-02 ENCOUNTER — Encounter (HOSPITAL_COMMUNITY): Payer: Self-pay

## 2015-10-02 ENCOUNTER — Encounter (HOSPITAL_COMMUNITY)
Admission: RE | Disposition: A | Discharge status: Home / Self Care | Payer: Self-pay | Source: Ambulatory Visit | Attending: Obstetrics and Gynecology

## 2015-10-02 DIAGNOSIS — Z87891 Personal history of nicotine dependence: Secondary | ICD-10-CM | POA: Diagnosis not present

## 2015-10-02 DIAGNOSIS — N8110 Cystocele, unspecified: Secondary | ICD-10-CM | POA: Insufficient documentation

## 2015-10-02 DIAGNOSIS — J45909 Unspecified asthma, uncomplicated: Secondary | ICD-10-CM | POA: Diagnosis not present

## 2015-10-02 DIAGNOSIS — N393 Stress incontinence (female) (male): Principal | ICD-10-CM | POA: Insufficient documentation

## 2015-10-02 DIAGNOSIS — I10 Essential (primary) hypertension: Secondary | ICD-10-CM | POA: Insufficient documentation

## 2015-10-02 HISTORY — PX: ANTERIOR AND POSTERIOR REPAIR: SHX5121

## 2015-10-02 HISTORY — PX: CYSTOSCOPY: SHX5120

## 2015-10-02 HISTORY — PX: BLADDER SUSPENSION: SHX72

## 2015-10-02 LAB — CBC
HEMATOCRIT: 30.9 % — AB (ref 36.0–46.0)
Hemoglobin: 10.4 g/dL — ABNORMAL LOW (ref 12.0–15.0)
MCH: 32.3 pg (ref 26.0–34.0)
MCHC: 33.7 g/dL (ref 30.0–36.0)
MCV: 96 fL (ref 78.0–100.0)
Platelets: 250 10*3/uL (ref 150–400)
RBC: 3.22 MIL/uL — ABNORMAL LOW (ref 3.87–5.11)
RDW: 12.6 % (ref 11.5–15.5)
WBC: 11.7 10*3/uL — ABNORMAL HIGH (ref 4.0–10.5)

## 2015-10-02 SURGERY — ANTERIOR (CYSTOCELE) AND POSTERIOR REPAIR (RECTOCELE)
Anesthesia: General | Site: Vagina

## 2015-10-02 MED ORDER — VASOPRESSIN 20 UNIT/ML IV SOLN
INTRAVENOUS | Status: AC
  Filled 2015-10-02: qty 1

## 2015-10-02 MED ORDER — ONDANSETRON HCL 4 MG/2ML IJ SOLN
INTRAMUSCULAR | Status: DC | PRN
  Administered 2015-10-02: 4 mg via INTRAVENOUS

## 2015-10-02 MED ORDER — ONDANSETRON HCL 4 MG PO TABS: 4.0000 mg | ORAL_TABLET | Freq: Four times a day (QID) | ORAL | Status: DC | PRN

## 2015-10-02 MED ORDER — DEXAMETHASONE SODIUM PHOSPHATE 10 MG/ML IJ SOLN
INTRAMUSCULAR | Status: AC
  Filled 2015-10-02: qty 1

## 2015-10-02 MED ORDER — PROMETHAZINE HCL 25 MG/ML IJ SOLN: 6.2500 mg | INTRAMUSCULAR | Status: DC | PRN

## 2015-10-02 MED ORDER — PROPOFOL 10 MG/ML IV BOLUS
INTRAVENOUS | Status: AC
  Filled 2015-10-02: qty 20

## 2015-10-02 MED ORDER — MEPERIDINE HCL 25 MG/ML IJ SOLN
INTRAMUSCULAR | Status: AC
  Filled 2015-10-02: qty 1

## 2015-10-02 MED ORDER — SODIUM CHLORIDE 0.9 % IJ SOLN
INTRAMUSCULAR | Status: AC
  Filled 2015-10-02: qty 100

## 2015-10-02 MED ORDER — OXYCODONE-ACETAMINOPHEN 5-325 MG PO TABS
1.0000 | ORAL_TABLET | ORAL | Status: DC | PRN
  Administered 2015-10-02 – 2015-10-03 (×3): 1 via ORAL
  Administered 2015-10-03: 2 via ORAL
  Administered 2015-10-03: 1 via ORAL
  Filled 2015-10-02: qty 2
  Filled 2015-10-02 (×3): qty 1
  Filled 2015-10-02: qty 2

## 2015-10-02 MED ORDER — IBUPROFEN 600 MG PO TABS
600.0000 mg | ORAL_TABLET | Freq: Four times a day (QID) | ORAL | Status: DC | PRN
  Administered 2015-10-03: 600 mg via ORAL
  Filled 2015-10-02: qty 1

## 2015-10-02 MED ORDER — ESTRADIOL 1 MG PO TABS
1.0000 mg | ORAL_TABLET | Freq: Every day | ORAL | Status: DC
  Administered 2015-10-03: 1 mg via ORAL
  Filled 2015-10-02 (×3): qty 1

## 2015-10-02 MED ORDER — ROCURONIUM BROMIDE 100 MG/10ML IV SOLN
INTRAVENOUS | Status: DC | PRN
  Administered 2015-10-02: 50 mg via INTRAVENOUS

## 2015-10-02 MED ORDER — SUGAMMADEX SODIUM 200 MG/2ML IV SOLN
INTRAVENOUS | Status: DC | PRN
  Administered 2015-10-02: 118 mg via INTRAVENOUS

## 2015-10-02 MED ORDER — KETOROLAC TROMETHAMINE 30 MG/ML IJ SOLN
30.0000 mg | Freq: Four times a day (QID) | INTRAMUSCULAR | Status: DC | PRN
  Administered 2015-10-02 – 2015-10-03 (×3): 30 mg via INTRAVENOUS
  Filled 2015-10-02 (×3): qty 1

## 2015-10-02 MED ORDER — ESTRADIOL 0.1 MG/GM VA CREA
TOPICAL_CREAM | VAGINAL | Status: DC | PRN
  Administered 2015-10-02: 1 via VAGINAL

## 2015-10-02 MED ORDER — MIDAZOLAM HCL 2 MG/2ML IJ SOLN
INTRAMUSCULAR | Status: AC
  Filled 2015-10-02: qty 2

## 2015-10-02 MED ORDER — STERILE WATER FOR IRRIGATION IR SOLN
Status: DC | PRN
  Administered 2015-10-02: 1000 mL via INTRAVESICAL

## 2015-10-02 MED ORDER — ZOLPIDEM TARTRATE 5 MG PO TABS
5.0000 mg | ORAL_TABLET | Freq: Once | ORAL | Status: AC
  Administered 2015-10-02: 5 mg via ORAL
  Filled 2015-10-02: qty 1

## 2015-10-02 MED ORDER — SIMETHICONE 80 MG PO CHEW: 80.0000 mg | CHEWABLE_TABLET | Freq: Four times a day (QID) | ORAL | Status: DC | PRN

## 2015-10-02 MED ORDER — ALBUTEROL SULFATE HFA 108 (90 BASE) MCG/ACT IN AERS: 1.0000 | INHALATION_SPRAY | Freq: Four times a day (QID) | RESPIRATORY_TRACT | Status: DC | PRN

## 2015-10-02 MED ORDER — LIDOCAINE HCL (CARDIAC) 20 MG/ML IV SOLN
INTRAVENOUS | Status: DC | PRN
  Administered 2015-10-02: 100 mg via INTRAVENOUS

## 2015-10-02 MED ORDER — LIDOCAINE HCL (CARDIAC) 20 MG/ML IV SOLN
INTRAVENOUS | Status: AC
  Filled 2015-10-02: qty 5

## 2015-10-02 MED ORDER — PNEUMOCOCCAL VAC POLYVALENT 25 MCG/0.5ML IJ INJ
0.5000 mL | INJECTION | INTRAMUSCULAR | Status: DC
  Filled 2015-10-02: qty 0.5

## 2015-10-02 MED ORDER — PROPOFOL 10 MG/ML IV BOLUS
INTRAVENOUS | Status: DC | PRN
  Administered 2015-10-02: 200 mg via INTRAVENOUS

## 2015-10-02 MED ORDER — ONDANSETRON HCL 4 MG/2ML IJ SOLN
4.0000 mg | Freq: Four times a day (QID) | INTRAMUSCULAR | Status: DC | PRN
  Administered 2015-10-02: 4 mg via INTRAVENOUS
  Filled 2015-10-02: qty 2

## 2015-10-02 MED ORDER — MENTHOL 3 MG MT LOZG: 1.0000 | LOZENGE | OROMUCOSAL | Status: DC | PRN

## 2015-10-02 MED ORDER — FENTANYL CITRATE (PF) 100 MCG/2ML IJ SOLN
INTRAMUSCULAR | Status: DC | PRN
  Administered 2015-10-02: 150 ug via INTRAVENOUS
  Administered 2015-10-02 (×2): 50 ug via INTRAVENOUS

## 2015-10-02 MED ORDER — ACETAMINOPHEN 10 MG/ML IV SOLN
1000.0000 mg | Freq: Once | INTRAVENOUS | Status: AC
  Administered 2015-10-02: 1000 mg via INTRAVENOUS
  Filled 2015-10-02: qty 100

## 2015-10-02 MED ORDER — ONDANSETRON HCL 4 MG/2ML IJ SOLN
INTRAMUSCULAR | Status: AC
  Filled 2015-10-02: qty 2

## 2015-10-02 MED ORDER — LIDOCAINE-EPINEPHRINE (PF) 1 %-1:200000 IJ SOLN
INTRAMUSCULAR | Status: AC
  Filled 2015-10-02: qty 30

## 2015-10-02 MED ORDER — FENTANYL CITRATE (PF) 250 MCG/5ML IJ SOLN
INTRAMUSCULAR | Status: AC
  Filled 2015-10-02: qty 5

## 2015-10-02 MED ORDER — FENTANYL CITRATE (PF) 100 MCG/2ML IJ SOLN
25.0000 ug | INTRAMUSCULAR | Status: DC | PRN
  Administered 2015-10-02 (×2): 25 ug via INTRAVENOUS

## 2015-10-02 MED ORDER — MIDAZOLAM HCL 5 MG/5ML IJ SOLN
INTRAMUSCULAR | Status: DC | PRN
  Administered 2015-10-02: 2 mg via INTRAVENOUS

## 2015-10-02 MED ORDER — FENTANYL CITRATE (PF) 100 MCG/2ML IJ SOLN
25.0000 ug | INTRAMUSCULAR | Status: AC | PRN
  Administered 2015-10-02 (×6): 25 ug via INTRAVENOUS

## 2015-10-02 MED ORDER — LEVOTHYROXINE SODIUM 100 MCG PO TABS
100.0000 ug | ORAL_TABLET | Freq: Every day | ORAL | Status: DC
  Administered 2015-10-03: 100 ug via ORAL
  Filled 2015-10-02 (×2): qty 1

## 2015-10-02 MED ORDER — PHENAZOPYRIDINE HCL 100 MG PO TABS
100.0000 mg | ORAL_TABLET | Freq: Three times a day (TID) | ORAL | Status: DC
  Administered 2015-10-02 – 2015-10-03 (×4): 100 mg via ORAL
  Filled 2015-10-02 (×6): qty 1

## 2015-10-02 MED ORDER — DEXTROSE IN LACTATED RINGERS 5 % IV SOLN
INTRAVENOUS | Status: DC
  Administered 2015-10-02 – 2015-10-03 (×3): via INTRAVENOUS

## 2015-10-02 MED ORDER — FENTANYL CITRATE (PF) 100 MCG/2ML IJ SOLN
INTRAMUSCULAR | Status: AC
  Filled 2015-10-02: qty 2

## 2015-10-02 MED ORDER — SUGAMMADEX SODIUM 200 MG/2ML IV SOLN
INTRAVENOUS | Status: AC
  Filled 2015-10-02: qty 2

## 2015-10-02 MED ORDER — LACTATED RINGERS IV SOLN
INTRAVENOUS | Status: DC
  Administered 2015-10-02 (×3): via INTRAVENOUS

## 2015-10-02 MED ORDER — SCOPOLAMINE 1 MG/3DAYS TD PT72
1.0000 | MEDICATED_PATCH | Freq: Once | TRANSDERMAL | Status: DC
  Administered 2015-10-02: 1.5 mg via TRANSDERMAL

## 2015-10-02 MED ORDER — SODIUM CHLORIDE 0.9 % IV SOLN
INTRAVENOUS | Status: DC | PRN
  Administered 2015-10-02: 10 mL via INTRAMUSCULAR
  Administered 2015-10-02: 5 mL via INTRAMUSCULAR

## 2015-10-02 MED ORDER — ESTRADIOL 0.1 MG/GM VA CREA
TOPICAL_CREAM | VAGINAL | Status: AC
  Filled 2015-10-02: qty 42.5

## 2015-10-02 MED ORDER — MEPERIDINE HCL 25 MG/ML IJ SOLN
6.2500 mg | INTRAMUSCULAR | Status: DC | PRN
  Administered 2015-10-02: 6.25 mg via INTRAVENOUS

## 2015-10-02 MED ORDER — KETOROLAC TROMETHAMINE 30 MG/ML IJ SOLN
INTRAMUSCULAR | Status: AC
  Filled 2015-10-02: qty 1

## 2015-10-02 MED ORDER — SCOPOLAMINE 1 MG/3DAYS TD PT72
MEDICATED_PATCH | TRANSDERMAL | Status: AC
  Administered 2015-10-02: 1.5 mg via TRANSDERMAL
  Filled 2015-10-02: qty 1

## 2015-10-02 MED ORDER — DEXAMETHASONE SODIUM PHOSPHATE 10 MG/ML IJ SOLN
INTRAMUSCULAR | Status: DC | PRN
  Administered 2015-10-02: 10 mg via INTRAVENOUS

## 2015-10-02 SURGICAL SUPPLY — 37 items
BLADE SURG 11 STRL SS (BLADE) ×4 IMPLANT
BLADE SURG 15 STRL LF C SS BP (BLADE) ×2 IMPLANT
BLADE SURG 15 STRL SS (BLADE) ×4
CANISTER SUCT 3000ML (MISCELLANEOUS) ×4 IMPLANT
CATH FOLEY 2WAY SLVR  5CC 18FR (CATHETERS) ×4
CATH FOLEY 2WAY SLVR 5CC 18FR (CATHETERS) ×3 IMPLANT
CLOTH BEACON ORANGE TIMEOUT ST (SAFETY) ×8 IMPLANT
CONT PATH 16OZ SNAP LID 3702 (MISCELLANEOUS) IMPLANT
DECANTER SPIKE VIAL GLASS SM (MISCELLANEOUS) ×4 IMPLANT
DRSG COVADERM PLUS 2X2 (GAUZE/BANDAGES/DRESSINGS) IMPLANT
GAUZE PACKING 2X5 YD STRL (GAUZE/BANDAGES/DRESSINGS) ×4 IMPLANT
GLOVE BIO SURGEON STRL SZ7.5 (GLOVE) ×4 IMPLANT
GLOVE BIOGEL PI IND STRL 7.0 (GLOVE) ×6 IMPLANT
GLOVE BIOGEL PI IND STRL 7.5 (GLOVE) ×4 IMPLANT
GLOVE BIOGEL PI INDICATOR 7.0 (GLOVE) ×6
GLOVE BIOGEL PI INDICATOR 7.5 (GLOVE) ×4
GLOVE ECLIPSE 6.5 STRL STRAW (GLOVE) ×4 IMPLANT
GOWN STRL REUS W/TWL LRG LVL3 (GOWN DISPOSABLE) ×26 IMPLANT
LIQUID BAND (GAUZE/BANDAGES/DRESSINGS) ×4 IMPLANT
NEEDLE HYPO 22GX1.5 SAFETY (NEEDLE) ×4 IMPLANT
NS IRRIG 1000ML POUR BTL (IV SOLUTION) ×5 IMPLANT
PACK VAGINAL WOMENS (CUSTOM PROCEDURE TRAY) ×6 IMPLANT
SET CYSTO W/LG BORE CLAMP LF (SET/KITS/TRAYS/PACK) ×4 IMPLANT
SLING TRANS VAGINAL TAPE (Sling) ×2 IMPLANT
SLING UTERINE/ABD GYNECARE TVT (Sling) ×2 IMPLANT
SUT MNCRL AB 3-0 PS2 27 (SUTURE) IMPLANT
SUT MNCRL AB 4-0 PS2 18 (SUTURE) IMPLANT
SUT VIC AB 0 CT1 27 (SUTURE) ×8
SUT VIC AB 0 CT1 27XBRD ANBCTR (SUTURE) ×2 IMPLANT
SUT VIC AB 2-0 CT2 27 (SUTURE) ×8 IMPLANT
SUT VIC AB 2-0 SH 27 (SUTURE) ×16
SUT VIC AB 2-0 SH 27XBRD (SUTURE) ×16 IMPLANT
SUT VIC AB 3-0 SH 27 (SUTURE) ×8
SUT VIC AB 3-0 SH 27X BRD (SUTURE) ×4 IMPLANT
TOWEL OR 17X24 6PK STRL BLUE (TOWEL DISPOSABLE) ×16 IMPLANT
TRAY FOLEY CATH SILVER 14FR (SET/KITS/TRAYS/PACK) ×5 IMPLANT
WATER STERILE IRR 1000ML POUR (IV SOLUTION) ×4 IMPLANT

## 2015-10-02 NOTE — Op Note (Addendum)
Preop Diagnosis: SUI  Postop Diagnosis: SUI  Procedure:1.TVT 2. Cystoscopy  Fluids: 2000 cc  UOP: 300 cc  EBL: 25 cc  Complications:none  Procedure: A time out was performed after Dr. Cletis Media completed anterior repair at the start of the TVT/Cystoscopy.  The urine was noted to be blood tinged.  The patient was under general anesthesia and already prepped and draped in the normal sterile fashion in the dorsal lithotomy position. A weighted speculum was placed in the patient's vagina and the anterior vaginal wall was injected with dilute pitressin at a concentration of 20 units of pitressin in a total of 100cc of normal saline.  An incision was made in the anterior wall of the vagina for approximately 1cm beneath the midurethra and the underlying tissue was dissected away from the anterior vaginal wall down to the level of the lower symphysis pubis bilaterally. Attention was then turned to the mons pubis where two 5 mm incisions were made 2 fingerbreadths from the midline. The transabdominal guide was then passed through the mons pubis incision on the patient's right down through the space of Retzius and out through the anterior vaginal wall after deflecting the rigid urethral catheter guide to the ipsilateral side. The same was done on the contralateral side. Cystoscopy was performed and no invadvertant bladder injury was noted. The bladder was drained with a Foley while deflecting the rigid urethral catheter guide to the patient's right and the mesh was attached to the transabdominal guide and elevated up through the space of Retzius and out through the incision on the mons pubis on the ipsilateral side. The same was done on the contralateral side. Cystoscopy was performed again and no inadvertant bladder injury was noted. The 63 French Foley was left in the urethra and a large Claiborne Billings was placed between the urethra and the mesh in order to leave the mesh slack beneath the midurethra. The mesh was then  cut flush with the skin at the mons pubis incisions bilaterally. Cystoscopy was performed again and bilateral ureters were noted to efflux without difficulty. The bilateral incisions on the mons pubis were then cleaned and Liquaband applied. The anterior vaginal wall incision was repaired with 2-0 vicryl with interrupted stitches.  Vagina was packed with estrogen soaked vaginal packing.  Sponge, lap and needle count was correct.  The patient tolerated the procedure well and was returned to the recovery room in good condition.

## 2015-10-02 NOTE — Anesthesia Procedure Notes (Signed)
Procedure Name: Intubation Date/Time: 10/02/2015 12:17 PM Performed by: Riki Sheer Pre-anesthesia Checklist: Emergency Drugs available, Suction available, Patient being monitored, Patient identified and Timeout performed Patient Re-evaluated:Patient Re-evaluated prior to inductionOxygen Delivery Method: Circle system utilized Preoxygenation: Pre-oxygenation with 100% oxygen Intubation Type: IV induction Ventilation: Mask ventilation without difficulty Laryngoscope Size: Miller and 2 Grade View: Grade I Tube type: Oral Tube size: 7.0 mm Number of attempts: 1 Airway Equipment and Method: Stylet Placement Confirmation: ETT inserted through vocal cords under direct vision,  positive ETCO2,  CO2 detector and breath sounds checked- equal and bilateral Secured at: 21 cm Tube secured with: Tape Dental Injury: Teeth and Oropharynx as per pre-operative assessment

## 2015-10-02 NOTE — Anesthesia Preprocedure Evaluation (Addendum)
Anesthesia Evaluation  Patient identified by MRN, date of birth, ID band Patient awake    Reviewed: Allergy & Precautions, NPO status , Patient's Chart, lab work & pertinent test results  History of Anesthesia Complications (+) PONV and history of anesthetic complications  Airway Mallampati: I  TM Distance: >3 FB Neck ROM: Full    Dental  (+) Teeth Intact, Dental Advisory Given   Pulmonary asthma , former smoker,    Pulmonary exam normal breath sounds clear to auscultation       Cardiovascular Exercise Tolerance: Good hypertension, Normal cardiovascular exam Rhythm:Regular Rate:Normal     Neuro/Psych negative neurological ROS  negative psych ROS   GI/Hepatic negative GI ROS, Neg liver ROS,   Endo/Other  Hypothyroidism   Renal/GU negative Renal ROS   Cystocele, rectocele    Musculoskeletal negative musculoskeletal ROS (+)   Abdominal   Peds  Hematology negative hematology ROS (+)   Anesthesia Other Findings Day of surgery medications reviewed with the patient.  Reproductive/Obstetrics negative OB ROS                           Anesthesia Physical Anesthesia Plan  ASA: II  Anesthesia Plan: General   Post-op Pain Management:    Induction: Intravenous  Airway Management Planned: Oral ETT  Additional Equipment:   Intra-op Plan:   Post-operative Plan: Extubation in OR  Informed Consent: I have reviewed the patients History and Physical, chart, labs and discussed the procedure including the risks, benefits and alternatives for the proposed anesthesia with the patient or authorized representative who has indicated his/her understanding and acceptance.   Dental advisory given  Plan Discussed with: CRNA  Anesthesia Plan Comments: (Risks/benefits of general anesthesia discussed with patient including risk of damage to teeth, lips, gum, and tongue, nausea/vomiting, allergic reactions  to medications, and the possibility of heart attack, stroke and death.  All patient questions answered.  Patient wishes to proceed.)        Anesthesia Quick Evaluation

## 2015-10-02 NOTE — Op Note (Signed)
Preoperative diagnosis:cystocele with stress urinary incontinence  Postoperative diagnosis: Same   Anesthesia: General  Anesthesiologist: Gifford Shave  Procedure: Anterior repair Dr Cletis Media                      TVT  Dr Mancel Bale ( separate dictation)  Surgeon: Dr. Katharine Look Inda Mcglothen   Assistant: Earnstine Regal P.A.-C.   Estimated blood loss: minimal  Procedure:   After being informed of the planned procedure with possible complications including but not limited to infection, bleeding, injury to other organs, need for laparotomy, expected hospitals they and recovery time, informed consent was obtained and patient was taken to or 4.   She was given general anesthesia with endotracheal intubation without any complication. She was placed in the lithotomy position prepped and draped in a sterile fashion and a Foley catheter was inserted in her bladder.   A weighted speculum is inserted in the vagina and the vaginal apex was grasped with 2 Allis  forceps. We infiltrate the vaginal  mucosa  using 6 cc of Vasopressine 20 units/100 cc of saline.  The vaginal mucosa is then opened with knife medially and sharply dissected all the way to 1 cm below the urethrovesical junction. We proceed with sharp and blunt dissection of the prevesical fascia until the cystocele is completely freed. We then correct the cystocele with multilayered U- stitches of 2-0 Vicryl until complete resolution. Excess of vaginal mucosa is excised. The anterior vaginal mucosa is then closed using figure-of-eight stitches of 3-0 Vicryl.   Patient is then left to the care of Dr Mancel Bale who will proceed with placement of Tension Free Vaginal Tape for correction of urinary stress incontinence.   Instruments and sponge count is complete x2.   Estimated blood loss is minimal   Specimen: none

## 2015-10-02 NOTE — Interval H&P Note (Signed)
History and Physical Interval Note:  10/02/2015 11:37 AM  Joanne Mccarty  has presented today for surgery, with the diagnosis of Cystocele with Stress Urinary Incontinence  The various methods of treatment have been discussed with the patient and family. After consideration of risks, benefits and other options for treatment, the patient has consented to  Procedure(s): ANTERIOR (CYSTOCELE) AND POSTERIOR REPAIR (RECTOCELE) (N/A) TRANSVAGINAL TAPE (TVT) PROCEDURE (N/A) CYSTOSCOPY (N/A) as a surgical intervention .  The patient's history has been reviewed, patient examined, no change in status, stable for surgery.  I have reviewed the patient's chart and labs.  Questions were answered to the patient's satisfaction.     Norvin Ohlin A

## 2015-10-02 NOTE — Transfer of Care (Signed)
Immediate Anesthesia Transfer of Care Note  Patient: EZOLA GAJDA  Procedure(s) Performed: Procedure(s): ANTERIOR (CYSTOCELE) REPAIR (N/A) TRANSVAGINAL TAPE (TVT) PROCEDURE (N/A) CYSTOSCOPY (N/A)  Patient Location: PACU  Anesthesia Type:General  Level of Consciousness: awake, alert  and oriented  Airway & Oxygen Therapy: Patient Spontanous Breathing and Patient connected to nasal cannula oxygen  Post-op Assessment: Report given to RN, Post -op Vital signs reviewed and stable and Patient moving all extremities  Post vital signs: Reviewed and stable  Last Vitals:  Filed Vitals:   10/02/15 1050  BP: 114/77  Pulse: 59  Temp: 36.6 C  Resp: 16    Last Pain: There were no vitals filed for this visit.    Patients Stated Pain Goal: 3 ([id number])  Complications: No apparent anesthesia complications

## 2015-10-02 NOTE — Addendum Note (Signed)
Addendum  created 10/02/15 2107 by Flossie Dibble, CRNA   Modules edited: Notes Section   Notes Section:  File: [id number]

## 2015-10-02 NOTE — Anesthesia Postprocedure Evaluation (Signed)
Anesthesia Post Note  Patient: Joanne Mccarty  Procedure(s) Performed: Procedure(s) (LRB): ANTERIOR (CYSTOCELE) REPAIR (N/A) TRANSVAGINAL TAPE (TVT) PROCEDURE (N/A) CYSTOSCOPY (N/A)  Patient location during evaluation: [facility] Anesthesia Type: General Level of consciousness: awake and alert Pain management: satisfactory to patient Vital Signs Assessment: post-procedure vital signs reviewed and stable Respiratory status: spontaneous breathing and respiratory function stable Cardiovascular status: stable Postop Assessment: adequate PO intake Anesthetic complications: no     Last Vitals:  Filed Vitals:   10/02/15 1757 10/02/15 1906  BP: 81/42 87/53  Pulse: 49 64  Temp: 36.9 C   Resp: 16 16    Last Pain:  Filed Vitals:   10/02/15 1943  PainSc: 5    Pain Goal: Patients Stated Pain Goal: 4 (10/02/15 1700)               Timira Bieda

## 2015-10-02 NOTE — Anesthesia Postprocedure Evaluation (Signed)
Anesthesia Post Note  Patient: Joanne Mccarty  Procedure(s) Performed: Procedure(s) (LRB): ANTERIOR (CYSTOCELE) REPAIR (N/A) TRANSVAGINAL TAPE (TVT) PROCEDURE (N/A) CYSTOSCOPY (N/A)  Patient location during evaluation: PACU Anesthesia Type: General Level of consciousness: awake and alert Pain management: pain level controlled Vital Signs Assessment: post-procedure vital signs reviewed and stable Respiratory status: spontaneous breathing, nonlabored ventilation, respiratory function stable and patient connected to nasal cannula oxygen Cardiovascular status: blood pressure returned to baseline and stable Postop Assessment: no signs of nausea or vomiting Anesthetic complications: no     Last Vitals:  Filed Vitals:   10/02/15 1630 10/02/15 1645  BP: 95/61 98/55  Pulse: 51 59  Temp: 36.7 C 36.8 C  Resp: 11 14    Last Pain:  Filed Vitals:   10/02/15 1658  PainSc: 4    Pain Goal: Patients Stated Pain Goal: 3 (10/02/15 1530)               Catalina Gravel

## 2015-10-02 NOTE — H&P (View-Only) (Signed)
Joanne Mccarty is a 50 y.o.  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  The patient underwent a vaginal hysterectomy with posterior repair in 2013 with an  uneventful recovery.  For the past  6 months,  the patient,  who is an avid runner,  has experienced urinary incontinence with running and lifting.  She denies urinary urgency,  dysuria, hematuria or history of renal stones.  Her physical exam revealed a 3/4 cystocele and Urodynamic studies confirmed stress urinary incontinence with urethral hypermobility.  A review of both medical and surgical management options were given to the patient, however,   the patient has decided to proceed with an anterior colporrhaphy with placement of tension free vaginal tape.   Past Medical History  OB History: G: 3;  P: 3-0-0-3;  SVB 2001, 2003 and 2006  GYN History: menarche: 50 YO    Contracepton S/P Hysterctomy  The patient denies history of sexually transmitted disease.  Remote  history of abnormal PAP smear in 1998 that was repeated and has been normal since.  Last PAP smear: 2012  Medical History: Exercise Induced Asthma, Anemia, Right Foot Stress Fracture, Dysplastic Nevi x 3; Goiter, Raynaud's Syndrome, Pulmonary Nodule-benign and Hypothyroidism   Surgical History: 2016: Right  Breast Fibroadenoma Excision; 2013: Vaginal Hysterectomy with Posterior Repair;  2011:  Robot Assisted Ovarian Cystectomy-Dermoid; 2009: Hysteroscopy, Dilatation, Curettage, Resection of Endometrial Polyps and Novasure Ablation and 1989: Excision of Left Breast Fibroadenoma Denies problems with anesthesia or history of blood transfusions  Family History: Melanoma, Diabetes Mellitus, Thyroid Disease, Hypertension, Hyperlipidemia, Abdominal Cancer, Migraine and Crohn's Disease.  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 acetaminophen (TYLENOL) 500 MG tablet Take 1,000 mg by mouth every 6 (six) hours as needed. For pain, fever  . albuterol (PROVENTIL HFA;VENTOLIN HFA) 108 (90 BASE) MCG/ACT inhaler Inhale 1-2 puffs into the lungs every 6 (six) hours as needed. For exercise induced asthma symptoms.  Marland Kitchen ibuprofen (ADVIL,MOTRIN) 400 MG tablet Take 400 mg by mouth every 6 (six) hours as needed. For pain  . lidocaine (LIDODERM) 5 %   . SYNTHROID 100 MCG tablet TAKE 1 TABLET BY MOUTH DAILY BEFORE BREAKFAST.   No facility-administered encounter medications on file as of 09/09/2015.    Allergies  Allergen Reactions  . Cephalexin     REACTION: rash    ROS: Admits to reading glasses, inguinal bruits with negative work up, right eye pain with sinusitis, multiple nevi, and  post void dribbling  but denies headache, vision changes, nasal congestion, dysphagia, tinnitus, dizziness, hoarseness, cough,  chest pain, shortness of breath, nausea, vomiting, diarrhea,constipation,  urinary frequency, urgency  dysuria, hematuria, vaginitis symptoms, pelvic pain, swelling of joints,easy bruising,  myalgias, arthralgias, skin rashes, unexplained weight loss and except as is mentioned in the history of present illness, patient's review of systems is otherwise negative.   Physical Exam  Bp: 102/64   P: 64    R:  16   Temperature: 99.1 degrees F orally   Weight: 128 lbs.  Height: 5' 5.75"  BMI: 20.8  Neck: supple without masses or thyromegaly Lungs: clear to auscultation Heart: regular rate and rhythm Abdomen: soft, non-tender and no organomegaly Pelvic:EGBUS- wnl; vagina-with grade 3/4 cystocele; uterus/cervix-surgically absent; adnexae-no  tenderness or masses Extremities:  no clubbing, cyanosis or edema   Assesment:  Stress Urinary Incontinence             Symptomatic  Cystocele   Disposition:  A discussion was held with patient regarding the indication for her procedure(s) along with the risks, which include but are not limited to: reaction to anesthesia, damage to adjacent organs, infection, erosion of sling mesh, worsening of symptoms, urinary retention and excessive bleeding. The patient verbalized understanding of these risks and has consented to proceed with Anterior Colporrhaphy with Placement of Tension Free Vaginal Tape at [place] on September 30, 2015.  CSN# [id number]   Julie Nay J. Florene Glen, PA-C  for Dr. Dede Query. [place]

## 2015-10-03 DIAGNOSIS — N393 Stress incontinence (female) (male): Secondary | ICD-10-CM | POA: Diagnosis not present

## 2015-10-03 MED ORDER — OXYCODONE-ACETAMINOPHEN 5-325 MG PO TABS: 1.0000 | ORAL_TABLET | ORAL | Status: DC | PRN

## 2015-10-03 MED ORDER — PHENAZOPYRIDINE HCL 100 MG PO TABS: 100.0000 mg | ORAL_TABLET | Freq: Three times a day (TID) | ORAL | Status: DC

## 2015-10-03 MED ORDER — CIPROFLOXACIN HCL 500 MG PO TABS: 500.0000 mg | ORAL_TABLET | Freq: Two times a day (BID) | ORAL | Status: DC

## 2015-10-03 MED ORDER — KETOROLAC TROMETHAMINE 10 MG PO TABS: 10.0000 mg | ORAL_TABLET | Freq: Four times a day (QID) | ORAL | Status: DC | PRN

## 2015-10-03 NOTE — Discharge Instructions (Signed)
POST-OPERATIVE INSTRUCTIONS TO PATIENT  Call [facility]  [phone])  for excessive pain, bleeding or temperature greater than or equal to 100.4 degrees (orally).    No driving for 2 weeks No lifting (more than 20 lbs) for 6 weeks No sexual activity for 6 weeks  Pain management:  Use Ibuprofen 600 mg or Toradol 10 mg every 6 hours for 5 days and then as needed. Use your pain medication as needed to maintain a pain level at or below 3/10 Use Colace 1-2 capsules per day as long as you are using pain medication to avoid constipation.       Diet: normal  Bathing: may shower day after surgery  Wound Care: keep incisions clean and dry  Return to Dr. Cletis Media as scheduled      [id number] A MD 10/03/2015 1:28 PM

## 2015-10-03 NOTE — Progress Notes (Signed)
Pt teaching complete  Out in wheelchair   

## 2015-10-03 NOTE — Discharge Summary (Signed)
  Physician Discharge Summary  Patient ID: Joanne Mccarty MRN: [id number] DOB/AGE: 11/05/1965 50 y.o.  Admit date: 10/02/2015 Discharge date: 10/03/2015  Admission Diagnoses: Cystocele with Stress Urinary Incontinence  Discharge Diagnoses: Cystocele with Stress Urinary Incontinence      Discharged Condition: good  Hospital Course: Anterior repair by Dr Cletis Media and TVT/cystoscopy by Dr Mancel Bale on 10/02/15. Voided spontaneously x3 100-200 cc. Some feeling of incomplete emptying but no discomfort.                                 Physical Exam:   BP 92/48 mmHg  Pulse 58  Temp(Src) 98.4 F (36.9 C) (Oral)  Resp 18  SpO2 95%  LMP 08/13/2011 Constitutional: alert, in no distress Cardiovascular: lungs clear, heart with normal rate and rhythm Abdomen: Incision (s) dry and clean. Significant hematoma supra-pubically and extending to the right labia majora which explains the patient's complaint of abdominal pain worsened by moving.otherwise abdomen is  appropriately tender Extremities: no significant edema and no evidence of DVT  Results for Joanne Mccarty (MRN [id number]) as of 10/03/2015 13:39  Ref. Range 09/16/2015 12:00 10/02/2015 17:21  RBC Latest Ref Range: 3.87-5.11 MIL/uL 4.06 3.22 (L)  Hemoglobin Latest Ref Range: 12.0-15.0 g/dL 13.3 10.4 (L)     Consults: None   Disposition: 01-Home or Self Care     Medication List    TAKE these medications        acetaminophen 500 MG tablet  Commonly known as:  TYLENOL  Take 1,000 mg by mouth every 6 (six) hours as needed. For pain, fever     albuterol 108 (90 Base) MCG/ACT inhaler  Commonly known as:  PROVENTIL HFA;VENTOLIN HFA  Inhale 1-2 puffs into the lungs every 6 (six) hours as needed. For exercise induced asthma symptoms.     ciprofloxacin 500 MG tablet  Commonly known as:  CIPRO  Take 1 tablet (500 mg total) by mouth 2 (two) times daily.     estradiol 1 MG tablet  Commonly known as:  ESTRACE  Take 1 tablet by mouth daily.     ibuprofen 200 MG tablet  Commonly known as:  ADVIL,MOTRIN  Take 400 mg by mouth every 6 (six) hours as needed for moderate pain or cramping.     ketorolac 10 MG tablet  Commonly known as:  TORADOL  Take 1 tablet (10 mg total) by mouth every 6 (six) hours as needed.     mometasone 50 MCG/ACT nasal spray  Commonly known as:  NASONEX  Place 2 sprays into the nose daily as needed (for allergies).     oxyCODONE-acetaminophen 5-325 MG tablet  Commonly known as:  PERCOCET/ROXICET  Take 1-2 tablets by mouth every 4 (four) hours as needed for severe pain (moderate to severe pain (when tolerating fluids)).     oxymetazoline 0.05 % nasal spray  Commonly known as:  AFRIN  Place 1 spray into both nostrils 2 (two) times daily as needed for congestion.     phenazopyridine 100 MG tablet  Commonly known as:  PYRIDIUM  Take 1 tablet (100 mg total) by mouth 3 (three) times daily.     SYNTHROID 100 MCG tablet  Generic drug:  levothyroxine  TAKE 1 TABLET BY MOUTH DAILY BEFORE BREAKFAST.         Signed: Alwyn Pea, MD 10/03/2015, 1:34 PM

## 2015-10-08 ENCOUNTER — Encounter (HOSPITAL_COMMUNITY): Payer: Self-pay

## 2015-10-08 ENCOUNTER — Observation Stay (HOSPITAL_COMMUNITY): Payer: PRIVATE HEALTH INSURANCE

## 2015-10-08 ENCOUNTER — Observation Stay (HOSPITAL_COMMUNITY)
Admission: AD | Admit: 2015-10-08 | Discharge: 2015-10-09 | Disposition: A | Discharge status: Home / Self Care | Payer: PRIVATE HEALTH INSURANCE | Source: Ambulatory Visit | Attending: Obstetrics and Gynecology | Admitting: Obstetrics and Gynecology

## 2015-10-08 DIAGNOSIS — I10 Essential (primary) hypertension: Secondary | ICD-10-CM | POA: Diagnosis not present

## 2015-10-08 DIAGNOSIS — S301XXA Contusion of abdominal wall, initial encounter: Secondary | ICD-10-CM

## 2015-10-08 DIAGNOSIS — J45909 Unspecified asthma, uncomplicated: Secondary | ICD-10-CM | POA: Diagnosis not present

## 2015-10-08 DIAGNOSIS — Z87891 Personal history of nicotine dependence: Secondary | ICD-10-CM | POA: Diagnosis not present

## 2015-10-08 DIAGNOSIS — T148XXA Other injury of unspecified body region, initial encounter: Secondary | ICD-10-CM

## 2015-10-08 DIAGNOSIS — K91840 Postprocedural hemorrhage and hematoma of a digestive system organ or structure following a digestive system procedure: Principal | ICD-10-CM | POA: Insufficient documentation

## 2015-10-08 LAB — CBC WITH DIFFERENTIAL/PLATELET
Basophils Absolute: 0 10*3/uL (ref 0.0–0.1)
Basophils Relative: 0 %
EOS ABS: 0.1 10*3/uL (ref 0.0–0.7)
EOS PCT: 1 %
HCT: 28 % — ABNORMAL LOW (ref 36.0–46.0)
HEMOGLOBIN: 9.4 g/dL — AB (ref 12.0–15.0)
LYMPHS ABS: 1.7 10*3/uL (ref 0.7–4.0)
LYMPHS PCT: 16 %
MCH: 32.3 pg (ref 26.0–34.0)
MCHC: 33.6 g/dL (ref 30.0–36.0)
MCV: 96.2 fL (ref 78.0–100.0)
MONOS PCT: 6 %
Monocytes Absolute: 0.7 10*3/uL (ref 0.1–1.0)
NEUTROS PCT: 77 %
Neutro Abs: 8.4 10*3/uL — ABNORMAL HIGH (ref 1.7–7.7)
Platelets: 285 10*3/uL (ref 150–400)
RBC: 2.91 MIL/uL — ABNORMAL LOW (ref 3.87–5.11)
RDW: 13.2 % (ref 11.5–15.5)
WBC: 10.9 10*3/uL — ABNORMAL HIGH (ref 4.0–10.5)

## 2015-10-08 LAB — COMPREHENSIVE METABOLIC PANEL
ALBUMIN: 3.7 g/dL (ref 3.5–5.0)
ALK PHOS: 49 U/L (ref 38–126)
ALT: 19 U/L (ref 14–54)
ANION GAP: 7 (ref 5–15)
AST: 27 U/L (ref 15–41)
BILIRUBIN TOTAL: 0.9 mg/dL (ref 0.3–1.2)
BUN: 8 mg/dL (ref 6–20)
CALCIUM: 8.9 mg/dL (ref 8.9–10.3)
CO2: 26 mmol/L (ref 22–32)
CREATININE: 0.86 mg/dL (ref 0.44–1.00)
Chloride: 104 mmol/L (ref 101–111)
GFR calc Af Amer: >60 mL/min (ref >60)
GFR calc non Af Amer: >60 mL/min (ref >60)
GLUCOSE: 99 mg/dL (ref 65–99)
Potassium: 4.1 mmol/L (ref 3.5–5.1)
Sodium: 137 mmol/L (ref 135–145)
TOTAL PROTEIN: 6.8 g/dL (ref 6.5–8.1)

## 2015-10-08 LAB — APTT: aPTT: 29 seconds (ref 24–37)

## 2015-10-08 LAB — PROTIME-INR
INR: 0.99 (ref 0.00–1.49)
PROTHROMBIN TIME: 13.3 s (ref 11.6–15.2)

## 2015-10-08 MED ORDER — ZOLPIDEM TARTRATE 5 MG PO TABS
5.0000 mg | ORAL_TABLET | Freq: Every evening | ORAL | Status: DC | PRN
  Filled 2015-10-08: qty 1

## 2015-10-08 MED ORDER — PROMETHAZINE HCL 25 MG/ML IJ SOLN
12.5000 mg | Freq: Four times a day (QID) | INTRAMUSCULAR | Status: DC | PRN
  Administered 2015-10-08: 12.5 mg via INTRAVENOUS
  Filled 2015-10-08: qty 1

## 2015-10-08 MED ORDER — DIATRIZOATE MEGLUMINE & SODIUM 66-10 % PO SOLN
30.0000 mL | Freq: Once | ORAL | Status: AC
  Administered 2015-10-08: 30 mL via ORAL

## 2015-10-08 MED ORDER — OXYCODONE-ACETAMINOPHEN 5-325 MG PO TABS
1.0000 | ORAL_TABLET | ORAL | Status: DC | PRN
  Administered 2015-10-08 – 2015-10-09 (×4): 1 via ORAL
  Filled 2015-10-08 (×3): qty 1
  Filled 2015-10-08: qty 2
  Filled 2015-10-08: qty 1

## 2015-10-08 MED ORDER — DOCUSATE SODIUM 100 MG PO CAPS
100.0000 mg | ORAL_CAPSULE | Freq: Two times a day (BID) | ORAL | Status: DC
Start: 2015-10-08 — End: 2015-10-09
  Administered 2015-10-08 – 2015-10-09 (×2): 100 mg via ORAL
  Filled 2015-10-08 (×2): qty 1

## 2015-10-08 MED ORDER — IBUPROFEN 600 MG PO TABS
600.0000 mg | ORAL_TABLET | Freq: Four times a day (QID) | ORAL | Status: DC | PRN
  Administered 2015-10-09: 600 mg via ORAL
  Filled 2015-10-08: qty 1

## 2015-10-08 MED ORDER — LEVOTHYROXINE SODIUM 100 MCG PO TABS
100.0000 ug | ORAL_TABLET | Freq: Every day | ORAL | Status: DC
  Administered 2015-10-09: 100 ug via ORAL
  Filled 2015-10-08 (×2): qty 1

## 2015-10-08 MED ORDER — IOPAMIDOL (ISOVUE-300) INJECTION 61%
100.0000 mL | Freq: Once | INTRAVENOUS | Status: AC | PRN
  Administered 2015-10-08: 100 mL via INTRAVENOUS

## 2015-10-08 MED ORDER — LEVOFLOXACIN 500 MG PO TABS
500.0000 mg | ORAL_TABLET | Freq: Every day | ORAL | Status: DC
  Administered 2015-10-09: 500 mg via ORAL
  Filled 2015-10-08 (×2): qty 1

## 2015-10-08 NOTE — Progress Notes (Signed)
Ct-scan and all findings reviewed with patient Pelvic hematoma 11.5 cm through space of retzius with no evidence of active bleeding Otherwise imaging WNL Will optimize pain management overnight and discharge in am with Levaquin prophylaxis for 7 days Patient agreeable

## 2015-10-08 NOTE — Progress Notes (Signed)
Pt sent down to CT.

## 2015-10-08 NOTE — H&P (Signed)
Joanne Mccarty is an 50 y.o. female. Pt sent from office with report of abdominal wall hematoma.  She has HA and some nausea.  No vomiting.  Last BM today.  Pertinent Gynecological History: H/o hysterectomy   Patient's last menstrual period was 08/13/2011.    Past Medical History  Diagnosis Date  . History of chickenpox   . Abdominal bruit     fem neg doppler exam  . Hypothyroidism   . Anemia   . History of hypertension 1998    resolved  . Asthma     exercise induced-weather  . PONV (postoperative nausea and vomiting)   . Hx of pyelonephritis 2013    after surgery culture negative   . Rectal pressure 06/04/2011    while running   . Metatarsal stress fracture 07/22/2011    Based on scan and exam 80% likelihood of stress fracture but appears early   . Galactorrhea 06/21/2010  . Internal hemorrhoids   . Hypertension     history of HTN- no meds currently    Past Surgical History  Procedure Laterality Date  . Dysplastic lesion       x 3 4/08  . Endometrial ablation  april 2009  . Breast surgery      left bx  . Novasure ablation    . Dilation and curettage of uterus  12/2007  . Robotic cystectomy  02/2010  . Vaginal hysterectomy  09/08/2011    Procedure: HYSTERECTOMY VAGINAL;  Surgeon: Alwyn Pea, MD;  Location: [facility];  Service: Gynecology;  Laterality: N/A;  . Rectocele repair  09/08/2011    Procedure: POSTERIOR REPAIR (RECTOCELE);  Surgeon: Alwyn Pea, MD;  Location: [facility];  Service: Gynecology;;  . Colonoscopy  03/10/2012    Procedure: COLONOSCOPY;  Surgeon: Lafayette Dragon, MD;  Location: [facility];  Service: Endoscopy;  Laterality: N/A;  . Hemorrhoid banding  03/10/2012    Procedure: HEMORRHOID BANDING;  Surgeon: Lafayette Dragon, MD;  Location: [facility];  Service: Endoscopy;  Laterality: N/A;    Family History  Problem Relation Age of Onset  . Hypertension Mother   . Melanoma Mother   . Hyperlipidemia Father   . Crohn's disease Brother   .  Hypothyroidism Son     2011  . Diabetes Maternal Grandfather   . Colon cancer Neg Hx   . Diabetes Maternal Uncle     Social History:  reports that she quit smoking about 28 years ago. Her smoking use included Cigarettes. She quit after 5 years of use. She has never used smokeless tobacco. She reports that she drinks about 8.4 oz of alcohol per week. She reports that she does not use illicit drugs.  Allergies:  Allergies  Allergen Reactions  . Cephalexin Rash    Prescriptions prior to admission  Medication Sig Dispense Refill Last Dose  . acetaminophen (TYLENOL) 500 MG tablet Take 1,000 mg by mouth every 6 (six) hours as needed. For pain, fever   Unknown at Unknown time  . albuterol (PROVENTIL HFA;VENTOLIN HFA) 108 (90 BASE) MCG/ACT inhaler Inhale 1-2 puffs into the lungs every 6 (six) hours as needed. For exercise induced asthma symptoms.   More than a month at Unknown time  . ciprofloxacin (CIPRO) 500 MG tablet Take 1 tablet (500 mg total) by mouth 2 (two) times daily. 6 tablet 0   . estradiol (ESTRACE) 1 MG tablet Take 1 tablet by mouth daily.  4 10/01/2015 at Unknown time  . ibuprofen (ADVIL,MOTRIN) 200 MG  tablet Take 400 mg by mouth every 6 (six) hours as needed for moderate pain or cramping.   Past Week at Unknown time  . ketorolac (TORADOL) 10 MG tablet Take 1 tablet (10 mg total) by mouth every 6 (six) hours as needed. 30 tablet 1   . mometasone (NASONEX) 50 MCG/ACT nasal spray Place 2 sprays into the nose daily as needed (for allergies).   Past Week at Unknown time  . oxyCODONE-acetaminophen (PERCOCET/ROXICET) 5-325 MG tablet Take 1-2 tablets by mouth every 4 (four) hours as needed for severe pain (moderate to severe pain (when tolerating fluids)). 30 tablet 0   . oxymetazoline (AFRIN) 0.05 % nasal spray Place 1 spray into both nostrils 2 (two) times daily as needed for congestion.   Past Week at Unknown time  . phenazopyridine (PYRIDIUM) 100 MG tablet Take 1 tablet (100 mg total) by  mouth 3 (three) times daily. 10 tablet 0   . SYNTHROID 100 MCG tablet TAKE 1 TABLET BY MOUTH DAILY BEFORE BREAKFAST. 30 tablet 4 10/02/2015 at 0630    ROS Non-contributory  Blood pressure 108/65, pulse 77, temperature 98.3 F (36.8 C), temperature source Oral, resp. rate 18, height 5\' 6"  (1.676 m), weight 132 lb 8 oz (60.102 kg), last menstrual period 08/13/2011, SpO2 100 %. Physical Exam  Lungs CTA CV RRR Abd slightly tender, NABS Ext no calf tenderness  No results found for this or any previous visit (from the past 24 hour(s)).  No results found.  Assessment/Plan: P3 s/p TVT/Anterior Repair and Cystoscopy with abdominal, pelvic wall and vulvar bruising.  Will check labs and order CT scan.  If WBC elevated with a shift, will start abx.   Delice Lesch 10/08/2015, 5:11 PM

## 2015-10-08 NOTE — Progress Notes (Signed)
CT paged, patient is ready for scan, awaiting call back

## 2015-10-09 ENCOUNTER — Encounter (HOSPITAL_COMMUNITY): Payer: Self-pay | Admitting: Obstetrics and Gynecology

## 2015-10-09 DIAGNOSIS — K91840 Postprocedural hemorrhage and hematoma of a digestive system organ or structure following a digestive system procedure: Secondary | ICD-10-CM | POA: Diagnosis not present

## 2015-10-09 LAB — CBC WITH DIFFERENTIAL/PLATELET
BASOS ABS: 0 10*3/uL (ref 0.0–0.1)
Basophils Relative: 0 %
EOS PCT: 2 %
Eosinophils Absolute: 0.2 10*3/uL (ref 0.0–0.7)
HCT: 26.3 % — ABNORMAL LOW (ref 36.0–46.0)
Hemoglobin: 8.9 g/dL — ABNORMAL LOW (ref 12.0–15.0)
LYMPHS PCT: 20 %
Lymphs Abs: 1.9 10*3/uL (ref 0.7–4.0)
MCH: 32.5 pg (ref 26.0–34.0)
MCHC: 33.8 g/dL (ref 30.0–36.0)
MCV: 96 fL (ref 78.0–100.0)
Monocytes Absolute: 0.8 10*3/uL (ref 0.1–1.0)
Monocytes Relative: 8 %
NEUTROS PCT: 70 %
Neutro Abs: 6.6 10*3/uL (ref 1.7–7.7)
PLATELETS: 248 10*3/uL (ref 150–400)
RBC: 2.74 MIL/uL — ABNORMAL LOW (ref 3.87–5.11)
RDW: 13.3 % (ref 11.5–15.5)
WBC: 9.4 10*3/uL (ref 4.0–10.5)

## 2015-10-09 MED ORDER — PROMETHAZINE HCL 25 MG PO TABS
12.5000 mg | ORAL_TABLET | Freq: Four times a day (QID) | ORAL | Status: DC | PRN
  Administered 2015-10-09: 12.5 mg via ORAL
  Filled 2015-10-09: qty 1

## 2015-10-09 MED ORDER — LEVOFLOXACIN 500 MG PO TABS: 500.0000 mg | ORAL_TABLET | Freq: Every day | ORAL | Status: DC

## 2015-10-09 NOTE — Discharge Summary (Signed)
Physician Discharge Summary  Patient ID: Joanne Mccarty MRN: [id number] DOB/AGE: Sep 30, 1965 50 y.o.  Admit date: 10/08/2015 Discharge date: 10/09/2015  Admission Diagnoses: Abdominal Wall Hematoma  Discharge Diagnoses:  Active Problems:   Hematoma   Discharged Condition: stable  Hospital Course: Pt admitted for observation due to bruising and abdominal wall hematoma.  CT scan showed stable hematoma.  Hgb drifted down a little so pt instructed to come to office for CBC on Friday.  Consults: None  Significant Diagnostic Studies: CT Scan  Treatments: pain mgmt and observation  Discharge Exam: Blood pressure 97/51, pulse 69, temperature 98.3 F (36.8 C), temperature source Oral, resp. rate 16, height 5\' 6"  (1.676 m), weight 132 lb 8 oz (60.102 kg), last menstrual period 08/13/2011, SpO2 100 %. General appearance: alert and no distress Resp: clear to auscultation bilaterally Cardio: regular rate and rhythm GI: soft, left sided tenderness bruising on mons and vulva and upper thighs, NABS Extremities: no calf tenderness  Disposition: 01-Home or Self Care     Medication List    TAKE these medications        acetaminophen 500 MG tablet  Commonly known as:  TYLENOL  Take 1,000 mg by mouth every 6 (six) hours as needed. Reported on 10/08/2015     albuterol 108 (90 Base) MCG/ACT inhaler  Commonly known as:  PROVENTIL HFA;VENTOLIN HFA  Inhale 1-2 puffs into the lungs every 6 (six) hours as needed. For exercise induced asthma symptoms.     estradiol 1 MG tablet  Commonly known as:  ESTRACE  Take 1 tablet by mouth daily.     ibuprofen 200 MG tablet  Commonly known as:  ADVIL,MOTRIN  Take 400 mg by mouth every 6 (six) hours as needed for moderate pain or cramping.     ketorolac 10 MG tablet  Commonly known as:  TORADOL  Take 1 tablet (10 mg total) by mouth every 6 (six) hours as needed.     levofloxacin 500 MG tablet  Commonly known as:  LEVAQUIN  Take 1 tablet (500 mg  total) by mouth daily.     mometasone 50 MCG/ACT nasal spray  Commonly known as:  NASONEX  Place 2 sprays into the nose daily as needed (for allergies).     oxyCODONE-acetaminophen 5-325 MG tablet  Commonly known as:  PERCOCET/ROXICET  Take 1-2 tablets by mouth every 4 (four) hours as needed for severe pain (moderate to severe pain (when tolerating fluids)).     oxymetazoline 0.05 % nasal spray  Commonly known as:  AFRIN  Place 1 spray into both nostrils 2 (two) times daily as needed for congestion.     phenazopyridine 100 MG tablet  Commonly known as:  PYRIDIUM  Take 1 tablet (100 mg total) by mouth 3 (three) times daily.     SYNTHROID 100 MCG tablet  Generic drug:  levothyroxine  TAKE 1 TABLET BY MOUTH DAILY BEFORE BREAKFAST.      ASK your doctor about these medications                       Follow-up Information    Follow up with [facility] On 10/11/2015.   Specialty:  Obstetrics and Gynecology   Why:  Lab visit only on Friday 7/14 for CBC   Contact information:   [address] [place] [phone]      Signed: Delice Lesch 10/09/2015, 12:36 PM

## 2015-10-09 NOTE — Progress Notes (Signed)
Discharge instructions reviewed with patient.  Patient states understanding of home care, medications, activity, signs/symptoms to report to MD and return MD office visit.  Patients significant other and family will assist with her care @ home.  No home  equipment needed, patient has prescriptions and all personal belongings.  Patient discharged via wheelchair in stable condition with staff without incident.  

## 2015-10-09 NOTE — Discharge Instructions (Signed)
Call with temp >100.4 Call with light headedness or dizziness Call with worsening pain Call with heavy bleeding or worse bruising

## 2015-11-08 ENCOUNTER — Encounter: Payer: Self-pay | Admitting: *Deleted

## 2015-11-11 ENCOUNTER — Ambulatory Visit (INDEPENDENT_AMBULATORY_CARE_PROVIDER_SITE_OTHER): Payer: PRIVATE HEALTH INSURANCE | Admitting: Diagnostic Neuroimaging

## 2015-11-11 ENCOUNTER — Encounter: Payer: Self-pay | Admitting: Diagnostic Neuroimaging

## 2015-11-11 VITALS — BP 106/66 | HR 62 | Ht 66.0 in | Wt 130.8 lb

## 2015-11-11 DIAGNOSIS — G43009 Migraine without aura, not intractable, without status migrainosus: Secondary | ICD-10-CM

## 2015-11-11 MED ORDER — ALPRAZOLAM 0.5 MG PO TABS: 0.5000 mg | ORAL_TABLET | ORAL | 0 refills | Status: DC | PRN

## 2015-11-11 MED ORDER — RIZATRIPTAN BENZOATE 10 MG PO TBDP: 10.0000 mg | ORAL_TABLET | ORAL | 11 refills | Status: AC | PRN

## 2015-11-11 NOTE — Progress Notes (Signed)
[facility]  PATIENT: Joanne Mccarty DOB: 02-04-1966  REFERRING CLINICIAN: Constance Holster  HISTORY FROM: patient  REASON FOR VISIT: new consult    HISTORICAL  CHIEF COMPLAINT:  Chief Complaint  Patient presents with  . Other    rm 7, New Pt, possible R trigeminal neuralgia, "right eye pain and headache every 6 weeks, lasts x 3-4 days then goes away, began Nov '16"    HISTORY OF PRESENT ILLNESS:   50 year old right-handed female here for evaluation of right eye pain and headaches. Symptoms started in November 2016. Patient reports right eye pain, with dull throbbing sensation over the right forehead, right ear and right face. Sometimes associated with numbness in the right upper teeth and upper lip. Headaches may affect her 3-4 days per month over the past 1-2 years. Patient sometimes has intermittent left hand tingling sensation, not always associated with headache.  Patient has remote history of "ophthalmic migraine" with scintillating scotoma attacks starting at age 18 years old, never associated with headaches. Patient has family history of headaches and migraines in her 2 sons who take medication.  Since June 2017 patient having at least monthly headache attacks every 6-7 weeks.  Patient went to PCP and ENT for evaluation. Patient referred to me for further consideration of possible migraine headaches.    REVIEW OF SYSTEMS: Full 14 system review of systems performed and negative with exception of: Eye pain ringing in ears easy bruising before meals bleeding headaches numbness insomnia.  ALLERGIES: Allergies  Allergen Reactions  . Cephalexin Rash    HOME MEDICATIONS: Outpatient Medications Prior to Visit  Medication Sig Dispense Refill  . estradiol (ESTRACE) 1 MG tablet Take 1 tablet by mouth daily.  4  . SYNTHROID 100 MCG tablet TAKE 1 TABLET BY MOUTH DAILY BEFORE BREAKFAST. 30 tablet 4  . acetaminophen (TYLENOL) 500 MG tablet Take 1,000 mg by mouth every 6  (six) hours as needed. Reported on 10/08/2015    . albuterol (PROVENTIL HFA;VENTOLIN HFA) 108 (90 BASE) MCG/ACT inhaler Inhale 1-2 puffs into the lungs every 6 (six) hours as needed. For exercise induced asthma symptoms.    Marland Kitchen ibuprofen (ADVIL,MOTRIN) 200 MG tablet Take 400 mg by mouth every 6 (six) hours as needed for moderate pain or cramping.    Marland Kitchen ketorolac (TORADOL) 10 MG tablet Take 1 tablet (10 mg total) by mouth every 6 (six) hours as needed. (Patient not taking: Reported on 11/11/2015) 30 tablet 1  . mometasone (NASONEX) 50 MCG/ACT nasal spray Place 2 sprays into the nose daily as needed (for allergies).    Marland Kitchen oxyCODONE-acetaminophen (PERCOCET/ROXICET) 5-325 MG tablet Take 1-2 tablets by mouth every 4 (four) hours as needed for severe pain (moderate to severe pain (when tolerating fluids)). (Patient not taking: Reported on 11/11/2015) 30 tablet 0  . oxymetazoline (AFRIN) 0.05 % nasal spray Place 1 spray into both nostrils 2 (two) times daily as needed for congestion.    Marland Kitchen levofloxacin (LEVAQUIN) 500 MG tablet Take 1 tablet (500 mg total) by mouth daily. 6 tablet 0  . phenazopyridine (PYRIDIUM) 100 MG tablet Take 1 tablet (100 mg total) by mouth 3 (three) times daily. 10 tablet 0   No facility-administered medications prior to visit.     PAST MEDICAL HISTORY: Past Medical History:  Diagnosis Date  . Abdominal bruit    fem neg doppler exam  . Anemia   . Asthma    exercise induced-weather  . Galactorrhea 06/21/2010  . History of chickenpox   .  History of hypertension 1998   resolved  . Hx of pyelonephritis 2013   after surgery culture negative   . Hypertension    history of HTN- no meds currently  . Hypothyroidism   . Internal hemorrhoids   . Metatarsal stress fracture 07/22/2011   Based on scan and exam 80% likelihood of stress fracture but appears early   . PONV (postoperative nausea and vomiting)   . Rectal pressure 06/04/2011   while running     PAST SURGICAL HISTORY: Past  Surgical History:  Procedure Laterality Date  . ANTERIOR AND POSTERIOR REPAIR N/A 10/02/2015   Procedure: ANTERIOR (CYSTOCELE) REPAIR;  Surgeon: Delsa Bern, MD;  Location: [facility];  Service: Gynecology;  Laterality: N/A;  . BLADDER SUSPENSION N/A 10/02/2015   Procedure: TRANSVAGINAL TAPE (TVT) PROCEDURE;  Surgeon: Everett Graff, MD;  Location: [facility];  Service: Gynecology;  Laterality: N/A;  . BREAST SURGERY     left bx  . COLONOSCOPY  03/10/2012   Procedure: COLONOSCOPY;  Surgeon: Lafayette Dragon, MD;  Location: [facility];  Service: Endoscopy;  Laterality: N/A;  . CYSTOSCOPY N/A 10/02/2015   Procedure: CYSTOSCOPY;  Surgeon: Everett Graff, MD;  Location: [facility];  Service: Gynecology;  Laterality: N/A;  . DILATION AND CURETTAGE OF UTERUS  12/2007  . dysplastic lesion      x 3 4/08  . ENDOMETRIAL ABLATION  april 2009  . HEMORRHOID BANDING  03/10/2012   Procedure: HEMORRHOID BANDING;  Surgeon: Lafayette Dragon, MD;  Location: [facility];  Service: Endoscopy;  Laterality: N/A;  . NOVASURE ABLATION    . RECTOCELE REPAIR  09/08/2011   Procedure: POSTERIOR REPAIR (RECTOCELE);  Surgeon: Alwyn Pea, MD;  Location: [facility];  Service: Gynecology;;  . robotic cystectomy  02/2010  . VAGINAL HYSTERECTOMY  09/08/2011   Procedure: HYSTERECTOMY VAGINAL;  Surgeon: Alwyn Pea, MD;  Location: [facility];  Service: Gynecology;  Laterality: N/A;    FAMILY HISTORY: Family History  Problem Relation Age of Onset  . Hypertension Mother   . Melanoma Mother   . Hyperlipidemia Father   . Crohn's disease Brother   . Hypothyroidism Son     2011  . Cancer Maternal Grandfather     liver  . Diabetes Maternal Uncle   . Cancer Paternal Grandmother   . Cancer Paternal Grandfather   . Colon cancer Neg Hx     SOCIAL HISTORY:  Social History   Social History  . Marital status: Married    Spouse name: N/A  . Number of children: 3  . Years of education: 71   Occupational History  .  Unemployed    Social History Main Topics  . Smoking status: Former Smoker    Years: 5.00    Types: Cigarettes    Quit date: 09/03/1987  . Smokeless tobacco: Never Used  . Alcohol use 8.4 oz/week    14 Glasses of wine per week     Comment: 11/11/15 beer//wine daily  . Drug use: No  . Sexual activity: Not Currently    Birth control/ protection: Surgical     Comment: VAS AND HYST   Other Topics Concern  . 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   G3P3   No tob   2 etoh  2 caffiene per day     PHYSICAL EXAM  GENERAL EXAM/CONSTITUTIONAL: Vitals:  Vitals:   11/11/15 0917  BP: 106/66  Pulse: 62  Weight: 130 lb 12.8 oz (59.3 kg)  Height: 5\' 6"  (1.676 m)     Body mass index is 21.11 kg/m.  Visual Acuity Screening   Right eye Left eye Both eyes  Without correction: 20/20 20/20   With correction:        Patient is in no distress; well developed, nourished and groomed; neck is supple  CARDIOVASCULAR:  Examination of carotid arteries is normal; no carotid bruits  Regular rate and rhythm, no murmurs  Examination of peripheral vascular system by observation and palpation is normal  EYES:  Ophthalmoscopic exam of optic discs and posterior segments is normal; no papilledema or hemorrhages  MUSCULOSKELETAL:  Gait, strength, tone, movements noted in Neurologic exam below  NEUROLOGIC: MENTAL STATUS:  No flowsheet data found.  awake, alert, oriented to person, place and time  recent and remote memory intact  normal attention and concentration  language fluent, comprehension intact, naming intact,   fund of knowledge appropriate  CRANIAL NERVE:   2nd - no papilledema on fundoscopic exam  2nd, 3rd, 4th, 6th - pupils equal and reactive to light, visual fields full to confrontation,  extraocular muscles intact, no nystagmus  5th - facial sensation symmetric  7th - facial strength symmetric  8th - hearing intact  9th - palate elevates symmetrically, uvula midline  11th - shoulder shrug symmetric  12th - tongue protrusion midline  MOTOR:   normal bulk and tone, full strength in the BUE, BLE  SENSORY:   normal and symmetric to light touch, temperature, vibration  COORDINATION:   finger-nose-finger, fine finger movements normal  REFLEXES:   deep tendon reflexes present and symmetric  GAIT/STATION:   narrow based gait; able to walk tandem; romberg is negative    DIAGNOSTIC DATA (LABS, IMAGING, TESTING) - I reviewed patient records, labs, notes, testing and imaging myself where available.  Lab Results  Component Value Date   WBC 9.4 10/09/2015   HGB 8.9 (L) 10/09/2015   HCT 26.3 (L) 10/09/2015   MCV 96.0 10/09/2015   PLT 248 10/09/2015      Component Value Date/Time   NA 137 10/08/2015 1720   K 4.1 10/08/2015 1720   CL 104 10/08/2015 1720   CO2 26 10/08/2015 1720   GLUCOSE 99 10/08/2015 1720   BUN 8 10/08/2015 1720   CREATININE 0.86 10/08/2015 1720   CALCIUM 8.9 10/08/2015 1720   PROT 6.8 10/08/2015 1720   ALBUMIN 3.7 10/08/2015 1720   AST 27 10/08/2015 1720   ALT 19 10/08/2015 1720   ALKPHOS 49 10/08/2015 1720   BILITOT 0.9 10/08/2015 1720   GFRNONAA >60 10/08/2015 1720   GFRAA >60 10/08/2015 1720   Lab Results  Component Value Date   CHOL 183 08/16/2014   HDL 75.30 08/16/2014   LDLCALC 98 08/16/2014   TRIG 48.0 08/16/2014   CHOLHDL 2 08/16/2014   Lab Results  Component Value Date   HGBA1C 5.3 08/16/2014   Lab Results  Component Value Date   VITAMINB12 410 07/13/2008   Lab Results  Component Value Date   TSH 1.75 08/02/2014       ASSESSMENT AND PLAN  50 y.o. year old female here with History of ophthalmic migraine since age 50 years old, now with new onset right-sided throbbing headaches with right lip  numbness. Could represent migraine headache phenomenon. Secondary headache  is less likely. We'll check MRI brain to rule out secondary causes. We'll try rescue medications with rizatriptan.   Ddx: migraine vs secondary headache  1. Migraine without aura and without status migrainosus, not intractable      PLAN:  Orders Placed This Encounter  Procedures  . MR Brain W Wo Contrast    Meds ordered this encounter  Medications  . ALPRAZolam (XANAX) 0.5 MG tablet    Sig: Take 1 tablet (0.5 mg total) by mouth as needed for anxiety (for sedation before MRI scan; take 1 hour before scan; may repeat 15 min before scan).    Dispense:  3 tablet    Refill:  0  . rizatriptan (MAXALT-MLT) 10 MG disintegrating tablet    Sig: Take 1 tablet (10 mg total) by mouth as needed for migraine. May repeat in 2 hours if needed    Dispense:  9 tablet    Refill:  11   Return in about 6 weeks (around 12/23/2015).  I reviewed labs, notes, records myself. I summarized findings and reviewed with patient, for this high risk condition (new onset headache, right mouth numbness) requiring high complexity decision making.    Penni Bombard, MD [id number], [id number] AM Certified in Neurology, Neurophysiology and Neuroimaging  [facility] [address], [place], [place] [address] [phone]

## 2015-11-11 NOTE — Patient Instructions (Addendum)
Thank you for coming to see [place] at [facility]. I hope we have been able to provide you high quality care today.  You may receive a patient satisfaction survey over the next few weeks. We would appreciate your feedback and comments so that we may continue to improve ourselves and the health of our patients.  - I will check MRI brain  - try rizatriptan 72m as needed for breakthrough headache; may repeat x 1 after 2 hours; max 2 tabs per day or 8 per month  - start headache diary (monitor food, caffeine, stress, sleep and other triggers)    ~~~~~~~~~~~~~~~~~~~~~~~~~~~~~~~~~~~~~~~~~~~~~~~~~~~~~~~~~~~~~~~~~  DR. Prerna Harold'S GUIDE TO HAPPY AND HEALTHY LIVING These are some of my general health and wellness recommendations. Some of them may apply to you better than others. Please use common sense as you try these suggestions and feel free to ask me any questions.   ACTIVITY/FITNESS Mental, social, emotional and physical stimulation are very important for brain and body health. Try learning a new activity (arts, music, language, sports, games).  Keep moving your body to the best of your abilities. You can do this at home, inside or outside, the park, community center, gym or anywhere you like. Consider a physical therapist or personal trainer to get started. Consider the app Sworkit. Fitness trackers such as smart-watches, smart-phones or Fitbits can help as well.   NUTRITION Eat more plants: colorful vegetables, nuts, seeds and berries.  Eat less sugar, salt, preservatives and processed foods.  Avoid toxins such as cigarettes and alcohol.  Drink water when you are thirsty. Warm water with a slice of lemon is an excellent morning drink to start the day.  Consider these websites for more information The Nutrition Source (hhttps://www.henry-hernandez.biz/ [employer] Nutrition (wWindowBlog.ch   RELAXATION Consider practicing  mindfulness meditation or other relaxation techniques such as deep breathing, prayer, yoga, tai chi, massage. See website mindful.org or the apps Headspace or Calm to help get started.   SLEEP Try to get at least 7-8+ hours sleep per day. Regular exercise and reduced caffeine will help you sleep better. Practice good sleep hygeine techniques. See website sleep.org for more information.   PLANNING Prepare estate planning, living will, healthcare POA documents. Sometimes this is best planned with the help of an attorney. Theconversationproject.org and agingwithdignity.org are excellent resources.

## 2015-11-21 ENCOUNTER — Telehealth: Payer: Self-pay | Admitting: Diagnostic Neuroimaging

## 2015-11-21 NOTE — Telephone Encounter (Signed)
Pt called said she is wanting to have an open MRI.

## 2015-11-22 NOTE — Telephone Encounter (Signed)
Patient's MRI has been sent over to [facility]. Would you mind calling to let her know?

## 2015-11-22 NOTE — Telephone Encounter (Signed)
Pt was called and given GI phone#.

## 2015-12-05 ENCOUNTER — Ambulatory Visit
Admission: RE | Admit: 2015-12-05 | Discharge: 2015-12-05 | Disposition: A | Discharge status: Home / Self Care | Payer: PRIVATE HEALTH INSURANCE | Source: Ambulatory Visit | Attending: Diagnostic Neuroimaging | Admitting: Diagnostic Neuroimaging

## 2015-12-05 DIAGNOSIS — G43009 Migraine without aura, not intractable, without status migrainosus: Secondary | ICD-10-CM

## 2015-12-05 MED ORDER — GADOBENATE DIMEGLUMINE 529 MG/ML IV SOLN
10.0000 mL | Freq: Once | INTRAVENOUS | Status: AC | PRN
  Administered 2015-12-05: 10 mL via INTRAVENOUS

## 2015-12-11 ENCOUNTER — Telehealth: Payer: Self-pay | Admitting: *Deleted

## 2015-12-11 NOTE — Telephone Encounter (Signed)
Per Dr Raynelle Jan, LVM for patient informing her that her MRI brain results were unremarkable. There are minor spots of scar tissue but nothing significant. These can be a result of prior infection. Advised her Dr Leta Baptist will continue with her  current treatment plan. Reminded of her FU later this month at which time he can review her MRI with her.  Left name, number for any questions.

## 2015-12-27 ENCOUNTER — Encounter: Payer: Self-pay | Admitting: Diagnostic Neuroimaging

## 2015-12-27 ENCOUNTER — Ambulatory Visit (INDEPENDENT_AMBULATORY_CARE_PROVIDER_SITE_OTHER): Payer: PRIVATE HEALTH INSURANCE | Admitting: Diagnostic Neuroimaging

## 2015-12-27 VITALS — BP 97/65 | HR 56 | Wt 128.6 lb

## 2015-12-27 DIAGNOSIS — G43009 Migraine without aura, not intractable, without status migrainosus: Secondary | ICD-10-CM

## 2015-12-27 NOTE — Progress Notes (Signed)
[facility]  PATIENT: Joanne Mccarty DOB: 1965/06/13  REFERRING CLINICIAN: Constance Holster  HISTORY FROM: patient  REASON FOR VISIT: follow up   HISTORICAL  CHIEF COMPLAINT:  Chief Complaint  Patient presents with  . Migraine    rm 6, "only one migraine in past 6 weeks, took Maxalt and it took care of it"  . Follow-up    6 weeks    HISTORY OF PRESENT ILLNESS:   UPDATE 12/27/15: Since last visit, doing well. Only 1 migraine since last visit, and well controlled with rizatriptan. No other new events.   PRIOR HPI (05/17/1965): 50 year old right-handed female here for evaluation of right eye pain and headaches. Symptoms started in November 2016. Patient reports right eye pain, with dull throbbing sensation over the right forehead, right ear and right face. Sometimes associated with numbness in the right upper teeth and upper lip. Headaches may affect her 3-4 days per month over the past 1-2 years. Patient sometimes has intermittent left hand tingling sensation, not always associated with headache. Patient has remote history of "ophthalmic migraine" with scintillating scotoma attacks starting at age 80 years old, never associated with headaches. Patient has family history of headaches and migraines in her 2 sons who take medication. Since June 2017 patient having at least monthly headache attacks every 6-7 weeks. Patient went to PCP and ENT for evaluation. Patient referred to me for further consideration of possible migraine headaches.    REVIEW OF SYSTEMS: Full 14 system review of systems performed and negative with exception of: headache.  ALLERGIES: Allergies  Allergen Reactions  . Cephalexin Rash    HOME MEDICATIONS: Outpatient Medications Prior to Visit  Medication Sig Dispense Refill  . acetaminophen (TYLENOL) 500 MG tablet Take 1,000 mg by mouth every 6 (six) hours as needed. Reported on 10/08/2015    . albuterol (PROVENTIL HFA;VENTOLIN HFA) 108 (90 BASE) MCG/ACT inhaler  Inhale 1-2 puffs into the lungs every 6 (six) hours as needed. For exercise induced asthma symptoms.    . ALPRAZolam (XANAX) 0.5 MG tablet Take 1 tablet (0.5 mg total) by mouth as needed for anxiety (for sedation before MRI scan; take 1 hour before scan; may repeat 15 min before scan). 3 tablet 0  . estradiol (ESTRACE) 1 MG tablet Take 1 tablet by mouth daily.  4  . ibuprofen (ADVIL,MOTRIN) 200 MG tablet Take 400 mg by mouth every 6 (six) hours as needed for moderate pain or cramping.    Marland Kitchen ketorolac (TORADOL) 10 MG tablet Take 1 tablet (10 mg total) by mouth every 6 (six) hours as needed. 30 tablet 1  . mometasone (NASONEX) 50 MCG/ACT nasal spray Place 2 sprays into the nose daily as needed (for allergies).    Marland Kitchen oxyCODONE-acetaminophen (PERCOCET/ROXICET) 5-325 MG tablet Take 1-2 tablets by mouth every 4 (four) hours as needed for severe pain (moderate to severe pain (when tolerating fluids)). 30 tablet 0  . oxymetazoline (AFRIN) 0.05 % nasal spray Place 1 spray into both nostrils 2 (two) times daily as needed for congestion.    . rizatriptan (MAXALT-MLT) 10 MG disintegrating tablet Take 1 tablet (10 mg total) by mouth as needed for migraine. May repeat in 2 hours if needed 9 tablet 11  . SYNTHROID 100 MCG tablet TAKE 1 TABLET BY MOUTH DAILY BEFORE BREAKFAST. 30 tablet 4   No facility-administered medications prior to visit.     PAST MEDICAL HISTORY: Past Medical History:  Diagnosis Date  . Abdominal bruit    fem neg doppler  exam  . Anemia   . Asthma    exercise induced-weather  . Galactorrhea 06/21/2010  . History of chickenpox   . History of hypertension 1998   resolved  . Hx of pyelonephritis 2013   after surgery culture negative   . Hypertension    history of HTN- no meds currently  . Hypothyroidism   . Internal hemorrhoids   . Metatarsal stress fracture 07/22/2011   Based on scan and exam 80% likelihood of stress fracture but appears early   . PONV (postoperative nausea and  vomiting)   . Rectal pressure 06/04/2011   while running     PAST SURGICAL HISTORY: Past Surgical History:  Procedure Laterality Date  . ANTERIOR AND POSTERIOR REPAIR N/A 10/02/2015   Procedure: ANTERIOR (CYSTOCELE) REPAIR;  Surgeon: Delsa Bern, MD;  Location: [facility];  Service: Gynecology;  Laterality: N/A;  . BLADDER SUSPENSION N/A 10/02/2015   Procedure: TRANSVAGINAL TAPE (TVT) PROCEDURE;  Surgeon: Everett Graff, MD;  Location: [facility];  Service: Gynecology;  Laterality: N/A;  . BREAST SURGERY     left bx  . COLONOSCOPY  03/10/2012   Procedure: COLONOSCOPY;  Surgeon: Lafayette Dragon, MD;  Location: [facility];  Service: Endoscopy;  Laterality: N/A;  . CYSTOSCOPY N/A 10/02/2015   Procedure: CYSTOSCOPY;  Surgeon: Everett Graff, MD;  Location: [facility];  Service: Gynecology;  Laterality: N/A;  . DILATION AND CURETTAGE OF UTERUS  12/2007  . dysplastic lesion      x 3 4/08  . ENDOMETRIAL ABLATION  april 2009  . HEMORRHOID BANDING  03/10/2012   Procedure: HEMORRHOID BANDING;  Surgeon: Lafayette Dragon, MD;  Location: [facility];  Service: Endoscopy;  Laterality: N/A;  . NOVASURE ABLATION    . RECTOCELE REPAIR  09/08/2011   Procedure: POSTERIOR REPAIR (RECTOCELE);  Surgeon: Alwyn Pea, MD;  Location: [facility];  Service: Gynecology;;  . robotic cystectomy  02/2010  . VAGINAL HYSTERECTOMY  09/08/2011   Procedure: HYSTERECTOMY VAGINAL;  Surgeon: Alwyn Pea, MD;  Location: [facility];  Service: Gynecology;  Laterality: N/A;    FAMILY HISTORY: Family History  Problem Relation Age of Onset  . Hypertension Mother   . Melanoma Mother   . Hyperlipidemia Father   . Crohn's disease Brother   . Hypothyroidism Son     2011  . Cancer Maternal Grandfather     liver  . Diabetes Maternal Uncle   . Cancer Paternal Grandmother   . Cancer Paternal Grandfather   . Colon cancer Neg Hx     SOCIAL HISTORY:  Social History   Social History  . Marital status: Married    Spouse name: N/A  . Number  of children: 3  . Years of education: 7   Occupational History  .  Unemployed   Social History Main Topics  . Smoking status: Former Smoker    Years: 5.00    Types: Cigarettes    Quit date: 09/03/1987  . Smokeless tobacco: Never Used  . Alcohol use 8.4 oz/week    14 Glasses of wine per week     Comment: 11/11/15 beer//wine daily  . Drug use: No  . Sexual activity: Not Currently    Birth control/ protection: Surgical     Comment: VAS AND HYST   Other Topics Concern  . 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   G3P3   No tob   2 etoh    2 caffiene per day     PHYSICAL EXAM  GENERAL EXAM/CONSTITUTIONAL: Vitals:  Vitals:   12/27/15 1220  BP: 97/65  Pulse: (!) 56  Weight: 128 lb 9.6 oz (58.3 kg)   Body mass index is 20.76 kg/m. No exam data present  Patient is in no distress; well developed, nourished and groomed; neck is supple  CARDIOVASCULAR:  Examination of carotid arteries is normal; no carotid bruits  Regular rate and rhythm, no murmurs  Examination of peripheral vascular system by observation and palpation is normal  EYES:  Ophthalmoscopic exam of optic discs and posterior segments is normal; no papilledema or hemorrhages  MUSCULOSKELETAL:  Gait, strength, tone, movements noted in Neurologic exam below  NEUROLOGIC: MENTAL STATUS:  No flowsheet data found.  awake, alert, oriented to person, place and time  recent and remote memory intact  normal attention and concentration  language fluent, comprehension intact, naming intact,   fund of knowledge appropriate  CRANIAL NERVE:   2nd - no papilledema on fundoscopic exam  2nd, 3rd, 4th, 6th - pupils equal and reactive to light, visual fields full to confrontation, extraocular muscles intact, no  nystagmus  5th - facial sensation symmetric  7th - facial strength symmetric  8th - hearing intact  9th - palate elevates symmetrically, uvula midline  11th - shoulder shrug symmetric  12th - tongue protrusion midline  MOTOR:   normal bulk and tone, full strength in the BUE, BLE  SENSORY:   normal and symmetric to light touch, temperature, vibration  COORDINATION:   finger-nose-finger, fine finger movements normal  REFLEXES:   deep tendon reflexes present and symmetric  GAIT/STATION:   narrow based gait    DIAGNOSTIC DATA (LABS, IMAGING, TESTING) - I reviewed patient records, labs, notes, testing and imaging myself where available.  Lab Results  Component Value Date   WBC 9.4 10/09/2015   HGB 8.9 (L) 10/09/2015   HCT 26.3 (L) 10/09/2015   MCV 96.0 10/09/2015   PLT 248 10/09/2015      Component Value Date/Time   NA 137 10/08/2015 1720   K 4.1 10/08/2015 1720   CL 104 10/08/2015 1720   CO2 26 10/08/2015 1720   GLUCOSE 99 10/08/2015 1720   BUN 8 10/08/2015 1720   CREATININE 0.86 10/08/2015 1720   CALCIUM 8.9 10/08/2015 1720   PROT 6.8 10/08/2015 1720   ALBUMIN 3.7 10/08/2015 1720   AST 27 10/08/2015 1720   ALT 19 10/08/2015 1720   ALKPHOS 49 10/08/2015 1720   BILITOT 0.9 10/08/2015 1720   GFRNONAA >60 10/08/2015 1720   GFRAA >60 10/08/2015 1720   Lab Results  Component Value Date   CHOL 183 08/16/2014   HDL 75.30 08/16/2014   LDLCALC 98 08/16/2014   TRIG 48.0 08/16/2014   CHOLHDL 2 08/16/2014   Lab Results  Component Value Date   HGBA1C 5.3 08/16/2014   Lab Results  Component Value Date   VITAMINB12 410 07/13/2008   Lab Results  Component Value Date   TSH 1.75 08/02/2014       ASSESSMENT AND PLAN  50 y.o. year old female here with history of ophthalmic migraine since age 50 years old, now with new onset right-sided throbbing headaches with right lip numbness. Neuro exam and MRI are unremarkable. Likely represents migraine  headache phenomenon.    Dx: migraine vs secondary headache  1. Migraine without aura and without status migrainosus, not intractable      PLAN: - continue rizatriptan as needed for migraine  Return if symptoms worsen or fail to improve, for return to PCP.     Penni Bombard, MD [id number], [id number] PM Certified in Neurology, Neurophysiology and Neuroimaging  [facility] [address] [place], [place] [address] [phone]

## 2015-12-30 ENCOUNTER — Other Ambulatory Visit (HOSPITAL_COMMUNITY): Payer: Self-pay | Admitting: Obstetrics and Gynecology

## 2015-12-30 DIAGNOSIS — T148XXA Other injury of unspecified body region, initial encounter: Secondary | ICD-10-CM

## 2016-01-06 ENCOUNTER — Ambulatory Visit (HOSPITAL_COMMUNITY)
Admission: RE | Admit: 2016-01-06 | Discharge: 2016-01-06 | Disposition: A | Discharge status: Home / Self Care | Payer: PRIVATE HEALTH INSURANCE | Source: Ambulatory Visit | Attending: Interventional Radiology | Admitting: Interventional Radiology

## 2016-01-06 ENCOUNTER — Ambulatory Visit (HOSPITAL_COMMUNITY)
Admission: RE | Admit: 2016-01-06 | Discharge: 2016-01-06 | Disposition: A | Discharge status: Home / Self Care | Payer: PRIVATE HEALTH INSURANCE | Source: Ambulatory Visit | Attending: Obstetrics and Gynecology | Admitting: Obstetrics and Gynecology

## 2016-01-06 ENCOUNTER — Encounter (HOSPITAL_COMMUNITY): Payer: Self-pay

## 2016-01-06 DIAGNOSIS — N9489 Other specified conditions associated with female genital organs and menstrual cycle: Secondary | ICD-10-CM | POA: Insufficient documentation

## 2016-01-06 DIAGNOSIS — T148XXA Other injury of unspecified body region, initial encounter: Secondary | ICD-10-CM

## 2016-01-06 MED ORDER — IOPAMIDOL (ISOVUE-300) INJECTION 61%
INTRAVENOUS | Status: AC
  Administered 2016-01-06: 100 mL
  Filled 2016-01-06: qty 100

## 2016-01-06 MED ORDER — LIDOCAINE HCL 1 % IJ SOLN
INTRAMUSCULAR | Status: AC
  Filled 2016-01-06: qty 20

## 2016-01-13 ENCOUNTER — Ambulatory Visit (INDEPENDENT_AMBULATORY_CARE_PROVIDER_SITE_OTHER): Payer: PRIVATE HEALTH INSURANCE | Admitting: Internal Medicine

## 2016-01-13 ENCOUNTER — Encounter: Payer: Self-pay | Admitting: Internal Medicine

## 2016-01-13 VITALS — BP 100/70 | Temp 98.3°F | Ht 65.75 in | Wt 128.0 lb

## 2016-01-13 DIAGNOSIS — Z23 Encounter for immunization: Secondary | ICD-10-CM | POA: Diagnosis not present

## 2016-01-13 DIAGNOSIS — Z0000 Encounter for general adult medical examination without abnormal findings: Secondary | ICD-10-CM

## 2016-01-13 DIAGNOSIS — E039 Hypothyroidism, unspecified: Secondary | ICD-10-CM | POA: Diagnosis not present

## 2016-01-13 LAB — TSH: TSH: 1.06 u[IU]/mL (ref 0.35–4.50)

## 2016-01-13 MED ORDER — SYNTHROID 100 MCG PO TABS: ORAL_TABLET | ORAL | 3 refills | Status: DC

## 2016-01-13 NOTE — Patient Instructions (Signed)
Continue lifestyle intervention healthy eating and exercise . Will notify you  of labs when available.      Health Maintenance, Female Adopting a healthy lifestyle and getting preventive care can go a long way to promote health and wellness. Talk with your health care provider about what schedule of regular examinations is right for you. This is a good chance for you to check in with your provider about disease prevention and staying healthy. In between checkups, there are plenty of things you can do on your own. Experts have done a lot of research about which lifestyle changes and preventive measures are most likely to keep you healthy. Ask your health care provider for more information. WEIGHT AND DIET  Eat a healthy diet  Be sure to include plenty of vegetables, fruits, low-fat dairy products, and lean protein.  Do not eat a lot of foods high in solid fats, added sugars, or salt.  Get regular exercise. This is one of the most important things you can do for your health.  Most adults should exercise for at least 150 minutes each week. The exercise should increase your heart rate and make you sweat (moderate-intensity exercise).  Most adults should also do strengthening exercises at least twice a week. This is in addition to the moderate-intensity exercise.  Maintain a healthy weight  Body mass index (BMI) is a measurement that can be used to identify possible weight problems. It estimates body fat based on height and weight. Your health care provider can help determine your BMI and help you achieve or maintain a healthy weight.  For females 20 years of age and older:   A BMI below 18.5 is considered underweight.  A BMI of 18.5 to 24.9 is normal.  A BMI of 25 to 29.9 is considered overweight.  A BMI of 30 and above is considered obese.  Watch levels of cholesterol and blood lipids  You should start having your blood tested for lipids and cholesterol at 49 years of age, then  have this test every 5 years.  You may need to have your cholesterol levels checked more often if:  Your lipid or cholesterol levels are high.  You are older than 50 years of age.  You are at high risk for heart disease.  CANCER SCREENING   Lung Cancer  Lung cancer screening is recommended for adults 55-80 years old who are at high risk for lung cancer because of a history of smoking.  A yearly low-dose CT scan of the lungs is recommended for people who:  Currently smoke.  Have quit within the past 15 years.  Have at least a 30-pack-year history of smoking. A pack year is smoking an average of one pack of cigarettes a day for 1 year.  Yearly screening should continue until it has been 15 years since you quit.  Yearly screening should stop if you develop a health problem that would prevent you from having lung cancer treatment.  Breast Cancer  Practice breast self-awareness. This means understanding how your breasts normally appear and feel.  It also means doing regular breast self-exams. Let your health care provider know about any changes, no matter how small.  If you are in your 20s or 30s, you should have a clinical breast exam (CBE) by a health care provider every 1-3 years as part of a regular health exam.  If you are 40 or older, have a CBE every year. Also consider having a breast X-ray (mammogram) every year.    If you have a family history of breast cancer, talk to your health care provider about genetic screening.  If you are at high risk for breast cancer, talk to your health care provider about having an MRI and a mammogram every year.  Breast cancer gene (BRCA) assessment is recommended for women who have family members with BRCA-related cancers. BRCA-related cancers include:  Breast.  Ovarian.  Tubal.  Peritoneal cancers.  Results of the assessment will determine the need for genetic counseling and BRCA1 and BRCA2 testing. Cervical Cancer Your health  care provider may recommend that you be screened regularly for cancer of the pelvic organs (ovaries, uterus, and vagina). This screening involves a pelvic examination, including checking for microscopic changes to the surface of your cervix (Pap test). You may be encouraged to have this screening done every 3 years, beginning at age 21.  For women ages 30-65, health care providers may recommend pelvic exams and Pap testing every 3 years, or they may recommend the Pap and pelvic exam, combined with testing for human papilloma virus (HPV), every 5 years. Some types of HPV increase your risk of cervical cancer. Testing for HPV may also be done on women of any age with unclear Pap test results.  Other health care providers may not recommend any screening for nonpregnant women who are considered low risk for pelvic cancer and who do not have symptoms. Ask your health care provider if a screening pelvic exam is right for you.  If you have had past treatment for cervical cancer or a condition that could lead to cancer, you need Pap tests and screening for cancer for at least 20 years after your treatment. If Pap tests have been discontinued, your risk factors (such as having a new sexual partner) need to be reassessed to determine if screening should resume. Some women have medical problems that increase the chance of getting cervical cancer. In these cases, your health care provider may recommend more frequent screening and Pap tests. Colorectal Cancer  This type of cancer can be detected and often prevented.  Routine colorectal cancer screening usually begins at 50 years of age and continues through 49 years of age.  Your health care provider may recommend screening at an earlier age if you have risk factors for colon cancer.  Your health care provider may also recommend using home test kits to check for hidden blood in the stool.  A small camera at the end of a tube can be used to examine your colon  directly (sigmoidoscopy or colonoscopy). This is done to check for the earliest forms of colorectal cancer.  Routine screening usually begins at age 50.  Direct examination of the colon should be repeated every 5-10 years through 49 years of age. However, you may need to be screened more often if early forms of precancerous polyps or small growths are found. Skin Cancer  Check your skin from head to toe regularly.  Tell your health care provider about any new moles or changes in moles, especially if there is a change in a mole's shape or color.  Also tell your health care provider if you have a mole that is larger than the size of a pencil eraser.  Always use sunscreen. Apply sunscreen liberally and repeatedly throughout the day.  Protect yourself by wearing long sleeves, pants, a wide-brimmed hat, and sunglasses whenever you are outside. HEART DISEASE, DIABETES, AND HIGH BLOOD PRESSURE   High blood pressure causes heart disease and increases the risk   blood pressure is more likely to develop in:  People who have blood pressure in the high end of the normal range (130-139/85-89 mm Hg).  People who are overweight or obese.  People who are African American.  If you are 18-39 years of age, have your blood pressure checked every 3-5 years. If you are 40 years of age or older, have your blood pressure checked every year. You should have your blood pressure measured twice--once when you are at a hospital or clinic, and once when you are not at a hospital or clinic. Record the average of the two measurements. To check your blood pressure when you are not at a hospital or clinic, you can use:  An automated blood pressure machine at a pharmacy.  A home blood pressure monitor.  If you are between 55 years and 79 years old, ask your health care provider if you should take aspirin to prevent strokes.  Have regular diabetes screenings. This involves taking a blood sample to check your  fasting blood sugar level.  If you are at a normal weight and have a low risk for diabetes, have this test once every three years after 49 years of age.  If you are overweight and have a high risk for diabetes, consider being tested at a younger age or more often. PREVENTING INFECTION  Hepatitis B  If you have a higher risk for hepatitis B, you should be screened for this virus. You are considered at high risk for hepatitis B if:  You were born in a country where hepatitis B is common. Ask your health care provider which countries are considered high risk.  Your parents were born in a high-risk country, and you have not been immunized against hepatitis B (hepatitis B vaccine).  You have HIV or AIDS.  You use needles to inject street drugs.  You live with someone who has hepatitis B.  You have had sex with someone who has hepatitis B.  You get hemodialysis treatment.  You take certain medicines for conditions, including cancer, organ transplantation, and autoimmune conditions. Hepatitis C  Blood testing is recommended for:  Everyone born from 1945 through 1965.  Anyone with known risk factors for hepatitis C. Sexually transmitted infections (STIs)  You should be screened for sexually transmitted infections (STIs) including gonorrhea and chlamydia if:  You are sexually active and are younger than 49 years of age.  You are older than 49 years of age and your health care provider tells you that you are at risk for this type of infection.  Your sexual activity has changed since you were last screened and you are at an increased risk for chlamydia or gonorrhea. Ask your health care provider if you are at risk.  If you do not have HIV, but are at risk, it may be recommended that you take a prescription medicine daily to prevent HIV infection. This is called pre-exposure prophylaxis (PrEP). You are considered at risk if:  You are sexually active and do not regularly use condoms or  know the HIV status of your partner(s).  You take drugs by injection.  You are sexually active with a partner who has HIV. Talk with your health care provider about whether you are at high risk of being infected with HIV. If you choose to begin PrEP, you should first be tested for HIV. You should then be tested every 3 months for as long as you are taking PrEP.  PREGNANCY   If you are   If you are premenopausal and you may become pregnant, ask your health care provider about preconception counseling.  If you may become pregnant, take 400 to 800 micrograms (mcg) of folic acid every day.  If you want to prevent pregnancy, talk to your health care provider about birth control (contraception). OSTEOPOROSIS AND MENOPAUSE   Osteoporosis is a disease in which the bones lose minerals and strength with aging. This can result in serious bone fractures. Your risk for osteoporosis can be identified using a bone density scan.  If you are 65 years of age or older, or if you are at risk for osteoporosis and fractures, ask your health care provider if you should be screened.  Ask your health care provider whether you should take a calcium or vitamin D supplement to lower your risk for osteoporosis.  Menopause may have certain physical symptoms and risks.  Hormone replacement therapy may reduce some of these symptoms and risks. Talk to your health care provider about whether hormone replacement therapy is right for you.  HOME CARE INSTRUCTIONS   Schedule regular health, dental, and eye exams.  Stay current with your immunizations.   Do not use any tobacco products including cigarettes, chewing tobacco, or electronic cigarettes.  If you are pregnant, do not drink alcohol.  If you are breastfeeding, limit how much and how often you drink alcohol.  Limit alcohol intake to no more than 1 drink per day for nonpregnant women. One drink equals 12 ounces of beer, 5 ounces of wine, or 1 ounces of hard  liquor.  Do not use street drugs.  Do not share needles.  Ask your health care provider for help if you need support or information about quitting drugs.  Tell your health care provider if you often feel depressed.  Tell your health care provider if you have ever been abused or do not feel safe at home.   This information is not intended to replace advice given to you by your health care provider. Make sure you discuss any questions you have with your health care provider.   Document Released: 09/29/2010 Document Revised: 04/06/2014 Document Reviewed: 02/15/2013 Elsevier Interactive Patient Education 2016 [employer]  

## 2016-01-13 NOTE — Progress Notes (Signed)
Pre visit review using our clinic review tool, if applicable. No additional management support is needed unless otherwise documented below in the visit note. 

## 2016-01-13 NOTE — Progress Notes (Signed)
Chief Complaint  Patient presents with  . Annual Exam    HPI: Patient  Joanne Mccarty  50 y.o. comes in today for [place] visit   Med check  On thryoid med  No change   thouhgt she had check per gyne .  Who ordered  Last year . Had pelvic surgery cystocele and then abd wall hernia   Under care  For  Migraines and in post menopausal   On low dose  Hrt.   Last hg better in 12 range last month ( not in   EHR)  Health Maintenance  Topic Date Due  . HIV Screening  01/11/2017 (Originally 09/27/1980)  . MAMMOGRAM  06/04/2016  . TETANUS/TDAP  01/26/2023  . 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 GI /GU: No adominal pain, vomiting, change in bowel habits. No blood in the stool. No significant GU symptoms. SKIN/HEME: ,no acute skin rashes suspicious lesions or bleeding. No lymphadenopathy, nodules, masses.  NEURO/ PSYCH:  No neurologic signs such as weakness numbness. No depression anxiety. IMM/ Allergy: No unusual infections.  Allergy .   REST of 12 system review negative except as per HPI   Past Medical History:  Diagnosis Date  . Abdominal bruit    fem neg doppler exam  . Anemia   . Asthma    exercise induced-weather  . Galactorrhea 06/21/2010  . History of chickenpox   . History of hypertension 1998   resolved  . Hx of pyelonephritis 2013   after surgery culture negative   . Hypertension    history of HTN- no meds currently  . Hypothyroidism   . Internal hemorrhoids   . Metatarsal stress fracture 07/22/2011   Based on scan and exam 80% likelihood of  stress fracture but appears early   . PONV (postoperative nausea and vomiting)   . Rectal pressure 06/04/2011   while running     Past Surgical History:  Procedure Laterality Date  . ANTERIOR AND POSTERIOR REPAIR N/A 10/02/2015   Procedure: ANTERIOR (CYSTOCELE) REPAIR;  Surgeon: Delsa Bern, MD;  Location: [facility];  Service: Gynecology;  Laterality: N/A;  . BLADDER SUSPENSION N/A 10/02/2015   Procedure: TRANSVAGINAL TAPE (TVT) PROCEDURE;  Surgeon: Everett Graff, MD;  Location: [facility];  Service: Gynecology;  Laterality: N/A;  . BREAST SURGERY     left bx  . COLONOSCOPY  03/10/2012   Procedure: COLONOSCOPY;  Surgeon: Lafayette Dragon, MD;  Location: [facility];  Service: Endoscopy;  Laterality: N/A;  . CYSTOSCOPY N/A 10/02/2015   Procedure: CYSTOSCOPY;  Surgeon: Everett Graff, MD;  Location: [facility];  Service: Gynecology;  Laterality: N/A;  . DILATION AND CURETTAGE OF UTERUS  12/2007  . dysplastic lesion      x 3 4/08  . ENDOMETRIAL ABLATION  april 2009  . HEMORRHOID BANDING  03/10/2012   Procedure: HEMORRHOID BANDING;  Surgeon: Lafayette Dragon, MD;  Location: [facility];  Service: Endoscopy;  Laterality: N/A;  . NOVASURE ABLATION    . RECTOCELE REPAIR  09/08/2011   Procedure: POSTERIOR REPAIR (RECTOCELE);  Surgeon: Katharine Look  A Rivard, MD;  Location: [facility];  Service: Gynecology;;  . robotic cystectomy  02/2010  . VAGINAL HYSTERECTOMY  09/08/2011   Procedure: HYSTERECTOMY VAGINAL;  Surgeon: Alwyn Pea, MD;  Location: [facility];  Service: Gynecology;  Laterality: N/A;    Family History  Problem Relation Age of Onset  . Hypertension Mother   . Melanoma Mother   . Hyperlipidemia Father   . Crohn's disease Brother   . Hypothyroidism Son     2011  . Cancer Maternal Grandfather     liver  . Diabetes Maternal Uncle   . Cancer Paternal Grandmother   . Cancer Paternal Grandfather   . Colon cancer Neg Hx     Social History   Social History  . Marital status: Married    Spouse name: N/A    . Number of children: 3  . Years of education: 34   Occupational History  .  Unemployed   Social History Main Topics  . Smoking status: Former Smoker    Years: 5.00    Types: Cigarettes    Quit date: 09/03/1987  . Smokeless tobacco: Never Used  . Alcohol use 8.4 oz/week    14 Glasses of wine per week     Comment: 11/11/15 beer//wine daily  . Drug use: No  . Sexual activity: Not Currently    Birth control/ protection: Surgical     Comment: VAS AND HYST   Other Topics Concern  . 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   G3P3   No tob   2 etoh    2 caffiene per day    Outpatient Medications Prior to Visit  Medication Sig Dispense Refill  . acetaminophen (TYLENOL) 500 MG tablet Take 1,000 mg by mouth every 6 (six) hours as needed. Reported on 10/08/2015    . albuterol (PROVENTIL HFA;VENTOLIN HFA) 108 (90 BASE) MCG/ACT inhaler Inhale 1-2 puffs into the lungs every 6 (six) hours as needed. For exercise induced asthma symptoms.    Marland Kitchen estradiol (ESTRACE) 1 MG tablet Take 1 tablet by mouth daily.  4  . ibuprofen (ADVIL,MOTRIN) 200 MG tablet Take 400 mg by mouth every 6 (six) hours as needed for moderate pain or cramping.    . mometasone (NASONEX) 50 MCG/ACT nasal spray Place 2 sprays into the nose daily as needed (for allergies).    Marland Kitchen oxymetazoline (AFRIN) 0.05 % nasal spray Place 1 spray into both nostrils 2 (two) times daily as needed for congestion.    . rizatriptan (MAXALT-MLT) 10 MG disintegrating tablet Take 1 tablet (10 mg total) by mouth as needed for migraine. May repeat in 2 hours if needed 9 tablet 11  . SYNTHROID 100 MCG tablet TAKE 1 TABLET BY MOUTH DAILY BEFORE BREAKFAST. 30 tablet 4  . ALPRAZolam (XANAX) 0.5 MG tablet Take 1 tablet (0.5 mg total) by mouth as needed for anxiety (for sedation before  MRI scan; take 1 hour before scan; may repeat 15 min before scan). (Patient not taking: Reported on 01/13/2016) 3 tablet 0  . ketorolac (TORADOL) 10 MG tablet Take 1 tablet (10 mg total) by mouth every 6 (six) hours as needed. 30 tablet 1  . oxyCODONE-acetaminophen (PERCOCET/ROXICET) 5-325 MG tablet Take 1-2 tablets by mouth every 4 (four) hours as needed for severe pain (  moderate to severe pain (when tolerating fluids)). 30 tablet 0   No facility-administered medications prior to visit.      EXAM:  BP 100/70 (BP Location: Left Arm, Patient Position: Sitting, Cuff Size: Normal)   Temp 98.3 F (36.8 C) (Oral)   Ht 5' 5.75" (1.67 m)   Wt 128 lb (58.1 kg)   LMP 08/13/2011   BMI 20.82 kg/m   Body mass index is 20.82 kg/m.  Physical Exam: Vital signs reviewed ELF:YBOF is a well-developed well-nourished alert cooperative    who appearsr stated age in no acute distress.  HEENT: normocephalic atraumatic , Eyes: PERRL EOM's full, conjunctiva clear, Nares: paten,t no deformity discharge or tenderness., Ears: no deformity EAC's clear TMs with normal landmarks. Mouth: clear OP, no lesions, edema.  Moist mucous membranes. Dentition in adequate repair. NECK: supple without masses, thyromegaly or bruits. CHEST/PULM:  Clear to auscultation and percussion breath sounds equal no wheeze , rales or rhonchi. No chest wall deformities or tenderness. CV: PMI is nondisplaced, S1 S2 no gallops, murmurs, rubs. Peripheral pulses are full without delay.No JVD . Breast: normal by inspection . No dimpling, discharge, masses, tenderness or discharge .axilla clear  ABDOMEN: Bowel sounds normal nontender  No guard or rebound, no hepato splenomegal no CVA tenderness.  No hernia. Extremtities:  No clubbing cyanosis or edema, no acute joint swelling or redness no focal atrophy NEURO:  Oriented x3, cranial nerves 3-12 appear to be intact, no obvious focal weakness,gait within normal limits no abnormal reflexes or  asymmetrical SKIN: No acute rashes normal turgor, color, no bruising or petechiae. Sun changes  PSYCH: Oriented, good eye contact, no obvious depression anxiety, cognition and judgment appear normal. LN: no cervical axillary inguinal adenopathy  Lab Results  Component Value Date   WBC 9.4 10/09/2015   HGB 8.9 (L) 10/09/2015   HCT 26.3 (L) 10/09/2015   PLT 248 10/09/2015   GLUCOSE 99 10/08/2015   CHOL 183 08/16/2014   TRIG 48.0 08/16/2014   HDL 75.30 08/16/2014   LDLCALC 98 08/16/2014   ALT 19 10/08/2015   AST 27 10/08/2015   NA 137 10/08/2015   K 4.1 10/08/2015   CL 104 10/08/2015   CREATININE 0.86 10/08/2015   BUN 8 10/08/2015   CO2 26 10/08/2015   TSH 1.75 08/02/2014   INR 0.99 10/08/2015   HGBA1C 5.3 08/16/2014    ASSESSMENT AND PLAN:  Discussed the following assessment and plan:  Visit for preventive health examination  Hypothyroidism, unspecified type - Plan: TSH  Need for prophylactic vaccination and inoculation against influenza - Plan: Flu Vaccine QUAD 36+ mos PF IM (Fluarix & Fluzone Quad PF)  Patient Care Team: Burnis Medin, MD as PCP - General Delsa Bern, MD (Obstetrics and Gynecology) Patient Instructions  Continue lifestyle intervention healthy eating and exercise . Will notify you  of labs when available.   Health Maintenance, Female Adopting a healthy lifestyle and getting preventive care can go a long way to promote health and wellness. Talk with your health care provider about what schedule of regular examinations is right for you. This is a good chance for you to check in with your provider about disease prevention and staying healthy. In between checkups, there are plenty of things you can do on your own. Experts have done a lot of research about which lifestyle changes and preventive measures are most likely to keep you healthy. Ask your health care provider for more information. WEIGHT AND DIET  Eat a healthy diet  Be sure to include  plenty of vegetables, fruits, low-fat dairy products, and lean protein.  Do not eat a lot of foods high in solid fats, added sugars, or salt.  Get regular exercise. This is one of the most important things you can do for your health.  Most adults should exercise for at least 150 minutes each week. The exercise should increase your heart rate and make you sweat (moderate-intensity exercise).  Most adults should also do strengthening exercises at least twice a week. This is in addition to the moderate-intensity exercise.  Maintain a healthy weight  Body mass index (BMI) is a measurement that can be used to identify possible weight problems. It estimates body fat based on height and weight. Your health care provider can help determine your BMI and help you achieve or maintain a healthy weight.  For females 19 years of age and older:   A BMI below 18.5 is considered underweight.  A BMI of 18.5 to 24.9 is normal.  A BMI of 25 to 29.9 is considered overweight.  A BMI of 30 and above is considered obese.  Watch levels of cholesterol and blood lipids  You should start having your blood tested for lipids and cholesterol at 49 years of age, then have this test every 5 years.  You may need to have your cholesterol levels checked more often if:  Your lipid or cholesterol levels are high.  You are older than 49 years of age.  You are at high risk for heart disease.  CANCER SCREENING   Lung Cancer  Lung cancer screening is recommended for adults 12-10 years old who are at high risk for lung cancer because of a history of smoking.  A yearly low-dose CT scan of the lungs is recommended for people who:  Currently smoke.  Have quit within the past 15 years.  Have at least a 30-pack-year history of smoking. A pack year is smoking an average of one pack of cigarettes a day for 1 year.  Yearly screening should continue until it has been 15 years since you quit.  Yearly screening  should stop if you develop a health problem that would prevent you from having lung cancer treatment.  Breast Cancer  Practice breast self-awareness. This means understanding how your breasts normally appear and feel.  It also means doing regular breast self-exams. Let your health care provider know about any changes, no matter how small.  If you are in your 20s or 30s, you should have a clinical breast exam (CBE) by a health care provider every 1-3 years as part of a regular health exam.  If you are 84 or older, have a CBE every year. Also consider having a breast X-ray (mammogram) every year.  If you have a family history of breast cancer, talk to your health care provider about genetic screening.  If you are at high risk for breast cancer, talk to your health care provider about having an MRI and a mammogram every year.  Breast cancer gene (BRCA) assessment is recommended for women who have family members with BRCA-related cancers. BRCA-related cancers include:  Breast.  Ovarian.  Tubal.  Peritoneal cancers.  Results of the assessment will determine the need for genetic counseling and BRCA1 and BRCA2 testing. Cervical Cancer Your health care provider may recommend that you be screened regularly for cancer of the pelvic organs (ovaries, uterus, and vagina). This screening involves a pelvic examination, including checking for microscopic changes to the surface of your  cervix (Pap test). You may be encouraged to have this screening done every 3 years, beginning at age 91.  For women ages 25-65, health care providers may recommend pelvic exams and Pap testing every 3 years, or they may recommend the Pap and pelvic exam, combined with testing for human papilloma virus (HPV), every 5 years. Some types of HPV increase your risk of cervical cancer. Testing for HPV may also be done on women of any age with unclear Pap test results.  Other health care providers may not recommend any  screening for nonpregnant women who are considered low risk for pelvic cancer and who do not have symptoms. Ask your health care provider if a screening pelvic exam is right for you.  If you have had past treatment for cervical cancer or a condition that could lead to cancer, you need Pap tests and screening for cancer for at least 20 years after your treatment. If Pap tests have been discontinued, your risk factors (such as having a new sexual partner) need to be reassessed to determine if screening should resume. Some women have medical problems that increase the chance of getting cervical cancer. In these cases, your health care provider may recommend more frequent screening and Pap tests. Colorectal Cancer  This type of cancer can be detected and often prevented.  Routine colorectal cancer screening usually begins at 49 years of age and continues through 49 years of age.  Your health care provider may recommend screening at an earlier age if you have risk factors for colon cancer.  Your health care provider may also recommend using home test kits to check for hidden blood in the stool.  A small camera at the end of a tube can be used to examine your colon directly (sigmoidoscopy or colonoscopy). This is done to check for the earliest forms of colorectal cancer.  Routine screening usually begins at age 91.  Direct examination of the colon should be repeated every 5-10 years through 49 years of age. However, you may need to be screened more often if early forms of precancerous polyps or small growths are found. Skin Cancer  Check your skin from head to toe regularly.  Tell your health care provider about any new moles or changes in moles, especially if there is a change in a mole's shape or color.  Also tell your health care provider if you have a mole that is larger than the size of a pencil eraser.  Always use sunscreen. Apply sunscreen liberally and repeatedly throughout the  day.  Protect yourself by wearing long sleeves, pants, a wide-brimmed hat, and sunglasses whenever you are outside. HEART DISEASE, DIABETES, AND HIGH BLOOD PRESSURE   High blood pressure causes heart disease and increases the risk of stroke. High blood pressure is more likely to develop in:  People who have blood pressure in the high end of the normal range (130-139/85-89 mm Hg).  People who are overweight or obese.  People who are African American.  If you are 4-50 years of age, have your blood pressure checked every 3-5 years. If you are 79 years of age or older, have your blood pressure checked every year. You should have your blood pressure measured twice--once when you are at a hospital or clinic, and once when you are not at a hospital or clinic. Record the average of the two measurements. To check your blood pressure when you are not at a hospital or clinic, you can use:  An automated blood  pressure machine at a pharmacy.  A home blood pressure monitor.  If you are between 74 years and 13 years old, ask your health care provider if you should take aspirin to prevent strokes.  Have regular diabetes screenings. This involves taking a blood sample to check your fasting blood sugar level.  If you are at a normal weight and have a low risk for diabetes, have this test once every three years after 49 years of age.  If you are overweight and have a high risk for diabetes, consider being tested at a younger age or more often. PREVENTING INFECTION  Hepatitis B  If you have a higher risk for hepatitis B, you should be screened for this virus. You are considered at high risk for hepatitis B if:  You were born in a country where hepatitis B is common. Ask your health care provider which countries are considered high risk.  Your parents were born in a high-risk country, and you have not been immunized against hepatitis B (hepatitis B vaccine).  You have HIV or AIDS.  You use needles  to inject street drugs.  You live with someone who has hepatitis B.  You have had sex with someone who has hepatitis B.  You get hemodialysis treatment.  You take certain medicines for conditions, including cancer, organ transplantation, and autoimmune conditions. Hepatitis C  Blood testing is recommended for:  Everyone born from [birth year] through 1964.  Anyone with known risk factors for hepatitis C. Sexually transmitted infections (STIs)  You should be screened for sexually transmitted infections (STIs) including gonorrhea and chlamydia if:  You are sexually active and are younger than 49 years of age.  You are older than 49 years of age and your health care provider tells you that you are at risk for this type of infection.  Your sexual activity has changed since you were last screened and you are at an increased risk for chlamydia or gonorrhea. Ask your health care provider if you are at risk.  If you do not have HIV, but are at risk, it may be recommended that you take a prescription medicine daily to prevent HIV infection. This is called pre-exposure prophylaxis (PrEP). You are considered at risk if:  You are sexually active and do not regularly use condoms or know the HIV status of your partner(s).  You take drugs by injection.  You are sexually active with a partner who has HIV. Talk with your health care provider about whether you are at high risk of being infected with HIV. If you choose to begin PrEP, you should first be tested for HIV. You should then be tested every 3 months for as long as you are taking PrEP.  PREGNANCY   If you are premenopausal and you may become pregnant, ask your health care provider about preconception counseling.  If you may become pregnant, take 400 to 800 micrograms (mcg) of folic acid every day.  If you want to prevent pregnancy, talk to your health care provider about birth control (contraception). OSTEOPOROSIS AND MENOPAUSE    Osteoporosis is a disease in which the bones lose minerals and strength with aging. This can result in serious bone fractures. Your risk for osteoporosis can be identified using a bone density scan.  If you are 6 years of age or older, or if you are at risk for osteoporosis and fractures, ask your health care provider if you should be screened.  Ask your health care provider whether you should  take a calcium or vitamin D supplement to lower your risk for osteoporosis.  Menopause may have certain physical symptoms and risks.  Hormone replacement therapy may reduce some of these symptoms and risks. Talk to your health care provider about whether hormone replacement therapy is right for you.  HOME CARE INSTRUCTIONS   Schedule regular health, dental, and eye exams.  Stay current with your immunizations.   Do not use any tobacco products including cigarettes, chewing tobacco, or electronic cigarettes.  If you are pregnant, do not drink alcohol.  If you are breastfeeding, limit how much and how often you drink alcohol.  Limit alcohol intake to no more than 1 drink per day for nonpregnant women. One drink equals 12 ounces of beer, 5 ounces of wine, or 1 ounces of hard liquor.  Do not use street drugs.  Do not share needles.  Ask your health care provider for help if you need support or information about quitting drugs.  Tell your health care provider if you often feel depressed.  Tell your health care provider if you have ever been abused or do not feel safe at home.   This information is not intended to replace advice given to you by your health care provider. Make sure you discuss any questions you have with your health care provider.   Document Released: 09/29/2010 Document Revised: 04/06/2014 Document Reviewed: 02/15/2013 Elsevier Interactive Patient Education 2016 [place] K. Sunnie Odden M.D.

## 2016-01-15 ENCOUNTER — Ambulatory Visit (INDEPENDENT_AMBULATORY_CARE_PROVIDER_SITE_OTHER): Payer: PRIVATE HEALTH INSURANCE | Admitting: Sports Medicine

## 2016-01-15 ENCOUNTER — Encounter: Payer: Self-pay | Admitting: Sports Medicine

## 2016-01-15 VITALS — BP 116/51 | HR 61 | Ht 66.0 in | Wt 126.0 lb

## 2016-01-15 DIAGNOSIS — G8929 Other chronic pain: Secondary | ICD-10-CM | POA: Diagnosis not present

## 2016-01-15 DIAGNOSIS — M545 Low back pain, unspecified: Secondary | ICD-10-CM

## 2016-01-15 MED ORDER — CYCLOBENZAPRINE HCL 10 MG PO TABS: 10.0000 mg | ORAL_TABLET | Freq: Every day | ORAL | 1 refills | Status: DC

## 2016-01-15 NOTE — Assessment & Plan Note (Signed)
She is most likely getting some compression of the nerves with the scoliosis that was observed on her prior CT. She has been accommodating this with leaning to the one side with running. - Flexeril to help with sleeping and muscle pain. - Provided different exercises to help straighten her back to normal. - A lateral post was placed in her right shoe and she is also giving a three-quarter wedge - She will follow-up in 4 weeks. If no improvement will consider formal physical therapy.

## 2016-01-15 NOTE — Patient Instructions (Signed)
Thank you for coming in,   Focus on leaning to the left.   Arms out like a "T" and lean to the left and to the right. This can be done with dumbbells.   Also have arms out like a "T" and reach down with arm to opposite foot on each side.   Stand with arms with "T" and rotate around the waist.   Left lateral plank. Hold for count of 5 and sets of 10-15.    Please feel free to call with any questions or concerns at any time, at [phone]. --Dr. Raeford Razor

## 2016-01-15 NOTE — Progress Notes (Signed)
  AYELEN LAPLUME - 50 y.o. female MRN [id number]  Date of birth: 09/26/1965  SUBJECTIVE:  Including CC & ROS.  Chief Complaint  Patient presents with  . Back Pain    Ms. Hugie is a 50 yo F that is presenting with left-sided back pain. The pain is occurring in her lower back. It started last November or December. She reports that the pain has become worse with running and playing tennis. The pain is exacerbated with prolonged standing. The pain is also worse at night. She has tried a TENS unit as well as ice and ibuprofen. She always notices the pain in the middle of a run or at the end of playing tennis. The pain has stayed the same and denies any inciting event. She denies any radiculopathy, numbness, or tingling. She usually runs 4-5 days per week with 2 days of 4 miles and 2 days of 8-10 miles.  ROS: No unexpected weight loss, fever, chills, swelling, instability, numbness/tingling, redness, otherwise see HPI    HISTORY: Past Medical, Surgical, Social, and Family History Reviewed & Updated per EMR.   Pertinent Historical Findings include: PMSHx -  hypothyroidism, bladder repair with complications of hematoma on the left side. PSHx -  no tobacco use, occasional alcohol use FHx -  noncontributory Medications -Synthroid  DATA REVIEWED: 10/08/15: CT abdomen/ pelvis: This is showing a right sided curve to her lumbar spine indicating scoliosis.  PHYSICAL EXAM:  VS: BP:(!) 116/51  HR:61bpm  TEMP: ( )  RESP:   HT:5\' 6"  (167.6 cm)   WT:126 lb (57.2 kg)  BMI:20.4 PHYSICAL EXAM: Gen: NAD, alert, cooperative with exam, well-appearing HEENT: clear conjunctiva, EOMI CV:  no edema, capillary refill brisk,  Resp: non-labored, normal speech Skin: no rashes, normal turgor  Neuro: no gross deficits.  Psych:  alert and oriented Back:  No tenderness to palpation over the SI joints bilaterally. No tenderness to palpation of the lumbar spine. No tenderness palpation of the greater trochanter  bilaterally. Normal hip flexion. Normal knee flexion and extension. Normal strength with hip flexion. Weakness with hip abduction but equal to right side. Negative straight leg raise bilaterally. Neurovascularly intact. Normal FADIR and FABER  Gait:  She leans slightly to the right side as well as turning out of her right foot Her shoulders became more level and she did not turn out her right foot after a lateral post was placed in her right shoe  ASSESSMENT & PLAN:   Chronic left-sided low back pain without sciatica She is most likely getting some compression of the nerves with the scoliosis that was observed on her prior CT. She has been accommodating this with leaning to the one side with running. - Flexeril to help with sleeping and muscle pain. - Provided different exercises to help straighten her back to normal. - A lateral post was placed in her right shoe and she is also giving a three-quarter wedge - She will follow-up in 4 weeks. If no improvement will consider formal physical therapy.

## 2016-02-13 ENCOUNTER — Ambulatory Visit: Payer: PRIVATE HEALTH INSURANCE | Admitting: Sports Medicine

## 2016-02-14 ENCOUNTER — Encounter: Payer: Self-pay | Admitting: Family Medicine

## 2016-03-11 ENCOUNTER — Ambulatory Visit: Payer: PRIVATE HEALTH INSURANCE | Admitting: Sports Medicine

## 2016-03-11 ENCOUNTER — Other Ambulatory Visit: Payer: Self-pay | Admitting: Internal Medicine

## 2016-03-12 NOTE — Telephone Encounter (Signed)
Denied.  Filled on 01/13/16 for 1 year.  Message sent to the pharmacy to check file.

## 2016-05-19 ENCOUNTER — Encounter: Payer: Self-pay | Admitting: Internal Medicine

## 2016-12-18 ENCOUNTER — Encounter: Payer: Self-pay | Admitting: Internal Medicine

## 2017-01-11 NOTE — Progress Notes (Signed)
Chief Complaint  Patient presents with  . Medication Refill    Synthroid.     HPI: Joanne Mccarty 51 y.o. come in for "med check"  Last visit was  10 17 with me  On thyroid medication replacement   Since   About 2008  Stable on meds since then  Had [place] of thyroid  Hx anemia fro bleeding and has  Had hysterectomy  Doing well   No cv  Says bleeds easily but no  Abnormal bleeding now.  Had labs done per gyne for bleeding in past.  ROS: See pertinent positives and negatives per HPI. No nose bleeds  No cp sob  New sx.   Past Medical History:  Diagnosis Date  . Abdominal bruit    fem neg doppler exam  . Anemia   . Asthma    exercise induced-weather  . Galactorrhea 06/21/2010  . History of chickenpox   . History of hypertension 1998   resolved  . Hx of pyelonephritis 2013   after surgery culture negative   . Hypertension    history of HTN- no meds currently  . Hypothyroidism   . Internal hemorrhoids   . Metatarsal stress fracture 07/22/2011   Based on scan and exam 80% likelihood of stress fracture but appears early   . PONV (postoperative nausea and vomiting)   . Rectal pressure 06/04/2011   while running    Past Surgical History:  Procedure Laterality Date  . ANTERIOR AND POSTERIOR REPAIR N/A 10/02/2015   Procedure: ANTERIOR (CYSTOCELE) REPAIR;  Surgeon: Delsa Bern, MD;  Location: [facility];  Service: Gynecology;  Laterality: N/A;  . BLADDER SUSPENSION N/A 10/02/2015   Procedure: TRANSVAGINAL TAPE (TVT) PROCEDURE;  Surgeon: Everett Graff, MD;  Location: [facility];  Service: Gynecology;  Laterality: N/A;  . BREAST SURGERY     left bx  . COLONOSCOPY  03/10/2012   Procedure: COLONOSCOPY;  Surgeon: Lafayette Dragon, MD;  Location: [facility];  Service: Endoscopy;  Laterality: N/A;  . CYSTOSCOPY N/A 10/02/2015   Procedure: CYSTOSCOPY;  Surgeon: Everett Graff, MD;  Location: [facility];  Service: Gynecology;  Laterality: N/A;  . DILATION AND CURETTAGE OF UTERUS  12/2007  . dysplastic lesion        x 3 4/08  . ENDOMETRIAL ABLATION  april 2009  . HEMORRHOID BANDING  03/10/2012   Procedure: HEMORRHOID BANDING;  Surgeon: Lafayette Dragon, MD;  Location: [facility];  Service: Endoscopy;  Laterality: N/A;  . NOVASURE ABLATION    . RECTOCELE REPAIR  09/08/2011   Procedure: POSTERIOR REPAIR (RECTOCELE);  Surgeon: Alwyn Pea, MD;  Location: [facility];  Service: Gynecology;;  . robotic cystectomy  02/2010  . VAGINAL HYSTERECTOMY  09/08/2011   Procedure: HYSTERECTOMY VAGINAL;  Surgeon: Alwyn Pea, MD;  Location: [facility];  Service: Gynecology;  Laterality: N/A;     Family History  Problem Relation Age of Onset  . Hypertension Mother   . Melanoma Mother   . Hyperlipidemia Father   . Crohn's disease Brother   . Hypothyroidism Son        2011  . Cancer Maternal Grandfather        liver  . Diabetes Maternal Uncle   . Cancer Paternal Grandmother   . Cancer Paternal Grandfather   . Colon cancer Neg Hx     Social History   Social History  . Marital status: Married    Spouse name: N/A  . Number of children: 3  . Years  of education: 23   Occupational History  .  Unemployed   Social History Main Topics  . Smoking status: Former Smoker    Years: 5.00    Types: Cigarettes    Quit date: 09/03/1987  . Smokeless tobacco: Never Used  . Alcohol use 8.4 oz/week    14 Glasses of wine per week     Comment: 11/11/15 beer//wine daily  . Drug use: No  . Sexual activity: Not Currently    Birth control/ protection: Surgical     Comment: VAS AND HYST   Other Topics Concern  . 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   G3P3   No tob   2 etoh    2 caffiene per day    Outpatient Medications Prior to Visit  Medication Sig Dispense Refill  . acetaminophen (TYLENOL) 500 MG tablet Take 1,000 mg by mouth every  6 (six) hours as needed. Reported on 10/08/2015    . albuterol (PROVENTIL HFA;VENTOLIN HFA) 108 (90 BASE) MCG/ACT inhaler Inhale 1-2 puffs into the lungs every 6 (six) hours as needed. For exercise induced asthma symptoms.    . ALPRAZolam (XANAX) 0.5 MG tablet Take 1 tablet (0.5 mg total) by mouth as needed for anxiety (for sedation before MRI scan; take 1 hour before scan; may repeat 15 min before scan). 3 tablet 0  . cyclobenzaprine (FLEXERIL) 10 MG tablet Take 1 tablet (10 mg total) by mouth at bedtime. 30 tablet 1  . ibuprofen (ADVIL,MOTRIN) 200 MG tablet Take 400 mg by mouth every 6 (six) hours as needed for moderate pain or cramping.    . mometasone (NASONEX) 50 MCG/ACT nasal spray Place 2 sprays into the nose daily as needed (for allergies).    Marland Kitchen oxymetazoline (AFRIN) 0.05 % nasal spray Place 1 spray into both nostrils 2 (two) times daily as needed for congestion.    . rizatriptan (MAXALT-MLT) 10 MG disintegrating tablet Take 1 tablet (10 mg total) by mouth as needed for migraine. May repeat in 2 hours if needed 9 tablet 11  . SYNTHROID 100 MCG tablet TAKE 1 TABLET BY MOUTH DAILY BEFORE BREAKFAST. 90 tablet 3  . estradiol (ESTRACE) 1 MG tablet Take 1 tablet by mouth daily.  4   No facility-administered medications prior to visit.      EXAM:  BP 104/64 (BP Location: Left Arm, Patient Position: Sitting, Cuff Size: Normal)   Pulse 69   Temp 98.2 F (36.8 C) (Oral)   Wt 132 lb 6.4 oz (60.1 kg)   LMP 08/13/2011   BMI 21.37 kg/m   Body mass index is 21.37 kg/m.  GENERAL: vitals reviewed and listed above, alert, oriented, appears well hydrated and in no acute distress HEENT: atraumatic, conjunctiva  clear, no obvious abnormalities on inspection of external nose and ears NECK: supple  Palpable  Thyroid goiter mile  Lumpy no discrete nodule  LUNGS: clear to auscultation bilaterally, no wheezes, rales or rhonchi, good air movement CV: HRRR, no clubbing cyanosis or  peripheral edema nl cap  refill  Abdomen:  Sof,t normal bowel sounds without hepatosplenomegaly, no guarding rebound or masses no CVA tenderness fmeoral short bruits  MS: moves all extremities without noticeable focal  abnormality PSYCH: pleasant and cooperative, no obvious depression or anxiety Lab Results  Component Value  Date   WBC 9.4 10/09/2015   HGB 8.9 (L) 10/09/2015   HCT 26.3 (L) 10/09/2015   PLT 248 10/09/2015   GLUCOSE 99 10/08/2015   CHOL 183 08/16/2014   TRIG 48.0 08/16/2014   HDL 75.30 08/16/2014   LDLCALC 98 08/16/2014   ALT 19 10/08/2015   AST 27 10/08/2015   NA 137 10/08/2015   K 4.1 10/08/2015   CL 104 10/08/2015   CREATININE 0.86 10/08/2015   BUN 8 10/08/2015   CO2 26 10/08/2015   TSH 1.06 01/13/2016   INR 0.99 10/08/2015   HGBA1C 5.3 08/16/2014   BP Readings from Last 3 Encounters:  01/12/17 104/64  01/15/16 (!) 116/51  01/13/16 100/70    ASSESSMENT AND PLAN:  Discussed the following assessment and plan:  Hypothyroidism, unspecified type - Plan: CBC with Differential/Platelet, TSH  Need for influenza vaccination - Plan: Flu Vaccine QUAD 6+ mos PF IM (Fluarix Quad PF)  Medication management - Plan: CBC with Differential/Platelet, TSH  History of anemia - Plan: CBC with Differential/Platelet, TSH lab monitoring  Will refill med as appropriate and yearly check  -Patient advised to return or notify health care team  if  new concerns arise.  Patient Instructions  We'll let she know when lab results are back We'll send in refills for medication as appropriate after labs reviewed. Can telling you healthy eating lifestyle Yearly check if you're well.    Standley Brooking. Panosh M.D.

## 2017-01-12 ENCOUNTER — Encounter: Payer: Self-pay | Admitting: Internal Medicine

## 2017-01-12 ENCOUNTER — Ambulatory Visit (INDEPENDENT_AMBULATORY_CARE_PROVIDER_SITE_OTHER): Payer: PRIVATE HEALTH INSURANCE | Admitting: Internal Medicine

## 2017-01-12 VITALS — BP 104/64 | HR 69 | Temp 98.2°F | Wt 132.4 lb

## 2017-01-12 DIAGNOSIS — E039 Hypothyroidism, unspecified: Secondary | ICD-10-CM

## 2017-01-12 DIAGNOSIS — Z862 Personal history of diseases of the blood and blood-forming organs and certain disorders involving the immune mechanism: Secondary | ICD-10-CM

## 2017-01-12 DIAGNOSIS — Z79899 Other long term (current) drug therapy: Secondary | ICD-10-CM

## 2017-01-12 DIAGNOSIS — Z23 Encounter for immunization: Secondary | ICD-10-CM

## 2017-01-12 NOTE — Patient Instructions (Signed)
We'll let she know when lab results are back We'll send in refills for medication as appropriate after labs reviewed. Can telling you healthy eating lifestyle Yearly check if you're well.

## 2017-01-13 LAB — CBC WITH DIFFERENTIAL/PLATELET
BASOS PCT: 0.5 % (ref 0.0–3.0)
Basophils Absolute: 0 10*3/uL (ref 0.0–0.1)
EOS PCT: 4 % (ref 0.0–5.0)
Eosinophils Absolute: 0.3 10*3/uL (ref 0.0–0.7)
HCT: 39.5 % (ref 36.0–46.0)
HEMOGLOBIN: 12.9 g/dL (ref 12.0–15.0)
LYMPHS ABS: 1.7 10*3/uL (ref 0.7–4.0)
Lymphocytes Relative: 24.9 % (ref 12.0–46.0)
MCHC: 32.6 g/dL (ref 30.0–36.0)
MCV: 95.3 fl (ref 78.0–100.0)
MONOS PCT: 8.2 % (ref 3.0–12.0)
Monocytes Absolute: 0.6 10*3/uL (ref 0.1–1.0)
NEUTROS PCT: 62.4 % (ref 43.0–77.0)
Neutro Abs: 4.3 10*3/uL (ref 1.4–7.7)
PLATELETS: 280 10*3/uL (ref 150.0–400.0)
RBC: 4.15 Mil/uL (ref 3.87–5.11)
RDW: 13.8 % (ref 11.5–15.5)
WBC: 6.9 10*3/uL (ref 4.0–10.5)

## 2017-01-13 LAB — TSH: TSH: 0.53 u[IU]/mL (ref 0.35–4.50)

## 2017-01-22 ENCOUNTER — Other Ambulatory Visit: Payer: Self-pay | Admitting: Internal Medicine

## 2017-01-22 MED ORDER — SYNTHROID 100 MCG PO TABS: ORAL_TABLET | ORAL | 3 refills | Status: AC

## 2017-04-02 ENCOUNTER — Other Ambulatory Visit: Payer: Self-pay | Admitting: Obstetrics and Gynecology

## 2017-04-12 ENCOUNTER — Other Ambulatory Visit (INDEPENDENT_AMBULATORY_CARE_PROVIDER_SITE_OTHER): Payer: PRIVATE HEALTH INSURANCE

## 2017-04-12 ENCOUNTER — Other Ambulatory Visit (HOSPITAL_COMMUNITY): Payer: PRIVATE HEALTH INSURANCE

## 2017-04-12 DIAGNOSIS — E039 Hypothyroidism, unspecified: Secondary | ICD-10-CM | POA: Diagnosis not present

## 2017-04-12 LAB — TSH: TSH: 2.15 u[IU]/mL (ref 0.35–4.50)

## 2017-04-13 ENCOUNTER — Telehealth: Payer: Self-pay | Admitting: Internal Medicine

## 2017-04-13 NOTE — Telephone Encounter (Signed)
Copied from [place]. Topic: Quick Communication - See Telephone Encounter >> Apr 13, 2017  3:05 PM Boyd Kerbs wrote: CRM for notification. See Telephone encounter for:   Patient calling with dosage: Synthroid 100 microgram  [facility] - [address], [address] [place] [address] Phone: [phone] Fax: [phone]   04/13/17.

## 2017-04-15 NOTE — Telephone Encounter (Signed)
Refills sent to [facility] in October 2018 with 1 year supply of refills.  Refilled #90 x 3 refills (360 days)  LM for patient to see why refill is being requested again.

## 2017-04-21 NOTE — Telephone Encounter (Signed)
LM for patient - refills x 1 yr sent back on Oct 2018 to [facility]. Should not need a refill.

## 2017-04-26 NOTE — Patient Instructions (Addendum)
Your procedure is scheduled on:  Friday, Feb 15  Enter through the Main Entrance of [facility] at: 8:30 am  Pick up the phone at the desk and dial [phone].  Call this number if you have problems the morning of surgery: [phone].  Remember: Do NOT eat or Do NOT drink clear liquids (including water) after midnight Thursday  Take these medicines the morning of surgery with a SIP OF WATER: synthroid  Bring albuterol inhaler with you on day of surgery.  Stop herbal medications and supplements at this time.  Do NOT wear jewelry (body piercing), metal hair clips/bobby pins, make-up, or nail polish. Do NOT wear lotions, powders, or perfumes.  You may wear deoderant. Do NOT shave for 48 hours prior to surgery. Do NOT bring valuables to the hospital.  Have a responsible adult drive you home and stay with you for 24 hours after your procedure.  Home with husband Vira Agar cell [phone].

## 2017-05-03 ENCOUNTER — Other Ambulatory Visit: Payer: Self-pay

## 2017-05-03 ENCOUNTER — Ambulatory Visit: Payer: PRIVATE HEALTH INSURANCE | Admitting: Diagnostic Neuroimaging

## 2017-05-03 ENCOUNTER — Encounter (HOSPITAL_COMMUNITY)
Admission: RE | Admit: 2017-05-03 | Discharge: 2017-05-03 | Disposition: A | Discharge status: Home / Self Care | Payer: PRIVATE HEALTH INSURANCE | Source: Ambulatory Visit | Attending: Obstetrics and Gynecology | Admitting: Obstetrics and Gynecology

## 2017-05-03 ENCOUNTER — Encounter (HOSPITAL_COMMUNITY): Payer: Self-pay

## 2017-05-03 DIAGNOSIS — R1032 Left lower quadrant pain: Secondary | ICD-10-CM | POA: Insufficient documentation

## 2017-05-03 HISTORY — DX: Headache, unspecified: R51.9

## 2017-05-03 HISTORY — DX: Other seasonal allergic rhinitis: J30.2

## 2017-05-03 HISTORY — DX: Headache: R51

## 2017-05-03 LAB — CBC
HEMATOCRIT: 37 % (ref 36.0–46.0)
HEMOGLOBIN: 12.5 g/dL (ref 12.0–15.0)
MCH: 32.3 pg (ref 26.0–34.0)
MCHC: 33.8 g/dL (ref 30.0–36.0)
MCV: 95.6 fL (ref 78.0–100.0)
Platelets: 285 10*3/uL (ref 150–400)
RBC: 3.87 MIL/uL (ref 3.87–5.11)
RDW: 13.6 % (ref 11.5–15.5)
WBC: 7.7 10*3/uL (ref 4.0–10.5)

## 2017-05-03 NOTE — H&P (Signed)
Joanne Mccarty is a 52 y.o. female P: 3-0-0-3 who presents for laparoscopy with possible lysis of adhesions for chronic pelvic pain.   In 2017 the patient underwent the placement of tension free vaginal tape and  an anterior colporrhaphy that was complicated by a large retropubic hematoma that eventually resolved.   Since that time,  however,  the patient reports daily nagging right lower quadrant pelvic pain that she , rates  4/10 on a 10 point pain scale.    This pain  is made worse with exercise and intercourse.  Occasionally the pain will cause nausea and  she will experience episodes of midline pelvic pressure.  A pelvic ultrasound in November 2018 revealed a surgically absent uterus and cervix; right ovary-2.11 cm and left ovary- 2.30 cm with a small amount of free fluid in the cul-de-sac.  The patient tried physical therapy of the pelvic floor along with TENS however her symptoms persisted and she now wishes to pursue surgical evaluation and management.   Past Medical History  OB History: G: 3;  P: 3-0-0-3;  SVB: 2001,  2003  and 2006  GYN History: menarche: 43 Y    Contracepton Hysterectomy  The patient denies history of sexually transmitted disease.  Remote  history of abnormal PAP smear.   Last PAP smear: 2013-normal   Medical History: Insomnia, Left Breast Fibroadenoma, Ovarian Dermoid Cyst, Pulmonary Nodule, Acne, Dysplastic Nevi,  Hypothyroidism,   Anemia,  Right Foot Stress Fracture,  Raynaud's Syndrome,   Pyelonephritis and Exercise Induced Asthma   Surgical History: 2009 Hysteroscopy D & C with Endometrial Ablation; 2011  Robot Assisted Left  Ovarian Cystectomy (dermoid); 2013 Hysterectomy with Posterior Colporrhaphy;  2016 Left Breast Biopsy (fibroadenoma) and 2017 Placement of Tension Free Vaginal Tape with  Anterior Colporrhaphy. Denies problems with anesthesia or history of blood transfusions  Family History: Hypertension,  Hypercholesterolemia, Diabetes Mellitus, Stomach Cancer,  Migraine, Melanoma, Crohn's Disease and Hyperlipidemia.  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 Sulfa Antibiotics Rash      ROS: Admits to reading  glasses, dyspareunia and pelvic pain  Denies headache, vision changes, nasal congestion, dysphagia, tinnitus, dizziness, hoarseness, cough,  chest pain, shortness of breath, nausea, vomiting, diarrhea,constipation,  urinary frequency, urgency  dysuria, hematuria, vaginitis symptoms,  swelling of joints,easy bruising,  myalgias, arthralgias, skin rashes, unexplained weight loss and except as is mentioned in the history of present illness, patient's review of systems is otherwise negative.   Physical Exam  Bp: 106/64  P: 64 regular    Weight:  133.5 lbs.  Height: 5' 5.75 "  BMI: 21.7  Neck: supple without masses or thyromegaly Lungs: clear to auscultation Heart: regular rate and rhythm Abdomen: soft, non-tender and no organomegaly Pelvic:EGBUS- wnl; vagina-normal rugae; uterus/cervix-surgically absent and  adnexae-no tenderness or masses Extremities:  no clubbing, cyanosis or edema   Assesment:  Chronic Pelvic Pain                       S/P Hysterectomy  Disposition:  A discussion was held with patient regarding the indication for her procedure(s) along with the risks, which include but are not limited to: reaction to anesthesia, damage to adjacent organs, infection,  excessive bleeding and possible need for an open abdominal incision. The patient verbalized understanding of these risks and has consented to proceed with an Operative Laparoscopy with Lysis of Adhesions on May 14, 2017 at 10 a.m.   CSN# [id number]   Bayler Nehring J. Florene Glen, PA-C  for Dr Katharine Look A.  R[place]ard

## 2017-05-14 ENCOUNTER — Ambulatory Visit (HOSPITAL_COMMUNITY): Payer: PRIVATE HEALTH INSURANCE | Admitting: Certified Registered Nurse Anesthetist

## 2017-05-14 ENCOUNTER — Ambulatory Visit (HOSPITAL_COMMUNITY)
Admission: AD | Admit: 2017-05-14 | Discharge: 2017-05-14 | Disposition: A | Discharge status: Home / Self Care | Payer: PRIVATE HEALTH INSURANCE | Source: Ambulatory Visit | Attending: Obstetrics and Gynecology | Admitting: Obstetrics and Gynecology

## 2017-05-14 ENCOUNTER — Encounter (HOSPITAL_COMMUNITY): Payer: Self-pay

## 2017-05-14 ENCOUNTER — Encounter (HOSPITAL_COMMUNITY)
Admission: AD | Disposition: A | Discharge status: Home / Self Care | Payer: Self-pay | Source: Ambulatory Visit | Attending: Obstetrics and Gynecology

## 2017-05-14 DIAGNOSIS — E039 Hypothyroidism, unspecified: Secondary | ICD-10-CM | POA: Diagnosis not present

## 2017-05-14 DIAGNOSIS — N736 Female pelvic peritoneal adhesions (postinfective): Secondary | ICD-10-CM | POA: Diagnosis not present

## 2017-05-14 DIAGNOSIS — I10 Essential (primary) hypertension: Secondary | ICD-10-CM | POA: Diagnosis not present

## 2017-05-14 DIAGNOSIS — J45990 Exercise induced bronchospasm: Secondary | ICD-10-CM | POA: Insufficient documentation

## 2017-05-14 DIAGNOSIS — Z87891 Personal history of nicotine dependence: Secondary | ICD-10-CM | POA: Insufficient documentation

## 2017-05-14 DIAGNOSIS — G4700 Insomnia, unspecified: Secondary | ICD-10-CM | POA: Insufficient documentation

## 2017-05-14 DIAGNOSIS — G8929 Other chronic pain: Secondary | ICD-10-CM | POA: Insufficient documentation

## 2017-05-14 DIAGNOSIS — Z882 Allergy status to sulfonamides status: Secondary | ICD-10-CM | POA: Diagnosis not present

## 2017-05-14 DIAGNOSIS — I7300 Raynaud's syndrome without gangrene: Secondary | ICD-10-CM | POA: Diagnosis not present

## 2017-05-14 DIAGNOSIS — R102 Pelvic and perineal pain: Secondary | ICD-10-CM | POA: Diagnosis not present

## 2017-05-14 HISTORY — PX: LAPAROSCOPIC LYSIS OF ADHESIONS: SHX5905

## 2017-05-14 HISTORY — PX: LAPAROSCOPY: SHX197

## 2017-05-14 SURGERY — LAPAROSCOPY OPERATIVE
Anesthesia: General | Site: Abdomen

## 2017-05-14 MED ORDER — SCOPOLAMINE 1 MG/3DAYS TD PT72
1.0000 | MEDICATED_PATCH | Freq: Once | TRANSDERMAL | Status: DC
  Administered 2017-05-14: 1.5 mg via TRANSDERMAL

## 2017-05-14 MED ORDER — EPHEDRINE 5 MG/ML INJ
INTRAVENOUS | Status: AC
  Filled 2017-05-14: qty 10

## 2017-05-14 MED ORDER — LIDOCAINE HCL (CARDIAC) 20 MG/ML IV SOLN
INTRAVENOUS | Status: DC | PRN
  Administered 2017-05-14: 60 mg via INTRAVENOUS

## 2017-05-14 MED ORDER — FENTANYL CITRATE (PF) 100 MCG/2ML IJ SOLN
25.0000 ug | INTRAMUSCULAR | Status: DC | PRN
  Administered 2017-05-14 (×2): 25 ug via INTRAVENOUS

## 2017-05-14 MED ORDER — KETOROLAC TROMETHAMINE 30 MG/ML IJ SOLN
INTRAMUSCULAR | Status: AC
  Filled 2017-05-14: qty 1

## 2017-05-14 MED ORDER — ONDANSETRON HCL 4 MG/2ML IJ SOLN
INTRAMUSCULAR | Status: AC
  Filled 2017-05-14: qty 2

## 2017-05-14 MED ORDER — METOCLOPRAMIDE HCL 5 MG/ML IJ SOLN: 10.0000 mg | Freq: Once | INTRAMUSCULAR | Status: DC | PRN

## 2017-05-14 MED ORDER — BUPIVACAINE HCL (PF) 0.25 % IJ SOLN
INTRAMUSCULAR | Status: AC
Start: 2017-05-14 — End: ?
  Filled 2017-05-14: qty 30

## 2017-05-14 MED ORDER — SUGAMMADEX SODIUM 200 MG/2ML IV SOLN
INTRAVENOUS | Status: DC | PRN
  Administered 2017-05-14: 125 mg via INTRAVENOUS

## 2017-05-14 MED ORDER — FENTANYL CITRATE (PF) 100 MCG/2ML IJ SOLN
INTRAMUSCULAR | Status: AC
  Administered 2017-05-14: 25 ug via INTRAVENOUS
  Filled 2017-05-14: qty 2

## 2017-05-14 MED ORDER — PROPOFOL 10 MG/ML IV BOLUS
INTRAVENOUS | Status: DC | PRN
  Administered 2017-05-14: 150 mg via INTRAVENOUS

## 2017-05-14 MED ORDER — ROCURONIUM BROMIDE 100 MG/10ML IV SOLN
INTRAVENOUS | Status: DC | PRN
  Administered 2017-05-14: 10 mg via INTRAVENOUS
  Administered 2017-05-14: 40 mg via INTRAVENOUS
  Administered 2017-05-14: 10 mg via INTRAVENOUS

## 2017-05-14 MED ORDER — HYDROCODONE-ACETAMINOPHEN 5-325 MG PO TABS: ORAL_TABLET | ORAL | 0 refills | Status: DC

## 2017-05-14 MED ORDER — DEXAMETHASONE SODIUM PHOSPHATE 4 MG/ML IJ SOLN
INTRAMUSCULAR | Status: DC | PRN
  Administered 2017-05-14: 4 mg via INTRAVENOUS

## 2017-05-14 MED ORDER — MIDAZOLAM HCL 2 MG/2ML IJ SOLN
INTRAMUSCULAR | Status: DC | PRN
  Administered 2017-05-14: 2 mg via INTRAVENOUS

## 2017-05-14 MED ORDER — SUGAMMADEX SODIUM 200 MG/2ML IV SOLN
INTRAVENOUS | Status: AC
  Filled 2017-05-14: qty 2

## 2017-05-14 MED ORDER — MEPERIDINE HCL 25 MG/ML IJ SOLN: 6.2500 mg | INTRAMUSCULAR | Status: DC | PRN

## 2017-05-14 MED ORDER — PROPOFOL 10 MG/ML IV BOLUS
INTRAVENOUS | Status: AC
  Filled 2017-05-14: qty 20

## 2017-05-14 MED ORDER — MIDAZOLAM HCL 2 MG/2ML IJ SOLN
INTRAMUSCULAR | Status: AC
  Filled 2017-05-14: qty 2

## 2017-05-14 MED ORDER — LACTATED RINGERS IV SOLN
INTRAVENOUS | Status: DC
  Administered 2017-05-14 (×2): via INTRAVENOUS

## 2017-05-14 MED ORDER — ROCURONIUM BROMIDE 100 MG/10ML IV SOLN
INTRAVENOUS | Status: AC
  Filled 2017-05-14: qty 1

## 2017-05-14 MED ORDER — IBUPROFEN 600 MG PO TABS: ORAL_TABLET | ORAL | 1 refills | Status: DC

## 2017-05-14 MED ORDER — FENTANYL CITRATE (PF) 250 MCG/5ML IJ SOLN
INTRAMUSCULAR | Status: AC
  Filled 2017-05-14: qty 5

## 2017-05-14 MED ORDER — DEXAMETHASONE SODIUM PHOSPHATE 4 MG/ML IJ SOLN
INTRAMUSCULAR | Status: AC
  Filled 2017-05-14: qty 1

## 2017-05-14 MED ORDER — BUPIVACAINE HCL (PF) 0.25 % IJ SOLN
INTRAMUSCULAR | Status: DC | PRN
  Administered 2017-05-14: 30 mL

## 2017-05-14 MED ORDER — EPHEDRINE SULFATE 50 MG/ML IJ SOLN
INTRAMUSCULAR | Status: DC | PRN
  Administered 2017-05-14 (×2): 5 mg via INTRAVENOUS

## 2017-05-14 MED ORDER — HYDROCODONE-ACETAMINOPHEN 7.5-325 MG PO TABS
ORAL_TABLET | ORAL | Status: AC
  Administered 2017-05-14: 1 via ORAL
  Filled 2017-05-14: qty 1

## 2017-05-14 MED ORDER — ONDANSETRON HCL 4 MG/2ML IJ SOLN
INTRAMUSCULAR | Status: DC | PRN
  Administered 2017-05-14: 4 mg via INTRAVENOUS

## 2017-05-14 MED ORDER — LIDOCAINE HCL (CARDIAC) 20 MG/ML IV SOLN
INTRAVENOUS | Status: AC
  Filled 2017-05-14: qty 5

## 2017-05-14 MED ORDER — HYDROCODONE-ACETAMINOPHEN 7.5-325 MG PO TABS
1.0000 | ORAL_TABLET | Freq: Once | ORAL | Status: AC | PRN
  Administered 2017-05-14: 1 via ORAL

## 2017-05-14 MED ORDER — KETOROLAC TROMETHAMINE 30 MG/ML IJ SOLN
INTRAMUSCULAR | Status: DC | PRN
  Administered 2017-05-14: 30 mg via INTRAVENOUS

## 2017-05-14 MED ORDER — FENTANYL CITRATE (PF) 100 MCG/2ML IJ SOLN
INTRAMUSCULAR | Status: DC | PRN
  Administered 2017-05-14: 50 ug via INTRAVENOUS
  Administered 2017-05-14: 100 ug via INTRAVENOUS
  Administered 2017-05-14: 50 ug via INTRAVENOUS

## 2017-05-14 MED ORDER — SCOPOLAMINE 1 MG/3DAYS TD PT72
MEDICATED_PATCH | TRANSDERMAL | Status: AC
  Administered 2017-05-14: 1.5 mg via TRANSDERMAL
  Filled 2017-05-14: qty 1

## 2017-05-14 SURGICAL SUPPLY — 39 items
ADH SKN CLS APL DERMABOND .7 (GAUZE/BANDAGES/DRESSINGS) ×1
APPLICATOR COTTON TIP 6IN STRL (MISCELLANEOUS) ×3 IMPLANT
BAG SPEC RTRVL LRG 6X4 10 (ENDOMECHANICALS)
BARRIER ADHS 3X4 INTERCEED (GAUZE/BANDAGES/DRESSINGS) IMPLANT
BRR ADH 4X3 ABS CNTRL BYND (GAUZE/BANDAGES/DRESSINGS)
CABLE HIGH FREQUENCY MONO STRZ (ELECTRODE) IMPLANT
DERMABOND ADVANCED (GAUZE/BANDAGES/DRESSINGS) ×2
DERMABOND ADVANCED .7 DNX12 (GAUZE/BANDAGES/DRESSINGS) ×1 IMPLANT
DISSECTOR BLUNT TIP ENDO 5MM (MISCELLANEOUS) ×2 IMPLANT
DRSG OPSITE 4X5.5 SM (GAUZE/BANDAGES/DRESSINGS) ×2 IMPLANT
DRSG OPSITE POSTOP 3X4 (GAUZE/BANDAGES/DRESSINGS) IMPLANT
DURAPREP 26ML APPLICATOR (WOUND CARE) ×3 IMPLANT
FORCEPS CUTTING 33CM 5MM (CUTTING FORCEPS) IMPLANT
FORCEPS CUTTING 45CM 5MM (CUTTING FORCEPS) IMPLANT
GLOVE BIOGEL PI IND STRL 7.0 (GLOVE) ×4 IMPLANT
GLOVE BIOGEL PI INDICATOR 7.0 (GLOVE) ×8
GLOVE ECLIPSE 6.5 STRL STRAW (GLOVE) ×3 IMPLANT
GOWN STRL REUS W/TWL LRG LVL3 (GOWN DISPOSABLE) ×6 IMPLANT
NDL SPNL 22GX7 QUINCKE BK (NEEDLE) IMPLANT
NEEDLE SPNL 22GX7 QUINCKE BK (NEEDLE) IMPLANT
PACK LAPAROSCOPY BASIN (CUSTOM PROCEDURE TRAY) ×3 IMPLANT
PACK TRENDGUARD 450 HYBRID PRO (MISCELLANEOUS) IMPLANT
PACK TRENDGUARD 600 HYBRD PROC (MISCELLANEOUS) IMPLANT
PENCIL BUTTON HOLSTER BLD 10FT (ELECTRODE) ×2 IMPLANT
POUCH SPECIMEN RETRIEVAL 10MM (ENDOMECHANICALS) IMPLANT
PROTECTOR NERVE ULNAR (MISCELLANEOUS) ×6 IMPLANT
SCISSORS LAP 5X35 DISP (ENDOMECHANICALS) IMPLANT
SET IRRIG TUBING LAPAROSCOPIC (IRRIGATION / IRRIGATOR) IMPLANT
SLEEVE XCEL OPT CAN 5 100 (ENDOMECHANICALS) ×3 IMPLANT
SOLUTION ELECTROLUBE (MISCELLANEOUS) IMPLANT
SUT MNCRL AB 3-0 PS2 27 (SUTURE) ×3 IMPLANT
SUT VICRYL 0 UR6 27IN ABS (SUTURE) ×6 IMPLANT
SYRINGE 60CC LL (MISCELLANEOUS) ×2 IMPLANT
TOWEL OR 17X24 6PK STRL BLUE (TOWEL DISPOSABLE) ×6 IMPLANT
TRAY FOLEY CATH SILVER 14FR (SET/KITS/TRAYS/PACK) ×3 IMPLANT
TRENDGUARD 450 HYBRID PRO PACK (MISCELLANEOUS) ×3
TRENDGUARD 600 HYBRID PROC PK (MISCELLANEOUS)
TROCAR BALLN 12MMX100 BLUNT (TROCAR) ×3 IMPLANT
TROCAR XCEL NON-BLD 5MMX100MML (ENDOMECHANICALS) ×3 IMPLANT

## 2017-05-14 NOTE — Anesthesia Procedure Notes (Signed)
Procedure Name: Intubation Date/Time: 05/14/2017 10:15 AM Performed by: Raenette Rover, CRNA Pre-anesthesia Checklist: Emergency Drugs available, Patient identified, Suction available and Patient being monitored Patient Re-evaluated:Patient Re-evaluated prior to induction Oxygen Delivery Method: Circle system utilized Preoxygenation: Pre-oxygenation with 100% oxygen Induction Type: IV induction Ventilation: Mask ventilation without difficulty Laryngoscope Size: Mac and 3 Grade View: Grade I Tube type: Oral Tube size: 7.0 mm Number of attempts: 1 Airway Equipment and Method: Stylet Placement Confirmation: ETT inserted through vocal cords under direct vision,  positive ETCO2,  CO2 detector and breath sounds checked- equal and bilateral Secured at: 21 cm Tube secured with: Tape Dental Injury: Teeth and Oropharynx as per pre-operative assessment

## 2017-05-14 NOTE — Transfer of Care (Signed)
Immediate Anesthesia Transfer of Care Note  Patient: Joanne Mccarty  Procedure(s) Performed: LAPAROSCOPY OPERATIVE (N/A Abdomen) LAPAROSCOPIC LYSIS OF ADHESIONS (N/A Abdomen)  Patient Location: PACU  Anesthesia Type:General  Level of Consciousness: awake, alert , oriented, drowsy and patient cooperative  Airway & Oxygen Therapy: Patient Spontanous Breathing and Patient connected to nasal cannula oxygen  Post-op Assessment: Report given to RN and Post -op Vital signs reviewed and stable  Post vital signs: Reviewed and stable  Last Vitals:  Vitals:   05/14/17 0833  BP: 131/67  Pulse: (!) 55  Resp: 16  Temp: (!) 36.4 C  SpO2: 100%    Last Pain:  Vitals:   05/14/17 0833  TempSrc: Oral      Patients Stated Pain Goal: 4 (57/50/51 8335)  Complications: No apparent anesthesia complications

## 2017-05-14 NOTE — Discharge Instructions (Signed)
PLEASE DO NOT take Motrin/Advil/Ibuprofen until after 5:30 pm today.  Call [facility] @ [phone] if:  You have a temperature greater than or equal to 100.4 degrees Farenheit orally You have pain that is not made better by the pain medication given and taken as directed You have excessive bleeding or problems urinating  Take Colace (Docusate Sodium/Stool Softener) 100 mg 2-3 times daily while taking narcotic pain medicine to avoid constipation or until bowel movements are regular.  You may drive after 24 hours You may walk up steps  You may shower tomorrow You may resume a regular diet  Keep incisions clean and dry Avoid anything in vagina for 6 weeks (or until after your post-operative visit)      General Anesthesia, Adult, Care After These instructions provide you with information about caring for yourself after your procedure. Your health care provider may also give you more specific instructions. Your treatment has been planned according to current medical practices, but problems sometimes occur. Call your health care provider if you have any problems or questions after your procedure. What can I expect after the procedure? After the procedure, it is common to have:  Vomiting.  A sore throat.  Mental slowness.  It is common to feel:  Nauseous.  Cold or shivery.  Sleepy.  Tired.  Sore or achy, even in parts of your body where you did not have surgery.  Follow these instructions at home: For at least 24 hours after the procedure:  Do not: ? Participate in activities where you could fall or become injured. ? Drive. ? Use heavy machinery. ? Drink alcohol. ? Take sleeping pills or medicines that cause drowsiness. ? Make important decisions or sign legal documents. ? Take care of children on your own.  Rest. Eating and drinking  If you vomit, drink water, juice, or soup when you can drink without vomiting.  Drink enough fluid to keep your  urine clear or pale yellow.  Make sure you have little or no nausea before eating solid foods.  Follow the diet recommended by your health care provider. General instructions  Have a responsible adult stay with you until you are awake and alert.  Return to your normal activities as told by your health care provider. Ask your health care provider what activities are safe for you.  Take over-the-counter and prescription medicines only as told by your health care provider.  If you smoke, do not smoke without supervision.  Keep all follow-up visits as told by your health care provider. This is important. Contact a health care provider if:  You continue to have nausea or vomiting at home, and medicines are not helpful.  You cannot drink fluids or start eating again.  You cannot urinate after 8-12 hours.  You develop a skin rash.  You have fever.  You have increasing redness at the site of your procedure. Get help right away if:  You have difficulty breathing.  You have chest pain.  You have unexpected bleeding.  You feel that you are having a life-threatening or urgent problem. This information is not intended to replace advice given to you by your health care provider. Make sure you discuss any questions you have with your health care provider. Document Released: 06/22/2000 Document Revised: 08/19/2015 Document Reviewed: 02/28/2015 Elsevier Interactive Patient Education  2018 [employer]. Diagnostic Laparoscopy, Care After Refer to this sheet in the next few weeks. These instructions provide you with information about caring for yourself after your procedure.  Your health care provider may also give you more specific instructions. Your treatment has been planned according to current medical practices, but problems sometimes occur. Call your health care provider if you have any problems or questions after your procedure. What can I expect after the procedure? After your  procedure, it is common to have mild discomfort in the throat and abdomen. Follow these instructions at home:  Take over-the-counter and prescription medicines only as told by your health care provider.  Do not drive for 24 hours if you received a sedative.  Return to your normal activities as told by your health care provider.  Do not take baths, swim, or use a hot tub until your health care provider approves. You may shower.  Follow instructions from your health care provider about how to take care of your incision. Make sure you: ? Wash your hands with soap and water before you change your bandage (dressing). If soap and water are not available, use hand sanitizer. ? Change your dressing as told by your health care provider. ? Leave stitches (sutures), skin glue, or adhesive strips in place. These skin closures may need to stay in place for 2 weeks or longer. If adhesive strip edges start to loosen and curl up, you may trim the loose edges. Do not remove adhesive strips completely unless your health care provider tells you to do that.  Check your incision area every day for signs of infection. Check for: ? More redness, swelling, or pain. ? More fluid or blood. ? Warmth. ? Pus or a bad smell.  It is your responsibility to get the results of your procedure. Ask your health care provider or the department performing the procedure when your results will be ready. Contact a health care provider if:  There is new pain in your shoulders.  You feel light-headed or faint.  You are unable to pass gas or unable to have a bowel movement.  You feel nauseous or you vomit.  You develop a rash.  You have more redness, swelling, or pain around your incision.  You have more fluid or blood coming from your incision.  Your incision feels warm to the touch.  You have pus or a bad smell coming from your incision.  You have a fever or chills. Get help right away if:  Your pain is getting  worse.  You have ongoing vomiting.  The edges of your incision open up.  You have trouble breathing.  You have chest pain. This information is not intended to replace advice given to you by your health care provider. Make sure you discuss any questions you have with your health care provider. Document Released: 02/25/2015 Document Revised: 08/22/2015 Document Reviewed: 11/27/2014 Elsevier Interactive Patient Education  2018 [employer].

## 2017-05-14 NOTE — Op Note (Signed)
Preoperative diagnosis: Chronic pelvic pain  Postoperative diagnosis: Same  Anesthesia: Gen.  Anesthesiologist: Dr. Royce Macadamia  Procedure: Diagnostic laparoscopy with lysis of adhesions  Surgeon: Dr. Katharine Look Hoby Kawai  Assistant: Earnstine Regal PA-C  Estimated blood loss: Minimal  Procedure:  After being informed of the planned procedure with possible complications including bleeding, infection, injury to other organs,informed consent is obtained and the patient is taken to OR # 7. She is placed in lithotomy position, prepped and draped in a sterile fashion, and a Foley catheter is inserted in  her bladder.  Pelvic exam: Normal external genitalia and normal adnexa. Uterus and cervix are absent.  A sponge on a stick  is inserted in the vagina. We infiltrate the umbilical area using 10 cc of Marcaine 0.25 and perform a semi-elliptical incision which is brought down bluntly to the fascia. The fascia is identified and grasped with Coker forceps. The fascia is opened with Mayo scissors. Peritoneum is entered bluntly. A pursestring suture of 0 Vicryl is placed on the fascia. A 10 mm Hassan trocar is easily inserted in the abdominal cavity and is held in place both by the intrauterine balloon and the previously placed pursestring suture. This allows for easy insufflation of the pneumoperitoneum using warm CO2 at a maximum pressure of 15 mmHg.   Observation: We note adhesions between the top of the vagina down to the rectal area with a thick vascular band. This is under tension without probing the vagina. There is also a left-sided thin grade 1 adhesion with the left side of the vagina and the pelvic wall. The left tube and left ovary are normal. Right tube and right ovary are involved in grade 1 and 2 adhesions with the pelvic wall. Appendix is visualized and normal. Bowels were observed rolling back into the upper abdominal cavity while positioning  and no adhesions were identified. Liver and gallbladder  are normal except for a thick grade 4 adhesive band between the tip of the right lobe of the liver in the colon.  We placed 25 mm trochars in the lower quadrants of the abdomen under visualization after infiltrating with 10 cc of Marcaine 0.25. We proceed with careful dissection of the adhesive band between colon and liver. We then proceed with lysis of adhesion between the right ovary and right tube and the pelvic wall using sharp and hot scissors. We insert a rectal probe to identify rectal mucosa. By moving the rectal probe and the vaginal sponge on a stick we are able to identify margins of resection and takedown the thick band of adhesion between rectum and vagina. We also addressed the left sided vaginal band sharply with hot scissors.  We irrigate profusely with warm saline and note a satisfactory hemostasis. After removing vaginal probe and rectal probe we see no tension remaining. We place a sheet of Interceed between vagina and rectum.  All instruments are then removed after evacuating the pneumoperitoneum. The fascia of the umbilical incision is closed with the purse string suture of 0 Vicryl. The skin is closed with a subcuticular suture of 3-0 Monocryl and Dermabond.  Instrument and sponge count is complete x2. Estimated blood loss is minimal.The procedure is well tolerated by the patient is taken to recovery room in a well and stable condition.    Specimen: None  Joanne Mccarty A  MD

## 2017-05-14 NOTE — Anesthesia Preprocedure Evaluation (Signed)
Anesthesia Evaluation  Patient identified by MRN, date of birth, ID band Patient awake    Reviewed: Allergy & Precautions, NPO status   History of Anesthesia Complications (+) PONV and history of anesthetic complications  Airway Mallampati: I  TM Distance: >3 FB Neck ROM: Full    Dental no notable dental hx. (+) Teeth Intact   Pulmonary asthma , former smoker,    Pulmonary exam normal breath sounds clear to auscultation- rhonchi       Cardiovascular hypertension, Normal cardiovascular exam Rhythm:Regular Rate:Normal     Neuro/Psych  Headaches,  Neuromuscular disease    GI/Hepatic negative GI ROS, Neg liver ROS,   Endo/Other  Hypothyroidism   Renal/GU negative Renal ROS  negative genitourinary   Musculoskeletal negative musculoskeletal ROS (+)   Abdominal   Peds  Hematology  (+) anemia ,   Anesthesia Other Findings   Reproductive/Obstetrics LLQ pelvic pain Hx/o ovarian cyst                             Anesthesia Physical Anesthesia Plan  ASA: II  Anesthesia Plan: General   Post-op Pain Management:    Induction: Intravenous  PONV Risk Score and Plan: 4 or greater and Scopolamine patch - Pre-op, Midazolam, Dexamethasone, Ondansetron and Treatment may vary due to age or medical condition  Airway Management Planned: Oral ETT  Additional Equipment:   Intra-op Plan:   Post-operative Plan: Extubation in OR  Informed Consent: I have reviewed the patients History and Physical, chart, labs and discussed the procedure including the risks, benefits and alternatives for the proposed anesthesia with the patient or authorized representative who has indicated his/her understanding and acceptance.   Dental advisory given  Plan Discussed with: CRNA, Anesthesiologist and Surgeon  Anesthesia Plan Comments:         Anesthesia Quick Evaluation

## 2017-05-14 NOTE — Interval H&P Note (Signed)
History and Physical Interval Note:  05/14/2017 9:55 AM  Joanne Mccarty  has presented today for surgery, with the diagnosis of Pelvic Pain  The various methods of treatment have been discussed with the patient and family. After consideration of risks, benefits and other options for treatment, the patient has consented to  Procedure(s): LAPAROSCOPY OPERATIVE (N/A) as a surgical intervention .  The patient's history has been reviewed, patient examined, no change in status, stable for surgery.  I have reviewed the patient's chart and labs.  Questions were answered to the patient's satisfaction.     Katharine Look A Shewanda Sharpe

## 2017-05-14 NOTE — Anesthesia Postprocedure Evaluation (Signed)
Anesthesia Post Note  Patient: Joanne Mccarty  Procedure(s) Performed: LAPAROSCOPY OPERATIVE (N/A Abdomen) LAPAROSCOPIC LYSIS OF ADHESIONS (N/A Abdomen)     Patient location during evaluation: PACU Anesthesia Type: General Level of consciousness: awake and alert and oriented Pain management: pain level controlled Vital Signs Assessment: post-procedure vital signs reviewed and stable Respiratory status: spontaneous breathing, nonlabored ventilation and respiratory function stable Cardiovascular status: blood pressure returned to baseline and stable Postop Assessment: no apparent nausea or vomiting Anesthetic complications: no    Last Vitals:  Vitals:   05/14/17 1147 05/14/17 1215  BP: 117/62 121/73  Pulse: 73 (!) 57  Resp: 16 16  Temp: 36.9 C   SpO2: 95% 100%    Last Pain:  Vitals:   05/14/17 1215  TempSrc:   PainSc: 6    Pain Goal: Patients Stated Pain Goal: 4 (05/14/17 4665)               Syriana Croslin A.

## 2017-05-15 ENCOUNTER — Encounter (HOSPITAL_COMMUNITY): Payer: Self-pay | Admitting: Obstetrics and Gynecology

## 2017-07-28 LAB — BASIC METABOLIC PANEL
BUN: 13 (ref 4–21)
Creatinine: 1 (ref 0.5–1.1)
GLUCOSE: 86
POTASSIUM: 4.6 (ref 3.4–5.3)
SODIUM: 143 (ref 137–147)

## 2017-07-28 LAB — HEPATIC FUNCTION PANEL
ALT: 17 (ref 7–35)
AST: 24 (ref 13–35)
Alkaline Phosphatase: 48 (ref 25–125)
Bilirubin, Direct: 0.14 (ref 0.01–0.4)
Bilirubin, Total: 0.4

## 2017-08-20 LAB — HM COLONOSCOPY

## 2017-08-22 ENCOUNTER — Encounter (HOSPITAL_BASED_OUTPATIENT_CLINIC_OR_DEPARTMENT_OTHER): Payer: Self-pay | Admitting: Emergency Medicine

## 2017-08-22 ENCOUNTER — Other Ambulatory Visit: Payer: Self-pay

## 2017-08-22 ENCOUNTER — Emergency Department (HOSPITAL_BASED_OUTPATIENT_CLINIC_OR_DEPARTMENT_OTHER): Payer: PRIVATE HEALTH INSURANCE

## 2017-08-22 ENCOUNTER — Emergency Department (HOSPITAL_BASED_OUTPATIENT_CLINIC_OR_DEPARTMENT_OTHER)
Admission: EM | Admit: 2017-08-22 | Discharge: 2017-08-22 | Disposition: A | Discharge status: Home / Self Care | Payer: PRIVATE HEALTH INSURANCE | Attending: Emergency Medicine | Admitting: Emergency Medicine

## 2017-08-22 DIAGNOSIS — Z87891 Personal history of nicotine dependence: Secondary | ICD-10-CM | POA: Insufficient documentation

## 2017-08-22 DIAGNOSIS — Z79899 Other long term (current) drug therapy: Secondary | ICD-10-CM | POA: Insufficient documentation

## 2017-08-22 DIAGNOSIS — E039 Hypothyroidism, unspecified: Secondary | ICD-10-CM | POA: Insufficient documentation

## 2017-08-22 DIAGNOSIS — R1032 Left lower quadrant pain: Secondary | ICD-10-CM | POA: Insufficient documentation

## 2017-08-22 DIAGNOSIS — R110 Nausea: Secondary | ICD-10-CM | POA: Diagnosis not present

## 2017-08-22 DIAGNOSIS — I10 Essential (primary) hypertension: Secondary | ICD-10-CM | POA: Diagnosis not present

## 2017-08-22 DIAGNOSIS — J45909 Unspecified asthma, uncomplicated: Secondary | ICD-10-CM | POA: Diagnosis not present

## 2017-08-22 DIAGNOSIS — R509 Fever, unspecified: Secondary | ICD-10-CM | POA: Diagnosis present

## 2017-08-22 LAB — URINALYSIS, ROUTINE W REFLEX MICROSCOPIC
Glucose, UA: NEGATIVE mg/dL
Hgb urine dipstick: NEGATIVE
Ketones, ur: 15 mg/dL — AB
LEUKOCYTES UA: NEGATIVE
NITRITE: NEGATIVE
PH: 7 (ref 5.0–8.0)
Protein, ur: 30 mg/dL — AB
SPECIFIC GRAVITY, URINE: 1.02 (ref 1.005–1.030)

## 2017-08-22 LAB — CBC WITH DIFFERENTIAL/PLATELET
BASOS ABS: 0 10*3/uL (ref 0.0–0.1)
Basophils Relative: 0 %
EOS ABS: 0 10*3/uL (ref 0.0–0.7)
Eosinophils Relative: 0 %
HCT: 40.1 % (ref 36.0–46.0)
HEMOGLOBIN: 14.1 g/dL (ref 12.0–15.0)
LYMPHS ABS: 0.3 10*3/uL — AB (ref 0.7–4.0)
LYMPHS PCT: 10 %
MCH: 33.3 pg (ref 26.0–34.0)
MCHC: 35.2 g/dL (ref 30.0–36.0)
MCV: 94.8 fL (ref 78.0–100.0)
Monocytes Absolute: 0.2 10*3/uL (ref 0.1–1.0)
Monocytes Relative: 9 %
Neutro Abs: 2 10*3/uL (ref 1.7–7.7)
Neutrophils Relative %: 81 %
PLATELETS: 157 10*3/uL (ref 150–400)
RBC: 4.23 MIL/uL (ref 3.87–5.11)
RDW: 12.5 % (ref 11.5–15.5)
WBC: 2.4 10*3/uL — AB (ref 4.0–10.5)

## 2017-08-22 LAB — COMPREHENSIVE METABOLIC PANEL
ALBUMIN: 3.6 g/dL (ref 3.5–5.0)
ALK PHOS: 109 U/L (ref 38–126)
ALT: 437 U/L — ABNORMAL HIGH (ref 14–54)
ANION GAP: 8 (ref 5–15)
AST: 741 U/L — ABNORMAL HIGH (ref 15–41)
BUN: 10 mg/dL (ref 6–20)
CHLORIDE: 103 mmol/L (ref 101–111)
CO2: 25 mmol/L (ref 22–32)
Calcium: 8 mg/dL — ABNORMAL LOW (ref 8.9–10.3)
Creatinine, Ser: 0.79 mg/dL (ref 0.44–1.00)
GFR calc Af Amer: >60 mL/min (ref >60)
GFR calc non Af Amer: >60 mL/min (ref >60)
GLUCOSE: 101 mg/dL — AB (ref 65–99)
Potassium: 3.6 mmol/L (ref 3.5–5.1)
SODIUM: 136 mmol/L (ref 135–145)
Total Bilirubin: 0.6 mg/dL (ref 0.3–1.2)
Total Protein: 6.7 g/dL (ref 6.5–8.1)

## 2017-08-22 LAB — URINALYSIS, MICROSCOPIC (REFLEX)

## 2017-08-22 LAB — LIPASE, BLOOD: Lipase: 50 U/L (ref 11–51)

## 2017-08-22 MED ORDER — ONDANSETRON HCL 4 MG/2ML IJ SOLN
4.0000 mg | Freq: Once | INTRAMUSCULAR | Status: AC
  Administered 2017-08-22: 4 mg via INTRAVENOUS
  Filled 2017-08-22: qty 2

## 2017-08-22 MED ORDER — IOPAMIDOL (ISOVUE-300) INJECTION 61%
100.0000 mL | Freq: Once | INTRAVENOUS | Status: AC | PRN
  Administered 2017-08-22: 100 mL via INTRAVENOUS

## 2017-08-22 MED ORDER — SODIUM CHLORIDE 0.9 % IV BOLUS
1000.0000 mL | Freq: Once | INTRAVENOUS | Status: AC
  Administered 2017-08-22: 1000 mL via INTRAVENOUS

## 2017-08-22 MED ORDER — ONDANSETRON 4 MG PO TBDP: ORAL_TABLET | ORAL | 0 refills | Status: DC

## 2017-08-22 MED ORDER — ACETAMINOPHEN 500 MG PO TABS
1000.0000 mg | ORAL_TABLET | Freq: Once | ORAL | Status: AC
  Administered 2017-08-22: 1000 mg via ORAL
  Filled 2017-08-22: qty 2

## 2017-08-22 MED ORDER — MORPHINE SULFATE (PF) 4 MG/ML IV SOLN
4.0000 mg | Freq: Once | INTRAVENOUS | Status: AC
  Administered 2017-08-22: 4 mg via INTRAVENOUS
  Filled 2017-08-22: qty 1

## 2017-08-22 NOTE — ED Provider Notes (Signed)
[facility] DEPARTMENT Provider Note   CSN: [id number] Arrival date & time: 08/22/17  1153     History   Chief Complaint Chief Complaint  Patient presents with  . Fever    HPI Joanne Mccarty is a 52 y.o. female.  52 yo F with a chief complaint of lower abdominal pain and fever.  This is 2 days post colonoscopy.  Temperature as high as 102.  Pain is worse in the left lower quadrant.  Crampy comes and goes.  Is able to eat and drink but feels somewhat nauseated and eating and drinking less.  Denies sick contacts.  Denies diarrhea.  History of prior bitubal ligation and hysterectomy.    The history is provided by the patient and the spouse.  Fever   Pertinent negatives include no chest pain, no vomiting, no congestion and no headaches.  Abdominal Pain   This is a new problem. The current episode started 2 days ago. The problem occurs constantly. The problem has not changed since onset.The pain is located in the LLQ. The quality of the pain is sharp and shooting. The pain is at a severity of 10/10. The pain is moderate. Associated symptoms include fever and nausea. Pertinent negatives include vomiting, dysuria, headaches, arthralgias and myalgias. Nothing aggravates the symptoms. Nothing relieves the symptoms.    Past Medical History:  Diagnosis Date  . Abdominal bruit    fem neg doppler exam  . Anemia   . Asthma    exercise induced-weather - rarely uses inhaler  . Galactorrhea 06/21/2010  . Headache   . History of chickenpox   . History of hypertension 1998   resolved  . Hx of pyelonephritis 2013   after surgery culture negative   . Hypertension 1998   history of HTN- no meds currently  . Hypothyroidism   . Internal hemorrhoids   . Metatarsal stress fracture 07/22/2011   Based on scan and exam 80% likelihood of stress fracture but appears early   . PONV (postoperative nausea and vomiting)   . Rectal pressure 06/04/2011   while running   . Seasonal  allergies   . SVD (spontaneous vaginal delivery)    x 3    Patient Active Problem List   Diagnosis Date Noted  . Chronic left-sided low back pain without sciatica 01/15/2016  . Hematoma 10/08/2015  . Urinary, incontinence, stress female 10/02/2015  . Postprandial hypoglycemia 10/24/2014  . Piriformis syndrome of right side 07/04/2014  . Ruptured plantar fascia 06/23/2013  . Hemorrhage of rectum and anus 03/10/2012  . Internal hemorrhoids with other complication 37/12/6267  . Incidental pulmonary nodule, > 72mm and < 69mm 01/11/2012  . H/O: hysterectomy 01/11/2012  . Hx of pyelonephritis   . Abdominal bruit   . Rectocele 06/04/2011  . IRON DEFICIENCY 01/02/2009  . TOBACCO USE, QUIT 01/02/2009  . RAYNAUD'S SYNDROME, HX OF 10/09/2008  . CONSTIPATION 07/20/2008  . Insomnia 08/05/2007  . Hypothyroidism 02/04/2007  . ANEMIA-NOS 02/04/2007  . Exercise-induced bronchospasm 02/04/2007  . Other symptoms involving cardiovascular system 02/04/2007    Past Surgical History:  Procedure Laterality Date  . ANTERIOR AND POSTERIOR REPAIR N/A 10/02/2015   Procedure: ANTERIOR (CYSTOCELE) REPAIR;  Surgeon: Delsa Bern, MD;  Location: [facility];  Service: Gynecology;  Laterality: N/A;  . BLADDER SUSPENSION N/A 10/02/2015   Procedure: TRANSVAGINAL TAPE (TVT) PROCEDURE;  Surgeon: Everett Graff, MD;  Location: [facility];  Service: Gynecology;  Laterality: N/A;  . BREAST SURGERY     left  bx  . COLONOSCOPY  03/10/2012   Procedure: COLONOSCOPY;  Surgeon: Lafayette Dragon, MD;  Location: [facility];  Service: Endoscopy;  Laterality: N/A;  . CYSTOSCOPY N/A 10/02/2015   Procedure: CYSTOSCOPY;  Surgeon: Everett Graff, MD;  Location: [facility];  Service: Gynecology;  Laterality: N/A;  . DILATION AND CURETTAGE OF UTERUS  12/2007  . dysplastic lesion      x 3 4/08  . ENDOMETRIAL ABLATION  april 2009  . HEMORRHOID BANDING  03/10/2012   Procedure: HEMORRHOID BANDING;  Surgeon: Lafayette Dragon, MD;  Location: [facility];  Service: Endoscopy;  Laterality: N/A;  . LAPAROSCOPIC LYSIS OF ADHESIONS N/A 05/14/2017   Procedure: LAPAROSCOPIC LYSIS OF ADHESIONS;  Surgeon: Delsa Bern, MD;  Location: [facility];  Service: Gynecology;  Laterality: N/A;  . LAPAROSCOPY N/A 05/14/2017   Procedure: LAPAROSCOPY OPERATIVE;  Surgeon: Delsa Bern, MD;  Location: [facility];  Service: Gynecology;  Laterality: N/A;  . NOVASURE ABLATION    . RECTOCELE REPAIR  09/08/2011   Procedure: POSTERIOR REPAIR (RECTOCELE);  Surgeon: Alwyn Pea, MD;  Location: [facility];  Service: Gynecology;;  . robotic cystectomy  02/2010  . VAGINAL HYSTERECTOMY  09/08/2011   Procedure: HYSTERECTOMY VAGINAL;  Surgeon: Alwyn Pea, MD;  Location: [facility];  Service: Gynecology;  Laterality: N/A;  . WISDOM TOOTH EXTRACTION       OB History    Gravida  3   Para  3   Term  3   Preterm      AB      Living  3     SAB      TAB      Ectopic      Multiple      Live Births               Home Medications    Prior to Admission medications   Medication Sig Start Date End Date Taking? Authorizing Provider  estradiol (CLIMARA - DOSED IN MG/24 HR) 0.05 mg/24hr patch Place 1 patch onto the skin once a week. Sundays   Yes [provider]  ibuprofen (ADVIL,MOTRIN) 600 MG tablet 1 po  pc   every 6 hours for 5 days then as needed for pain 05/14/17  Yes Powell, [place], PA-C  SYNTHROID 100 MCG tablet TAKE 1 TABLET BY MOUTH DAILY BEFORE BREAKFAST. 01/22/17  Yes Panosh, Standley Brooking, MD  albuterol (PROVENTIL HFA;VENTOLIN HFA) 108 (90 BASE) MCG/ACT inhaler Inhale 1-2 puffs into the lungs every 6 (six) hours as needed. For exercise induced asthma symptoms.    [provider]  ALPRAZolam Duanne Moron) 0.5 MG tablet Take 1 tablet (0.5 mg total) by mouth as needed for anxiety (for sedation before MRI scan; take 1 hour before scan; may repeat 15 min before scan). 11/11/15   Penumalli, Earlean Polka, MD  HYDROcodone-acetaminophen (NORCO/VICODIN) 5-325  MG tablet 1 po every 6 hours as needed for post operative pain 05/14/17   Earnstine Regal, PA-C  ondansetron (ZOFRAN ODT) 4 MG disintegrating tablet 4mg  ODT q4 hours prn nausea/vomit 08/22/17   Deno Etienne, DO  rizatriptan (MAXALT-MLT) 10 MG disintegrating tablet Take 1 tablet (10 mg total) by mouth as needed for migraine. May repeat in 2 hours if needed 11/11/15   Penumalli, Earlean Polka, MD    Family History Family History  Problem Relation Age of Onset  . Hypertension Mother   . Melanoma Mother   . Hyperlipidemia Father   . Crohn's disease Brother   . Hypothyroidism Son  2011  . Cancer Maternal Grandfather        liver  . Diabetes Maternal Uncle   . Cancer Paternal Grandmother   . Cancer Paternal Grandfather   . Colon cancer Neg Hx     Social History Social History   Tobacco Use  . Smoking status: Former Smoker    Years: 5.00    Types: Cigarettes    Last attempt to quit: 09/03/1987    Years since quitting: 29.9  . Smokeless tobacco: Never Used  Substance Use Topics  . Alcohol use: Yes    Alcohol/week: 8.4 oz    Types: 14 Glasses of wine per week    Comment: beer/wine daily  . Drug use: No     Allergies   Keflex [cephalexin] and Sulfa antibiotics   Review of Systems Review of Systems  Constitutional: Positive for fever. Negative for chills.  HENT: Negative for congestion and rhinorrhea.   Eyes: Negative for redness and visual disturbance.  Respiratory: Negative for shortness of breath and wheezing.   Cardiovascular: Negative for chest pain and palpitations.  Gastrointestinal: Positive for abdominal pain and nausea. Negative for vomiting.  Genitourinary: Negative for dysuria and urgency.  Musculoskeletal: Negative for arthralgias and myalgias.  Skin: Negative for pallor and wound.  Neurological: Negative for dizziness and headaches.     Physical Exam Updated Vital Signs BP 105/66 (BP Location: Right Arm)   Pulse 64   Temp 98.3 F (36.8 C) (Oral)   Resp 18    Ht 5\' 6"  (1.676 m)   Wt 58.1 kg (128 lb)   LMP 08/13/2011   SpO2 95%   BMI 20.66 kg/m   Physical Exam  Constitutional: She is oriented to person, place, and time. She appears well-developed and well-nourished. No distress.  HENT:  Head: Normocephalic and atraumatic.  Eyes: Pupils are equal, round, and reactive to light. EOM are normal.  Neck: Normal range of motion. Neck supple.  Cardiovascular: Normal rate and regular rhythm. Exam reveals no gallop and no friction rub.  No murmur heard. Pulmonary/Chest: Effort normal. She has no wheezes. She has no rales.  Abdominal: Soft. She exhibits no distension and no mass. There is no tenderness. There is no guarding.  Benign abdominal exam  Musculoskeletal: She exhibits no edema or tenderness.  Neurological: She is alert and oriented to person, place, and time.  Skin: Skin is warm and dry. She is not diaphoretic.  Psychiatric: She has a normal mood and affect. Her behavior is normal.  Nursing note and vitals reviewed.    ED Treatments / Results  Labs (all labs ordered are listed, but only abnormal results are displayed) Labs Reviewed  CBC WITH DIFFERENTIAL/PLATELET - Abnormal; Notable for the following components:      Result Value   WBC 2.4 (*)    Lymphs Abs 0.3 (*)    All other components within normal limits  COMPREHENSIVE METABOLIC PANEL - Abnormal; Notable for the following components:   Glucose, Bld 101 (*)    Calcium 8.0 (*)    AST 741 (*)    ALT 437 (*)    All other components within normal limits  URINALYSIS, ROUTINE W REFLEX MICROSCOPIC - Abnormal; Notable for the following components:   Bilirubin Urine SMALL (*)    Ketones, ur 15 (*)    Protein, ur 30 (*)    All other components within normal limits  URINALYSIS, MICROSCOPIC (REFLEX) - Abnormal; Notable for the following components:   Bacteria, UA FEW (*)  All other components within normal limits  LIPASE, BLOOD    EKG None  Radiology Ct Abdomen Pelvis W  Contrast  Result Date: 08/22/2017 CLINICAL DATA:  Fever and abdominal pain post colonoscopy 2 days ago EXAM: CT ABDOMEN AND PELVIS WITH CONTRAST TECHNIQUE: Multidetector CT imaging of the abdomen and pelvis was performed using the standard protocol following bolus administration of intravenous contrast. CONTRAST:  124mL ISOVUE-300 IOPAMIDOL (ISOVUE-300) INJECTION 61% COMPARISON:  01/06/2016 and previous FINDINGS: Lower chest: No acute abnormality. Hepatobiliary: No focal liver lesion or biliary ductal dilatation. Mild periportal edema, nonspecific. The gallbladder is nondistended. Pancreas: Unremarkable. No pancreatic ductal dilatation or surrounding inflammatory changes. Spleen: Normal in size without focal abnormality. Adrenals/Urinary Tract: Normal adrenal glands. No hydronephrosis. Stable subcentimeter probable cyst, lower pole right kidney. No worrisome renal lesion. Urinary bladder is incompletely distended. Stomach/Bowel: Stomach is nondistended. Small bowel decompressed. Normal appendix. The colon is nondilated, unremarkable. Vascular/Lymphatic: No atheromatous change. Portal vein patent. No abdominal or pelvic adenopathy. Reproductive: Status post hysterectomy. 2.2 cm hemorrhagic right ovarian cyst. No adnexal mass. Other: Small amount of pelvic ascites without loculation or enhancement. No free air. Musculoskeletal: Residual 2.8 x 1.1 cm soft tissue thickening along the anteromedial aspect of the left external iliac vessels probably residual from previous large pelvic wall hematoma. Benign bone islands in the left iliac bone, stable since 09/25/2011. Negative for fracture or worrisome bone lesion. IMPRESSION: 1. No acute findings.  No evidence of perforation or abscess. 2. Small amount of pelvic ascites, which can be physiologic in ovulating females. 3. Residual soft tissue thickening along the left pelvic sidewall, site of previous large hematoma. Electronically Signed   By: Lucrezia Europe M.D.   On:  08/22/2017 14:06    Procedures Procedures (including critical care time)  Medications Ordered in ED Medications  sodium chloride 0.9 % bolus 1,000 mL (0 mLs Intravenous Stopped 08/22/17 1338)  morphine 4 MG/ML injection 4 mg (4 mg Intravenous Given 08/22/17 1249)  ondansetron (ZOFRAN) injection 4 mg (4 mg Intravenous Given 08/22/17 1249)  acetaminophen (TYLENOL) tablet 1,000 mg (1,000 mg Oral Given 08/22/17 1246)  iopamidol (ISOVUE-300) 61 % injection 100 mL (100 mLs Intravenous Contrast Given 08/22/17 1339)     Initial Impression / Assessment and Plan / ED Course  I have reviewed the triage vital signs and the nursing notes.  Pertinent labs & imaging results that were available during my care of the patient were reviewed by me and considered in my medical decision making (see chart for details).     52 yo F with a chief complaint of lower abdominal pain nausea post colonoscopy.  Colonoscopy was couple days ago.  Her pain is been getting worse.  Having fevers as high as 102.  Benign abdominal exam.  Work-up with elevated LFTs leukopenia.  CT scan of the abdomen pelvis without perforation.  She has a small amount of pelvic free fluid that may be normal in an ovulating female.  Feeling much better on reassessment.  I discussed the results with the patient and family.  They will follow-up with her GI doctor and PCP.  Return for any worsening.  3:09 PM:  I have discussed the diagnosis/risks/treatment options with the patient and family and believe the pt to be eligible for discharge home to follow-up with PCP, GI. We also discussed returning to the ED immediately if new or worsening sx occur. We discussed the sx which are most concerning (e.g., sudden worsening pain, fever, inability to tolerate by mouth) that  necessitate immediate return. Medications administered to the patient during their visit and any new prescriptions provided to the patient are listed below.  Medications given during this  visit Medications  sodium chloride 0.9 % bolus 1,000 mL (0 mLs Intravenous Stopped 08/22/17 1338)  morphine 4 MG/ML injection 4 mg (4 mg Intravenous Given 08/22/17 1249)  ondansetron (ZOFRAN) injection 4 mg (4 mg Intravenous Given 08/22/17 1249)  acetaminophen (TYLENOL) tablet 1,000 mg (1,000 mg Oral Given 08/22/17 1246)  iopamidol (ISOVUE-300) 61 % injection 100 mL (100 mLs Intravenous Contrast Given 08/22/17 1339)     The patient appears reasonably screen and/or stabilized for discharge and I doubt any other medical condition or other [facility] requiring further screening, evaluation, or treatment in the ED at this time prior to discharge.    Final Clinical Impressions(s) / ED Diagnoses   Final diagnoses:  Left lower quadrant pain    ED Discharge Orders        Ordered    ondansetron (ZOFRAN ODT) 4 MG disintegrating tablet     08/22/17 Baker, Holt, DO 08/22/17 1511

## 2017-08-22 NOTE — ED Triage Notes (Signed)
Pt reports she had a colonoscopy 2 days ago, now has fever and pelvic pain. Took ibuprofen at 6 am.

## 2017-08-22 NOTE — Discharge Instructions (Signed)
Follow up with your GI doc and PCP.  Return for worsening.

## 2017-08-31 ENCOUNTER — Inpatient Hospital Stay (HOSPITAL_COMMUNITY)
Admission: AD | Admit: 2017-08-31 | Discharge: 2017-09-02 | DRG: 443 | Disposition: A | Discharge status: Home / Self Care | Payer: PRIVATE HEALTH INSURANCE | Attending: Gastroenterology | Admitting: Gastroenterology

## 2017-08-31 ENCOUNTER — Encounter (HOSPITAL_COMMUNITY): Payer: Self-pay | Admitting: *Deleted

## 2017-08-31 DIAGNOSIS — Z90710 Acquired absence of both cervix and uterus: Secondary | ICD-10-CM

## 2017-08-31 DIAGNOSIS — Z888 Allergy status to other drugs, medicaments and biological substances status: Secondary | ICD-10-CM

## 2017-08-31 DIAGNOSIS — Z8619 Personal history of other infectious and parasitic diseases: Secondary | ICD-10-CM

## 2017-08-31 DIAGNOSIS — Z8349 Family history of other endocrine, nutritional and metabolic diseases: Secondary | ICD-10-CM | POA: Diagnosis not present

## 2017-08-31 DIAGNOSIS — B159 Hepatitis A without hepatic coma: Secondary | ICD-10-CM | POA: Diagnosis present

## 2017-08-31 DIAGNOSIS — Z87891 Personal history of nicotine dependence: Secondary | ICD-10-CM

## 2017-08-31 DIAGNOSIS — Z8249 Family history of ischemic heart disease and other diseases of the circulatory system: Secondary | ICD-10-CM

## 2017-08-31 DIAGNOSIS — J45909 Unspecified asthma, uncomplicated: Secondary | ICD-10-CM | POA: Diagnosis present

## 2017-08-31 DIAGNOSIS — Z833 Family history of diabetes mellitus: Secondary | ICD-10-CM | POA: Diagnosis not present

## 2017-08-31 DIAGNOSIS — Z808 Family history of malignant neoplasm of other organs or systems: Secondary | ICD-10-CM

## 2017-08-31 DIAGNOSIS — I10 Essential (primary) hypertension: Secondary | ICD-10-CM | POA: Diagnosis present

## 2017-08-31 DIAGNOSIS — E039 Hypothyroidism, unspecified: Secondary | ICD-10-CM | POA: Diagnosis present

## 2017-08-31 LAB — PROTIME-INR
INR: 1.16
PROTHROMBIN TIME: 14.8 s (ref 11.4–15.2)

## 2017-08-31 MED ORDER — LEVOTHYROXINE SODIUM 100 MCG PO TABS
100.0000 ug | ORAL_TABLET | Freq: Every day | ORAL | Status: DC
  Administered 2017-09-01 – 2017-09-02 (×2): 100 ug via ORAL
  Filled 2017-08-31 (×2): qty 1

## 2017-08-31 MED ORDER — ZOLPIDEM TARTRATE 5 MG PO TABS
5.0000 mg | ORAL_TABLET | Freq: Every evening | ORAL | Status: DC | PRN
  Administered 2017-08-31 – 2017-09-01 (×2): 5 mg via ORAL
  Filled 2017-08-31 (×2): qty 1

## 2017-08-31 MED ORDER — ENSURE ENLIVE PO LIQD
237.0000 mL | Freq: Two times a day (BID) | ORAL | Status: DC
  Administered 2017-09-01: 237 mL via ORAL

## 2017-08-31 MED ORDER — LACTATED RINGERS IV SOLN
INTRAVENOUS | Status: DC
  Administered 2017-08-31 – 2017-09-01 (×2): via INTRAVENOUS

## 2017-08-31 NOTE — Progress Notes (Signed)
Pt requesting something to help her sleep. Dr. Benson Norway made aware. Order given for [employer]. Hale Bogus.

## 2017-08-31 NOTE — H&P (Signed)
Joanne Mccarty HPI: This is a 52 year old female with a PMH of hypothyroidism admitted for acute hepatitis A infection.  She underwent a colonoscopy with Dr. Collene Mares on 08/21/2017 and on 08/22/2017 she presented to [facility].  She was complaining about a fever at 102 F and LLQ abdominal pain.  Work up was negative for any perforation with a CT scan.  Her liver enzymes were the only notable abnormalities:  AST 741, ALT 437, AP 109, and TB 0.6.  She followed with Dr. Collene Mares on 08/25/2017, and additional blood work was obtained.  Her liver enzymes did increase:  AST 741, ALT 697, AP 198, and TB 0.95.  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  Over this past month she has traveled with her children for soccer and lacrosse games.  She frequently eats out.  Her children are immunized and her husband believes that he has immunity.  Over the course of the interval time period she reports having fatigue, continued fevers, and abdominal pain in the lower abdomen.  Typically she is very active.  She continued to feel unwell and repeat blood work from the office was obtained:  AST 3709, ALT 3251, AP 335, TB 6.8.  As a result of the worsening liver panel she was advised to be admitted to the hospital. Past Medical History:  Diagnosis Date  . Abdominal bruit    fem neg doppler exam  . Anemia   . Asthma    exercise induced-weather - rarely uses inhaler  . Galactorrhea 06/21/2010  . Headache   . History of chickenpox   . History of hypertension 1998   resolved  . Hx of pyelonephritis 2013   after surgery culture negative   . Hypertension 1998   history of HTN- no meds currently  . Hypothyroidism   . Internal hemorrhoids   . Metatarsal stress fracture 07/22/2011   Based on scan and exam 80% likelihood of stress fracture but appears early   . PONV (postoperative nausea and vomiting)   . Rectal pressure 06/04/2011   while running   . Seasonal allergies   . SVD (spontaneous  vaginal delivery)    x 3    Past Surgical History:  Procedure Laterality Date  . ANTERIOR AND POSTERIOR REPAIR N/A 10/02/2015   Procedure: ANTERIOR (CYSTOCELE) REPAIR;  Surgeon: Delsa Bern, MD;  Location: [facility];  Service: Gynecology;  Laterality: N/A;  . BLADDER SUSPENSION N/A 10/02/2015   Procedure: TRANSVAGINAL TAPE (TVT) PROCEDURE;  Surgeon: Everett Graff, MD;  Location: [facility];  Service: Gynecology;  Laterality: N/A;  . BREAST SURGERY     left bx  . COLONOSCOPY  03/10/2012   Procedure: COLONOSCOPY;  Surgeon: Lafayette Dragon, MD;  [facility];  Service: Endoscopy;  Laterality: N/A;  . CYSTOSCOPY N/A 10/02/2015   Procedure: CYSTOSCOPY;  Surgeon: Everett Graff, MD;  Location: [facility];  Service: Gynecology;  Laterality: N/A;  . DILATION AND CURETTAGE OF UTERUS  12/2007  . dysplastic lesion      x 3 4/08  . ENDOMETRIAL ABLATION  april 2009  . HEMORRHOID BANDING  03/10/2012   Procedure: HEMORRHOID BANDING;  Surgeon: Lafayette Dragon, MD;  [facility];  Service: Endoscopy;  Laterality: N/A;  . LAPAROSCOPIC LYSIS OF ADHESIONS N/A 05/14/2017   Procedure: LAPAROSCOPIC LYSIS OF ADHESIONS;  Surgeon: Delsa Bern, MD;  Location: [facility];  Service: Gynecology;  Laterality: N/A;  . LAPAROSCOPY N/A 05/14/2017  Procedure: LAPAROSCOPY OPERATIVE;  Surgeon: Delsa Bern, MD;  Location: [facility];  Service: Gynecology;  Laterality: N/A;  . NOVASURE ABLATION    . RECTOCELE REPAIR  09/08/2011   Procedure: POSTERIOR REPAIR (RECTOCELE);  Surgeon: Alwyn Pea, MD;  Location: [facility];  Service: Gynecology;;  . robotic cystectomy  02/2010  . VAGINAL HYSTERECTOMY  09/08/2011   Procedure: HYSTERECTOMY VAGINAL;  Surgeon: Alwyn Pea, MD;  Location: [facility];  Service: Gynecology;  Laterality: N/A;  . WISDOM TOOTH EXTRACTION      Family History  Problem Relation Age of Onset  . Hypertension Mother   . Melanoma Mother   . Hyperlipidemia Father   . Crohn's disease Brother   .  Hypothyroidism Son        2011  . Cancer Maternal Grandfather        liver  . Diabetes Maternal Uncle   . Cancer Paternal Grandmother   . Cancer Paternal Grandfather   . Colon cancer Neg Hx     Social History:  reports that she quit smoking about 30 years ago. Her smoking use included cigarettes. She quit after 5.00 years of use. She has never used smokeless tobacco. She reports that she drinks about 8.4 oz of alcohol per week. She reports that she does not use drugs.  Allergies:  Allergies  Allergen Reactions  . Keflex [Cephalexin] Rash    Medications:  Scheduled: . [START ON 09/01/2017] levothyroxine  100 mcg Oral QAC breakfast   Continuous: . lactated ringers      No results found for this or any previous visit (from the past 24 hour(s)).   No results found.  ROS:  As stated above in the HPI otherwise negative.  Last menstrual period 08/13/2011.    PE: Gen: NAD, Alert and Oriented HEENT:  [place]/AT, EOMI ABM: Soft, mildly tender in the RUQ, no hepatosplenomegaly, +BS Ext: No C/C/E, no asterixis  Assessment/Plan: 1) Acute HAV infection. 2) Abnormal liver enzymes.   Most likely she contracted the virus during one of the times that she ate out.  The incubation period can be up to as long as 50 days, but I suspect she contracted the virus sometime earlier in the month.  There is no evidence of hepatic encephalopathy and a baseline INR will be obtained.  It is unlikely that she will develop fulminant liver failure.  Hopefully her liver enzymes will peak in the next day or two.  The TB will peak later.  Unfortunately there is no specific treatment and supportive care will be provided for her.  Plan: 1) IV hydration with LR. 2) Continue with levothyroxine. 3) Follow liver panel. 4) Continue to monitor for any evidence of hepatic encephalopathy.  Joanne Mccarty D 08/31/2017, 7:34 PM

## 2017-09-01 ENCOUNTER — Encounter: Payer: Self-pay | Admitting: Internal Medicine

## 2017-09-01 ENCOUNTER — Other Ambulatory Visit: Payer: Self-pay

## 2017-09-01 LAB — CBC
HEMATOCRIT: 37.1 % (ref 36.0–46.0)
HEMOGLOBIN: 12.6 g/dL (ref 12.0–15.0)
MCH: 31.3 pg (ref 26.0–34.0)
MCHC: 34 g/dL (ref 30.0–36.0)
MCV: 92.3 fL (ref 78.0–100.0)
Platelets: 362 10*3/uL (ref 150–400)
RBC: 4.02 MIL/uL (ref 3.87–5.11)
RDW: 13.8 % (ref 11.5–15.5)
WBC: 5.3 10*3/uL (ref 4.0–10.5)

## 2017-09-01 LAB — COMPREHENSIVE METABOLIC PANEL
ALBUMIN: 2.7 g/dL — AB (ref 3.5–5.0)
ALT: 3180 U/L — ABNORMAL HIGH (ref 14–54)
AST: 3263 U/L — ABNORMAL HIGH (ref 15–41)
Alkaline Phosphatase: 261 U/L — ABNORMAL HIGH (ref 38–126)
Anion gap: 8 (ref 5–15)
BILIRUBIN TOTAL: 8.1 mg/dL — AB (ref 0.3–1.2)
BUN: 7 mg/dL (ref 6–20)
CHLORIDE: 99 mmol/L — AB (ref 101–111)
CO2: 29 mmol/L (ref 22–32)
Calcium: 8.2 mg/dL — ABNORMAL LOW (ref 8.9–10.3)
Creatinine, Ser: 0.59 mg/dL (ref 0.44–1.00)
GFR calc Af Amer: >60 mL/min (ref >60)
GFR calc non Af Amer: >60 mL/min (ref >60)
GLUCOSE: 82 mg/dL (ref 65–99)
POTASSIUM: 4.2 mmol/L (ref 3.5–5.1)
Sodium: 136 mmol/L (ref 135–145)
TOTAL PROTEIN: 6.3 g/dL — AB (ref 6.5–8.1)

## 2017-09-01 MED ORDER — SODIUM CHLORIDE 0.9 % IV BOLUS
1000.0000 mL | Freq: Once | INTRAVENOUS | Status: DC
  Administered 2017-09-01: 1000 mL via INTRAVENOUS

## 2017-09-01 MED ORDER — IBUPROFEN 800 MG PO TABS
800.0000 mg | ORAL_TABLET | Freq: Two times a day (BID) | ORAL | Status: DC | PRN
  Administered 2017-09-01 (×2): 800 mg via ORAL
  Filled 2017-09-01 (×2): qty 1

## 2017-09-01 MED ORDER — PREMIER PROTEIN SHAKE
11.0000 [oz_av] | Freq: Two times a day (BID) | ORAL | Status: DC
  Administered 2017-09-01 (×2): 11 [oz_av] via ORAL
  Filled 2017-09-01 (×4): qty 325.31

## 2017-09-01 MED ORDER — LACTATED RINGERS IV BOLUS
1000.0000 mL | Freq: Once | INTRAVENOUS | Status: AC
  Administered 2017-09-01: 1000 mL via INTRAVENOUS

## 2017-09-01 NOTE — Progress Notes (Signed)
Subjective: Since I last evaluated the patient, she seems to be doing much better. She still has some RUQ tenderness and mild nausea. The nausea is much better than it was yesterday. No evidence of any bleeding or mental status changes.  Objective: Vital signs in last 24 hours: Temp:  [98.4 F (36.9 C)-100 F (37.8 C)] 98.4 F (36.9 C) (06/05 1425) Pulse Rate:  [59-73] 59 (06/05 1425) Resp:  [18-22] 22 (06/05 1425) BP: (102-111)/(64-66) 102/66 (06/05 1425) SpO2:  [93 %-97 %] 97 % (06/05 1425) Weight:  [58.1 kg (128 lb)-60.2 kg (132 lb 11.5 oz)] 60.2 kg (132 lb 11.5 oz) (06/05 1350) Last BM Date: 08/31/17  Intake/Output from previous day: 06/04 0701 - 06/05 0700 In: 755 [I.V.:755] Out: -  Intake/Output this shift: Total I/O In: 720 [I.V.:720] Out: -   General appearance: alert, cooperative, appears stated age, fatigued, icteric and no distress; she appears to be more alert and interactive today as compared to yesterday Resp: clear to auscultation bilaterally Cardio: regular rate and rhythm, S1, S2 normal, no murmur, click, rub or gallop GI: soft with RUQ tenderness on palpation with no gaurding, rebound or rigidity; bowel sounds normal; no masses,  no organomegaly Extremities: extremities normal, atraumatic, no cyanosis or edema  Lab Results: Recent Labs    09/01/17 0600  WBC 5.3  HGB 12.6  HCT 37.1  PLT 362   BMET Recent Labs    09/01/17 0600  NA 136  K 4.2  CL 99*  CO2 29  GLUCOSE 82  BUN 7  CREATININE 0.59  CALCIUM 8.2*   LFT Recent Labs    09/01/17 0600  PROT 6.3*  ALBUMIN 2.7*  AST 3,263*  ALT 3,180*  ALKPHOS 261*  BILITOT 8.1*   PT/INR Recent Labs    08/31/17 2030  LABPROT 14.8  INR 1.16   Medications: I have reviewed the patient's current medications  Assessment/Plan: 1) Acute HAV infection/jaundice-will give 2 liters of LR tonite. Will recheck LFT's in AM along with an INR. Continue supportive care for now. Anticipate discharge in AM if  LFT's continue to improve.  LOS: 1 day   Jennifermarie Franzen 09/01/2017, 6:08 PM

## 2017-09-01 NOTE — Progress Notes (Signed)
Initial Nutrition Assessment  DOCUMENTATION CODES:   Not applicable  INTERVENTION:   Provide Premier Protein BID, each supplement provides 160kcal and 30g protein.   NUTRITION DIAGNOSIS:   Inadequate oral intake related to acute illness as evidenced by per patient/family report.  GOAL:   Patient will meet greater than or equal to 90% of their needs  MONITOR:   PO intake, Supplement acceptance, Weight trends, Labs  REASON FOR ASSESSMENT:   Malnutrition Screening Tool    ASSESSMENT:   Patient with PMH significant for HTN, anemia, and hypothyroidism. Presents this admission with acute Hepatitis A infection.    Pt reports having decreased appetite for 3 weeks PTA. States during this time period she would eat very little at meal times. Prior to this, pt's regular intake consisted of three meals each day with snacks. She is not a very big meat eater, but will often have at least one piece each evening for dinner. She does not use supplementation at home. She runs 15-20 miles each week. Discussed the importance of protein intake for preservation of lean body mass while in the hospital and if appetite remains poor. Provided at home supplement suggestions. Pt tried Ensure this morning but does not enjoy the sweet taste. RD to change to Premier Protein for better acceptance.   Pt endorses a UBW of 130-135 lb and a recent wt loss of 6 lbs. Records indicate pt has maintained her weight of 128-135 lb since October 2018. The most recent weight is stated. Will need to obtain actual weight reading to determine actual weight loss. Nutrition-Focused physical exam completed.   Medications reviewed and include: LR @ 100 ml/hr Labs reviewed: AST 3,263 (H) ALT 3,180 (H)  NUTRITION - FOCUSED PHYSICAL EXAM:    Most Recent Value  Orbital Region  No depletion  Upper Arm Region  Mild depletion  Thoracic and Lumbar Region  No depletion  Buccal Region  No depletion  Temple Region  Mild depletion   Clavicle Bone Region  Moderate depletion  Clavicle and Acromion Bone Region  Moderate depletion  Scapular Bone Region  Mild depletion  Dorsal Hand  No depletion  Patellar Region  Mild depletion  Anterior Thigh Region  Mild depletion  Posterior Calf Region  Mild depletion  Edema (RD Assessment)  None  Hair  Reviewed  Eyes  Reviewed  Mouth  Reviewed  Skin  Reviewed  Nails  Reviewed     Diet Order:   Diet Order           Diet regular Room service appropriate? Yes; Fluid consistency: Thin  Diet effective now          EDUCATION NEEDS:   Education needs have been addressed  Skin:  Skin Assessment: Reviewed RN Assessment  Last BM:  08/31/17  Height:   Ht Readings from Last 1 Encounters:  09/01/17 5' 5.98" (1.676 m)    Weight:   Wt Readings from Last 1 Encounters:  09/01/17 128 lb (58.1 kg)    Ideal Body Weight:  59.1 kg  BMI:  Body mass index is 20.67 kg/m.  Estimated Nutritional Needs:   Kcal:  1500-1700 kcal  Protein:  75-85 g  Fluid:  >1.5 L/day    Mariana Single RD, LDN Clinical Nutrition Pager # [phone]

## 2017-09-02 LAB — COMPREHENSIVE METABOLIC PANEL
ALK PHOS: 192 U/L — AB (ref 38–126)
ALT: 2225 U/L — AB (ref 14–54)
AST: 1586 U/L — ABNORMAL HIGH (ref 15–41)
Albumin: 2.3 g/dL — ABNORMAL LOW (ref 3.5–5.0)
Anion gap: 4 — ABNORMAL LOW (ref 5–15)
BILIRUBIN TOTAL: 7.7 mg/dL — AB (ref 0.3–1.2)
BUN: 8 mg/dL (ref 6–20)
CALCIUM: 8.3 mg/dL — AB (ref 8.9–10.3)
CO2: 31 mmol/L (ref 22–32)
CREATININE: 0.59 mg/dL (ref 0.44–1.00)
Chloride: 105 mmol/L (ref 101–111)
GFR calc Af Amer: >60 mL/min (ref >60)
GFR calc non Af Amer: >60 mL/min (ref >60)
GLUCOSE: 87 mg/dL (ref 65–99)
Potassium: 4.2 mmol/L (ref 3.5–5.1)
SODIUM: 140 mmol/L (ref 135–145)
TOTAL PROTEIN: 5.5 g/dL — AB (ref 6.5–8.1)

## 2017-09-02 LAB — PROTIME-INR
INR: 1.27
Prothrombin Time: 15.8 seconds — ABNORMAL HIGH (ref 11.4–15.2)

## 2017-09-02 NOTE — Progress Notes (Signed)
Patient awoke at 0315 from a night sweat where she was drenched head to toe, full bed linen & clothing change needed. Pt is asymptomatic, no fever, pain or chills.

## 2017-09-02 NOTE — Discharge Summary (Signed)
Physician Discharge Summary  Patient ID: Joanne Mccarty MRN: [id number] DOB/AGE: 08/30/1965 52 y.o.  Admit date: 08/31/2017 Discharge date: 09/02/2017  Admission Diagnoses:  Acute Hepatitis A  Discharge Diagnoses: Same Active Problems:   Hepatitis A virus infection   Discharged Condition: good  Hospital Course: The patient was admitted with an acute HAV infection.  She was not feeling well at home and she felt weaker.  Blood work in the office showed that her values were in the 3,000 range for her AST and ALT.  IV hydration was provided with LR and she was evaluate for any evidence of acute hepatic failure.  Her INR was normal and there was no evidence of hepatic encephalopathy.  She was feeling better during the hospitalization, but she was still weak.  Her liver enzymes peaked in the 3,000 range and on the day of D/C it was clear that her liver enzymes were declining.  She was able to tolerate PO, but she did not eat a significant amount.  She will follow up with Dr. Collene Mares early next week to ensure continued improvement.  Consults: None  Significant Diagnostic Studies: labs: CMP, PT/INR, and CBC  Treatments: IV hydration  Discharge Exam: Blood pressure 107/62, pulse (!) 53, temperature 97.9 F (36.6 C), temperature source Oral, resp. rate 16, height 5' 5.98" (1.676 m), weight 58.2 kg (128 lb 6.4 oz), last menstrual period 08/13/2011, SpO2 100 %. General appearance: alert, fatigued and no distress GI: Mild RUQ tenderness Extremities: extremities normal, atraumatic, no cyanosis or edema and no asterixis  Disposition: Discharge disposition: 01-Home or Self Care        Allergies as of 09/02/2017      Reactions   Keflex [cephalexin] Rash      Medication List    STOP taking these medications   HYDROcodone-acetaminophen 5-325 MG tablet Commonly known as:  NORCO/VICODIN   ibuprofen 200 MG tablet Commonly known as:  ADVIL,MOTRIN   ibuprofen 600 MG tablet Commonly known as:   ADVIL,MOTRIN     TAKE these medications   docusate sodium 100 MG capsule Commonly known as:  COLACE Take 100 mg by mouth daily as needed for mild constipation.   estradiol 0.05 mg/24hr patch Commonly known as:  CLIMARA - Dosed in mg/24 hr Place 1 patch onto the skin once a week. Sundays   ondansetron 4 MG disintegrating tablet Commonly known as:  ZOFRAN ODT 4mg  ODT q4 hours prn nausea/vomit What changed:    how much to take  how to take this  when to take this  reasons to take this  additional instructions   rizatriptan 10 MG disintegrating tablet Commonly known as:  MAXALT-MLT Take 1 tablet (10 mg total) by mouth as needed for migraine. May repeat in 2 hours if needed   SYNTHROID 100 MCG tablet Generic drug:  levothyroxine TAKE 1 TABLET BY MOUTH DAILY BEFORE BREAKFAST.   zolpidem 5 MG tablet Commonly known as:  AMBIEN Take 5 mg by mouth at bedtime as needed for sleep.        SignedCarol Ada D 09/02/2017, 8:50 AM

## 2017-12-29 ENCOUNTER — Ambulatory Visit (INDEPENDENT_AMBULATORY_CARE_PROVIDER_SITE_OTHER): Payer: PRIVATE HEALTH INSURANCE | Admitting: Sports Medicine

## 2017-12-29 ENCOUNTER — Encounter

## 2017-12-29 ENCOUNTER — Encounter: Payer: Self-pay | Admitting: Sports Medicine

## 2017-12-29 VITALS — BP 100/64 | Ht 66.0 in | Wt 121.0 lb

## 2017-12-29 DIAGNOSIS — M84375A Stress fracture, left foot, initial encounter for fracture: Secondary | ICD-10-CM | POA: Diagnosis not present

## 2017-12-29 NOTE — Progress Notes (Signed)
   HPI  CC: Left foot pain Joanne Mccarty is a 52 year old female who presents for left foot pain.  She states the pain is been present for around 2 to 3 weeks.  She states she has been running around 25 miles a week during that time.  She is also been playing tennis 3 to 4 days a week.  She states the pain is worse when she is running.  She states the pain is also worse when she is playing tennis.  She denies any numbness and tingling in her foot.  She denies any weakness of the foot.  She does not remember any inciting event.  She has no trauma to the area.  She has been taken ibuprofen for pain relief with some relief.  She has been wearing a long boot for the past week.  She states that this did give her some relief in her foot.  She also has not ran in the last week.  See HPI and/or previous note for associated ROS.  Objective: BP 100/64   Ht 5\' 6"  (1.676 m)   Wt 121 lb (54.9 kg)   LMP 08/13/2011   BMI 19.53 kg/m  Gen:  NAD, well groomed, a/o x3, normal affect.  CV: Well-perfused. Warm.  Resp: Non-labored.  Neuro: Sensation intact throughout. No gross coordination deficits.   Left foot exam: No erythema, warmth, swelling.  Tenderness palpation along the distal second metatarsal head.  No tenderness noted between the second and third webspace.  Full range of motion in plantarflexion, dorsiflexion, eversion, inversion of the foot.  No pain with range of motion.  Strength 5 out of 5 throughout all testing.  Negative anterior drawer test.  ULTRASOUND: Left foot Diagnostic limited ultrasound imaging obtained of patient's foot.  - Second metatarsal: Edema noted around the distal 2nd metatarsal head.  No break in cortex of bone.  No calcification noted.  IMPRESSION: findings consistent with stress reaction of second metatarsal.   Assessment and plan: Stress reaction second metatarsal  Discussed treatment options with Lyndon at today's visit.  We discussed that we need to put her in a boot for the  next 2 to 4 weeks.  She currently has a long boot, and we discussed that she could go to a short boot versus a surgical shoe at this time.  She does elect to try the short boot instead.  She has surgery in 2 weeks for her pelvis, and we will see her when she is recovered from that surgery.  She can remain in the walking boot until that time.  We could consider switching to a surgical shoe at her follow-up for an additional week or 2.   Lewanda Rife, MD [facility] 12/29/2017 9:40 AM

## 2017-12-29 NOTE — Patient Instructions (Signed)
Thank you for coming to see [place] today in clinic.  You were seen today for left foot pain.  We have diagnosed you with a stress fracture of the second metatarsal.  We will place you in a short walking boot at this time.  We will follow-up after your surgery in 2 weeks.  At that time we can likely bring you out of the walking boot and switch you to a surgical shoe.  No strenuous running in the interim.

## 2018-01-15 ENCOUNTER — Other Ambulatory Visit: Payer: Self-pay | Admitting: Diagnostic Neuroimaging

## 2018-01-26 ENCOUNTER — Ambulatory Visit (INDEPENDENT_AMBULATORY_CARE_PROVIDER_SITE_OTHER): Payer: PRIVATE HEALTH INSURANCE | Admitting: Sports Medicine

## 2018-01-26 VITALS — BP 110/69 | Ht 66.0 in | Wt 120.0 lb

## 2018-01-26 DIAGNOSIS — M7742 Metatarsalgia, left foot: Secondary | ICD-10-CM | POA: Diagnosis not present

## 2018-01-26 NOTE — Patient Instructions (Signed)
Thank you for coming to see Joanne Mccarty today in clinic.  You were seen today for left foot pain.  On her ultrasound scan today, we saw a small bone spur on the bottom of your second toe.  We fitted you for some inserts today with a pad on the left insert to help offload that area.  We will see back for follow-up in 6 weeks to reassess.

## 2018-01-26 NOTE — Progress Notes (Addendum)
   HPI  CC: Left foot pain  Joanne Mccarty is a 52 year old female returns for follow-up of left foot pain.  She states she has had a difficult time discerning if the pain is still present or not.  This is because she underwent surgery 2 weeks ago for pelvis issues.  She states that she had some numbness on the lateral side of her leg since that time.  He states that maybe is getting somewhat better.  She has seen her surgeon about this previously.  She has not noticed any motor loss in her foot.  She does state she does notice pain on the plantar surface of her second metatarsal bone.  She has not been wearing the boot she was given last appointment for the last 2 weeks.  She states is because she was postoperative from her surgery.  She does not recall any new trauma to the area.  She has not been running.  She does not member any new inciting event.  See HPI and/or previous note for associated ROS.  Objective: BP 110/69   Ht 5\' 6"  (1.676 m)   Wt 120 lb (54.4 kg)   LMP 08/13/2011   BMI 19.37 kg/m  Gen: Right-Hand Dominant. NAD, well groomed, a/o x3, normal affect.  CV: Well-perfused. Warm.  Resp: Non-labored.  Neuro: Sensation intact throughout. No gross coordination deficits.  Gait: Nonpathologic posture, unremarkable stride without signs of limp or balance issues.  Left foot exam: No warmth, swelling, erythema noted.  Tenderness palpation over the plantar surface of the second metatarsal head.  Numbness over the dorsum of the left foot.  Full range of motion in plantarflexion, dorsiflexion, eversion, inversion.  Strength 5 out of 5 throughout testing.  Negative anterior drawer.  ULTRASOUND: Left foot Diagnostic limited ultrasound imaging obtained of patient's left foot Second metatarsal: No edema noted around second metatarsal head today.  No break in cortex noted.  No calcifications noted in the tendons.  On the plantar aspect, there is a spur noted at the MTP joint. - IMPRESSION: findings  consistent with degenerative change of the second MTP joint.  Assessment and plan: 1. Left foot pain, secondary to metatarsalgia.  Bone spur seen at the MTP joint of the second toe on ultrasound. 2.  Lateral numbness of the left leg.  This is likely secondary to compression during her surgery she had 2 weeks ago.  We discussed treatment options of lower today's visit.  At this time we will proceed with inserts with a metatarsal pad.  If this does not help relieve her pain, the next option would be consider doing MTP injection at that site.  We will see her for follow-up in 4 weeks.  For the weakness in her leg, this is likely secondary to compression over her peroneal nerve during surgery.  This will likely resolve over the next 1 to 2 months.  I recommended she start vitamin B6 at this time to help with this process.  There is not appear to be any motor loss associated.  Lewanda Rife, MD [facility] 01/26/2018 10:13 AM   I was the preceptor for this visit and available for immediate consultation. Lilia Argue, DO

## 2018-02-17 ENCOUNTER — Other Ambulatory Visit: Payer: Self-pay | Admitting: Gastroenterology

## 2018-02-17 DIAGNOSIS — R7989 Other specified abnormal findings of blood chemistry: Secondary | ICD-10-CM

## 2018-02-17 DIAGNOSIS — R945 Abnormal results of liver function studies: Principal | ICD-10-CM

## 2018-02-28 ENCOUNTER — Ambulatory Visit
Admission: RE | Admit: 2018-02-28 | Discharge: 2018-02-28 | Disposition: A | Discharge status: Home / Self Care | Payer: PRIVATE HEALTH INSURANCE | Source: Ambulatory Visit | Attending: Gastroenterology | Admitting: Gastroenterology

## 2018-03-08 ENCOUNTER — Ambulatory Visit: Payer: PRIVATE HEALTH INSURANCE | Admitting: Sports Medicine

## 2018-08-31 LAB — HM MAMMOGRAPHY

## 2018-10-17 ENCOUNTER — Encounter: Payer: Self-pay | Admitting: Family Medicine

## 2019-01-19 ENCOUNTER — Other Ambulatory Visit: Payer: Self-pay

## 2019-01-19 DIAGNOSIS — Z20822 Contact with and (suspected) exposure to covid-19: Secondary | ICD-10-CM

## 2019-01-22 LAB — NOVEL CORONAVIRUS, NAA: SARS-CoV-2, NAA: NOT DETECTED

## 2019-10-04 ENCOUNTER — Other Ambulatory Visit (HOSPITAL_COMMUNITY): Payer: Self-pay | Admitting: Gastroenterology

## 2019-10-04 ENCOUNTER — Other Ambulatory Visit: Payer: Self-pay | Admitting: Gastroenterology

## 2019-10-04 DIAGNOSIS — R110 Nausea: Secondary | ICD-10-CM

## 2019-10-04 DIAGNOSIS — R1011 Right upper quadrant pain: Secondary | ICD-10-CM

## 2019-10-09 ENCOUNTER — Ambulatory Visit (HOSPITAL_COMMUNITY): Payer: PRIVATE HEALTH INSURANCE

## 2019-10-16 ENCOUNTER — Other Ambulatory Visit: Payer: Self-pay

## 2019-10-16 ENCOUNTER — Ambulatory Visit (HOSPITAL_COMMUNITY)
Admission: RE | Admit: 2019-10-16 | Discharge: 2019-10-16 | Disposition: A | Discharge status: Home / Self Care | Payer: PRIVATE HEALTH INSURANCE | Source: Ambulatory Visit | Attending: Gastroenterology | Admitting: Gastroenterology

## 2019-10-16 DIAGNOSIS — R110 Nausea: Secondary | ICD-10-CM | POA: Insufficient documentation

## 2019-10-16 DIAGNOSIS — R1011 Right upper quadrant pain: Secondary | ICD-10-CM | POA: Insufficient documentation

## 2019-10-19 ENCOUNTER — Encounter (HOSPITAL_COMMUNITY): Payer: Self-pay

## 2019-10-31 IMAGING — CT CT ABD-PELV W/ CM
2 of 5 series · 16 of 46 positions shown, 18 images · IV contrast (APPLIED)
Comparison: 01/06/2016 and previous

CLINICAL DATA: Fever and abdominal pain post colonoscopy 2 days ago

EXAM:
CT ABDOMEN AND PELVIS WITH CONTRAST
TECHNIQUE: Multidetector CT imaging of the abdomen and pelvis was performed
using the standard protocol following bolus administration of
intravenous contrast.
CONTRAST:  100mL [id number] IOPAMIDOL ([id number]) INJECTION 61%

[Series 2: axial st · axial · 0.69mm/px · z∈[-589,-194]mm · 13 of 89 slices shown, 15 images]
[im 5/89  soft-tissue]
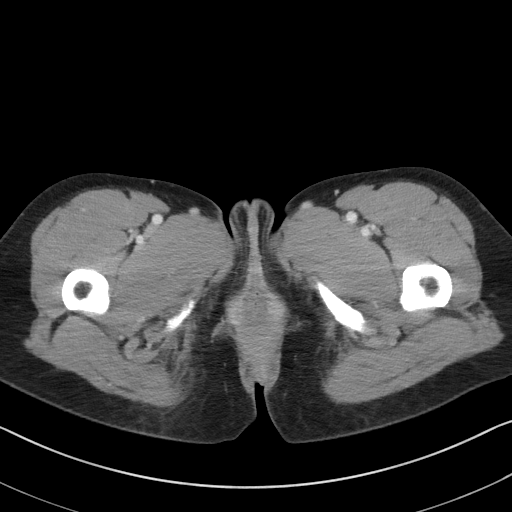
[im 5/89  bone]
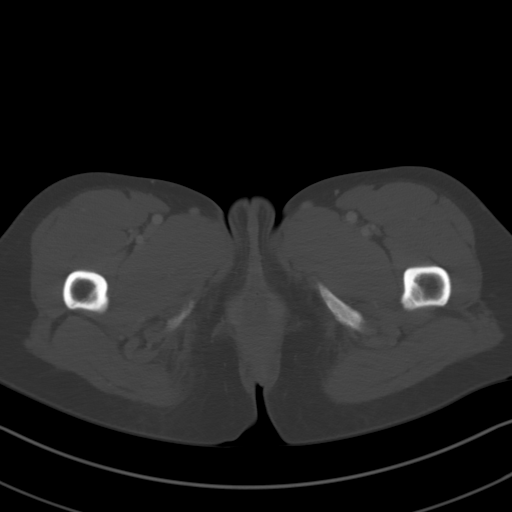
[im 10/89  soft-tissue]
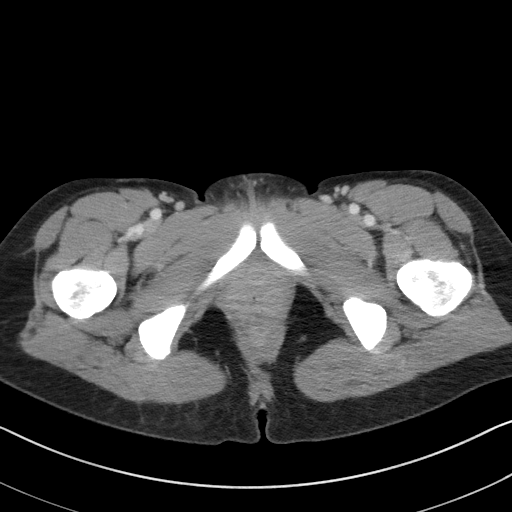
[im 20/89  soft-tissue]
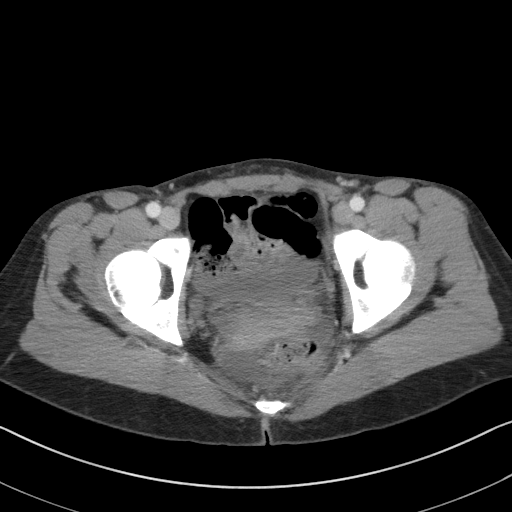
[im 25/89  soft-tissue]
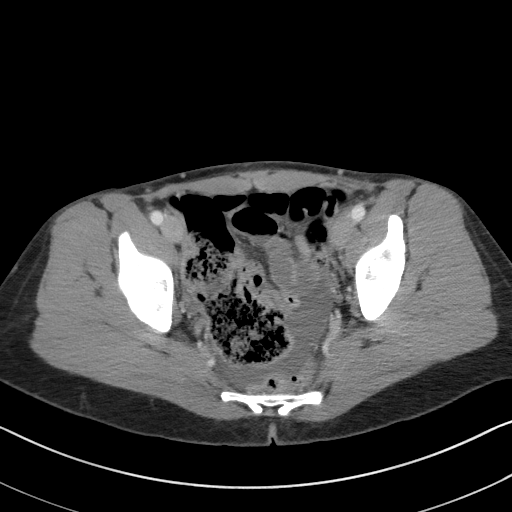
[im 30/89  soft-tissue]
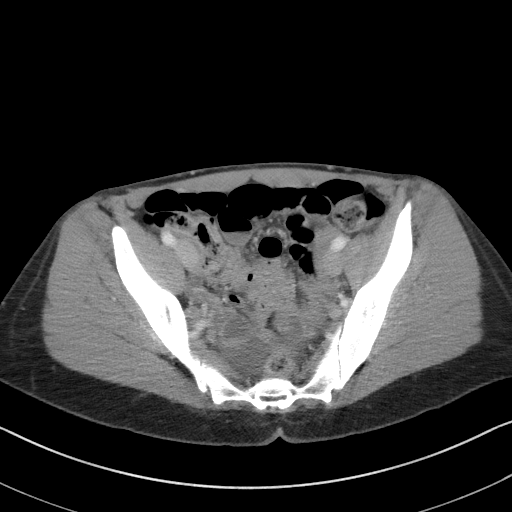
[im 40/89  soft-tissue]
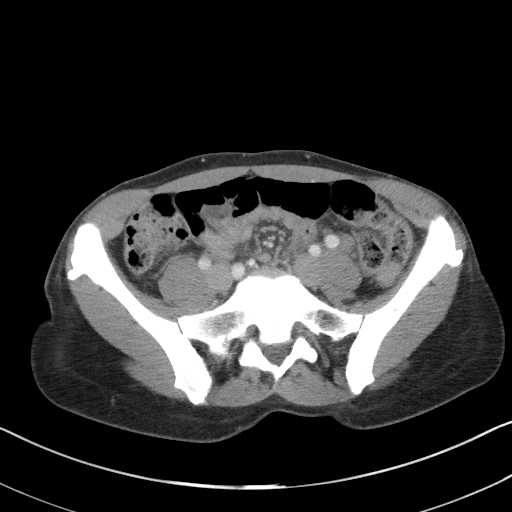
[im 45/89  soft-tissue]
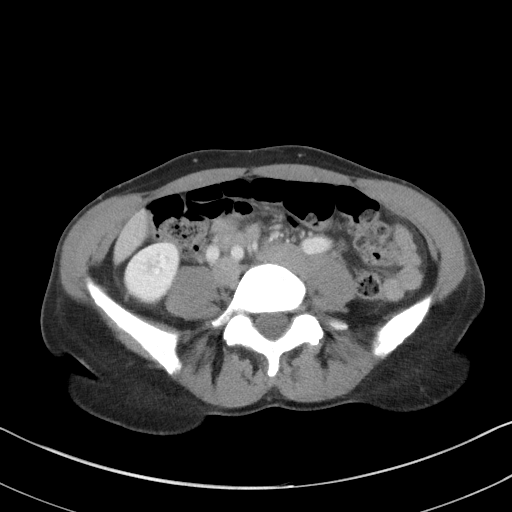
[im 49/89  soft-tissue]
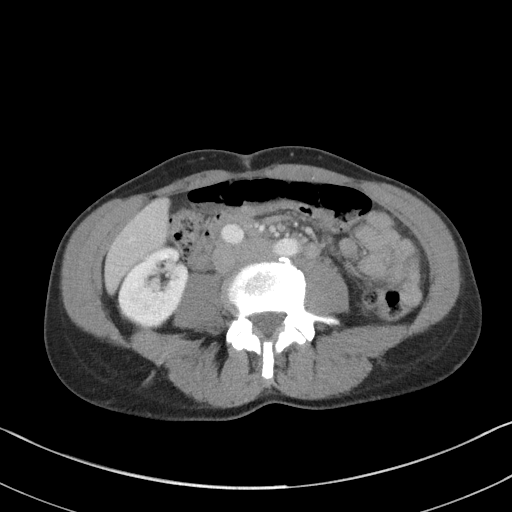
[im 59/89  soft-tissue]
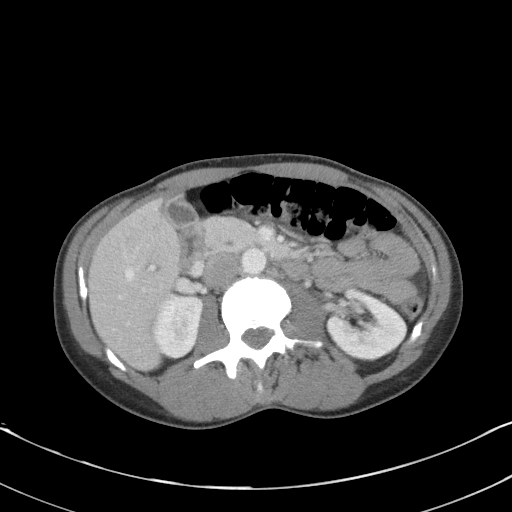
[im 59/89  bone]
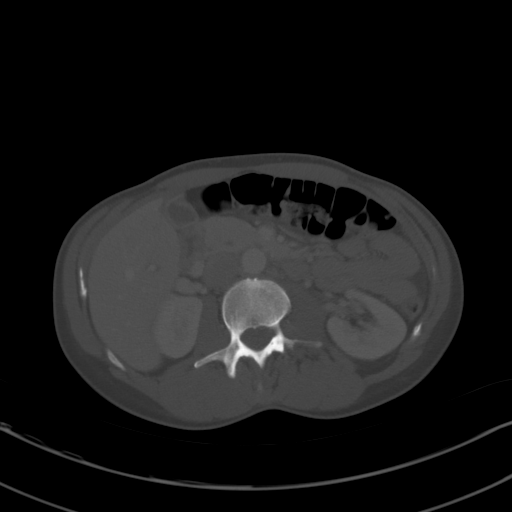
[im 64/89  soft-tissue]
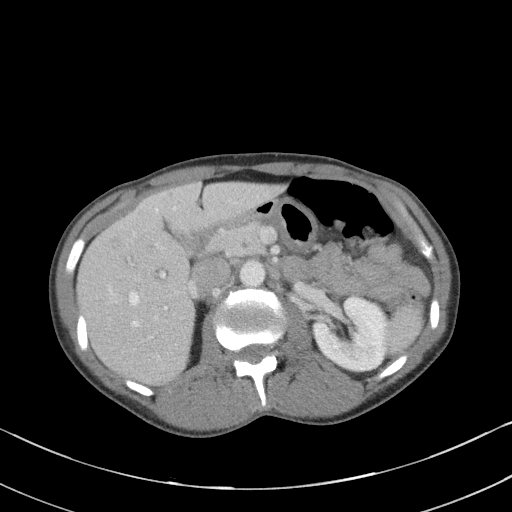
[im 69/89  soft-tissue]
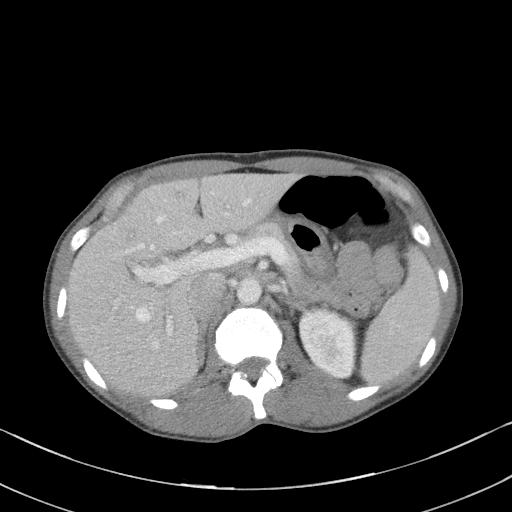
[im 79/89  soft-tissue]
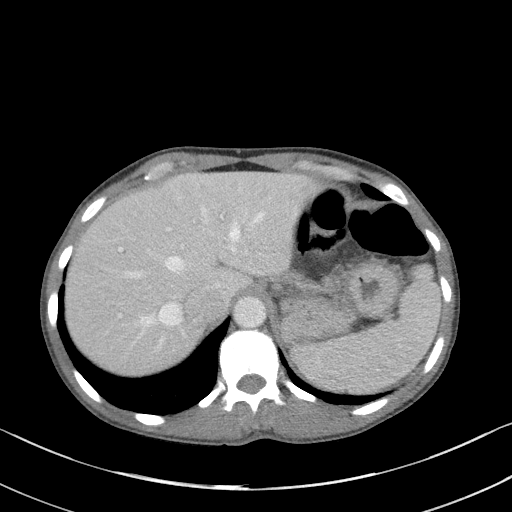
[im 84/89  soft-tissue]
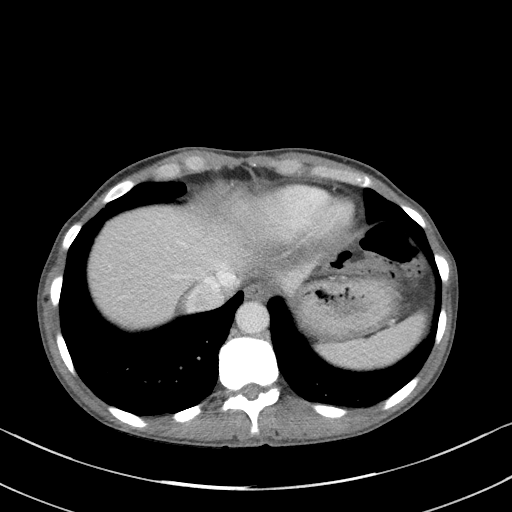

[Series 5: coronal st · coronal · 0.72mm/px · 3 of 67 slices shown]
[im 23/67  soft-tissue]
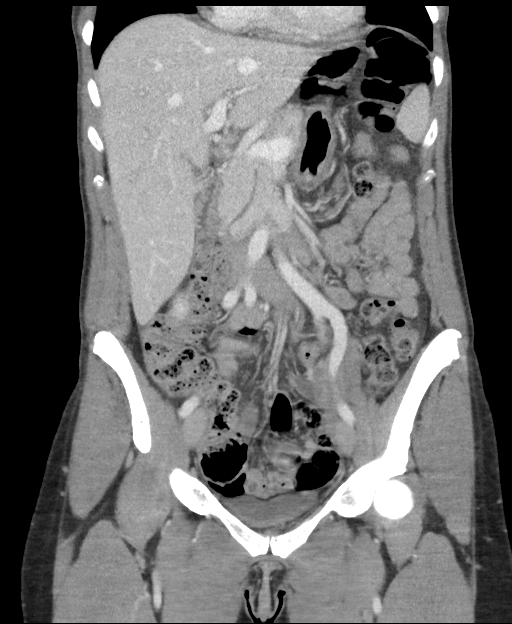
[im 30/67  soft-tissue]
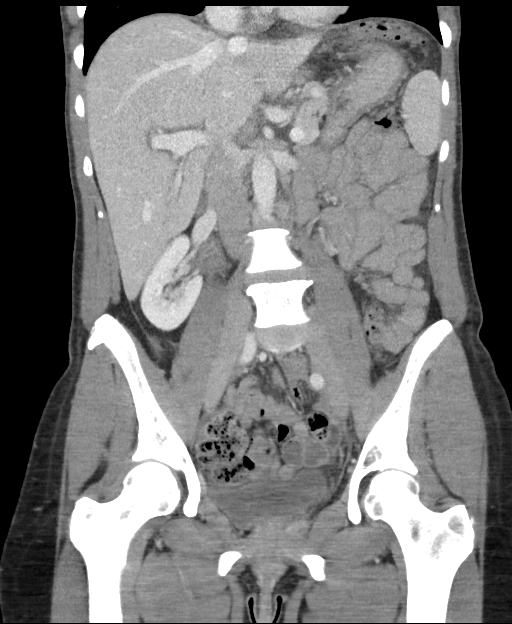
[im 37/67  soft-tissue]
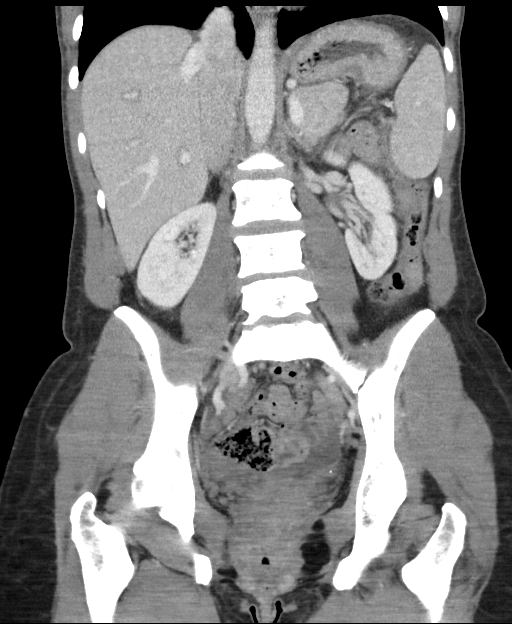

[16 of 46 positions shown; findings below may reference images not displayed]

FINDINGS: Lower chest: No acute abnormality.

Hepatobiliary: No focal liver lesion or biliary ductal dilatation.
Mild periportal edema, nonspecific. The gallbladder is nondistended.

Pancreas: Unremarkable. No pancreatic ductal dilatation or
surrounding inflammatory changes.

Spleen: Normal in size without focal abnormality.

Adrenals/Urinary Tract: Normal adrenal glands. No hydronephrosis.
Stable subcentimeter probable cyst, lower pole right kidney. No
worrisome renal lesion. Urinary bladder is incompletely distended.

Stomach/Bowel: Stomach is nondistended. Small bowel decompressed.
Normal appendix. The colon is nondilated, unremarkable.

Vascular/Lymphatic: No atheromatous change. Portal vein patent. No
abdominal or pelvic adenopathy.

Reproductive: Status post hysterectomy. 2.2 cm hemorrhagic right
ovarian cyst. No adnexal mass.

Other: Small amount of pelvic ascites without loculation or
enhancement. No free air.

Musculoskeletal: Residual 2.8 x 1.1 cm soft tissue thickening along
the anteromedial aspect of the left external iliac vessels probably
residual from previous large pelvic wall hematoma. Benign bone
islands in the left iliac bone, stable since 09/25/2011. Negative
for fracture or worrisome bone lesion.
IMPRESSION: 1. No acute findings.  No evidence of perforation or abscess.
2. Small amount of pelvic ascites, which can be physiologic in
ovulating females.
3. Residual soft tissue thickening along the left pelvic sidewall,
site of previous large hematoma.

## 2019-12-12 ENCOUNTER — Other Ambulatory Visit: Payer: Self-pay | Admitting: Obstetrics and Gynecology

## 2020-03-18 ENCOUNTER — Other Ambulatory Visit: Payer: PRIVATE HEALTH INSURANCE

## 2020-03-18 DIAGNOSIS — Z20822 Contact with and (suspected) exposure to covid-19: Secondary | ICD-10-CM

## 2020-03-20 LAB — SARS-COV-2, NAA 2 DAY TAT

## 2020-03-20 LAB — NOVEL CORONAVIRUS, NAA: SARS-CoV-2, NAA: NOT DETECTED

## 2020-06-19 ENCOUNTER — Ambulatory Visit (INDEPENDENT_AMBULATORY_CARE_PROVIDER_SITE_OTHER): Payer: PRIVATE HEALTH INSURANCE

## 2020-06-19 ENCOUNTER — Ambulatory Visit (INDEPENDENT_AMBULATORY_CARE_PROVIDER_SITE_OTHER): Payer: PRIVATE HEALTH INSURANCE | Admitting: Podiatry

## 2020-06-19 ENCOUNTER — Other Ambulatory Visit: Payer: Self-pay

## 2020-06-19 DIAGNOSIS — M2042 Other hammer toe(s) (acquired), left foot: Secondary | ICD-10-CM

## 2020-06-19 DIAGNOSIS — M79672 Pain in left foot: Secondary | ICD-10-CM

## 2020-06-19 DIAGNOSIS — M2041 Other hammer toe(s) (acquired), right foot: Secondary | ICD-10-CM | POA: Diagnosis not present

## 2020-06-19 DIAGNOSIS — M79671 Pain in right foot: Secondary | ICD-10-CM

## 2020-06-19 DIAGNOSIS — L989 Disorder of the skin and subcutaneous tissue, unspecified: Secondary | ICD-10-CM | POA: Diagnosis not present

## 2020-06-19 NOTE — Progress Notes (Signed)
   Subjective: 55 y.o. female presenting to the office today as a new patient for evaluation of bilateral foot pain.  Patient is very active and runs and plays tennis.  She states that she has developed painful calluses to the bilateral second toes.  She states that a few years back surgery was recommended.  She has tried custom molded orthotics which have not helped alleviate any of her pain.  She presents for further treatment evaluation   Past Medical History:  Diagnosis Date  . Abdominal bruit    fem neg doppler exam  . Anemia   . Asthma    exercise induced-weather - rarely uses inhaler  . Galactorrhea 06/21/2010  . Headache   . History of chickenpox   . History of hypertension 1998   resolved  . Hx of pyelonephritis 2013   after surgery culture negative   . Hypertension 1998   history of HTN- no meds currently  . Hypothyroidism   . Internal hemorrhoids   . Metatarsal stress fracture 07/22/2011   Based on scan and exam 80% likelihood of stress fracture but appears early   . PONV (postoperative nausea and vomiting)   . Rectal pressure 06/04/2011   while running   . Seasonal allergies   . SVD (spontaneous vaginal delivery)    x 3     Objective:  Physical Exam General: Alert and oriented x3 in no acute distress  Dermatology: Hyperkeratotic lesion(s) present on the second digits bilateral. Pain on palpation with a central nucleated core noted. Skin is warm, dry and supple bilateral lower extremities. Negative for open lesions or macerations.  Vascular: Palpable pedal pulses bilaterally. No edema or erythema noted. Capillary refill within normal limits.  Neurological: Epicritic and protective threshold grossly intact bilaterally.   Musculoskeletal Exam: No significant pedal deformities noted  Assessment: 1.  InterDigital corn second digit bilateral   Plan of Care:  1. Patient evaluated 2. Excisional debridement of keratoic lesion(s) using a chisel blade was performed  without incident.  3.  Recommend wide fitting shoes 4.  Silicone toe spacers were provided 5.  Return to clinic as needed  Edrick Kins, DPM [facility]  Dr. Edrick Kins, DPM    [address], [place] [address]                Office [phone]  Fax [phone]

## 2020-08-27 ENCOUNTER — Ambulatory Visit: Payer: PRIVATE HEALTH INSURANCE | Admitting: Orthopaedic Surgery

## 2020-08-27 ENCOUNTER — Ambulatory Visit (INDEPENDENT_AMBULATORY_CARE_PROVIDER_SITE_OTHER): Payer: PRIVATE HEALTH INSURANCE

## 2020-08-27 ENCOUNTER — Encounter: Payer: Self-pay | Admitting: Orthopaedic Surgery

## 2020-08-27 ENCOUNTER — Other Ambulatory Visit: Payer: Self-pay

## 2020-08-27 VITALS — Ht 66.0 in | Wt 128.0 lb

## 2020-08-27 DIAGNOSIS — M25551 Pain in right hip: Secondary | ICD-10-CM

## 2020-08-27 NOTE — Progress Notes (Signed)
Office Visit Note   Patient: Joanne Mccarty           Date of Birth: Nov 21, 1965           MRN: [id number] Visit Date: 08/27/2020              Requested by: Joanne Mccarty, Joanne Mccarty,  Joanne Mccarty [id number] PCP: Joanne Lass, MD   Assessment & Plan: Visit Diagnoses:  1. Right hip pain     Plan: Based on findings my impression is that she tore gluteus maximus or the deeper muscle group the external rotators.  I am not getting the sense that she tore her proximal hamstring based on where she is most tender.  She is able to walk on the leg and does not need a cane or crutches.  I recommend rest from running and tennis for 6 weeks to allow for proper muscle healing.  She will use over-the-counter medications for now.  She will let me know if she has any problems going forward.  Follow-Up Instructions: Return if symptoms worsen or fail to improve.   Orders:  Orders Placed This Encounter  Procedures  . XR HIP UNILAT W OR W/O PELVIS 2-3 VIEWS RIGHT   No orders of the defined types were placed in this encounter.     Procedures: No procedures performed   Clinical Data: No additional findings.   Subjective: Chief Complaint  Patient presents with  . Left Hip - Pain    Joanne Mccarty is a very pleasant 55 year old female wife of Joanne Mccarty who comes in for evaluation of acute injury to the right hip while playing tennis earlier today just a few hours ago.  She was moving back and to the right side for a ball and she felt like her right hip dislocated and suddenly she fell to the ground.  She reports mainly pain in the buttock area with occasional groin pain.  She does have some numbness and tingling in the lower leg and the toes.  Denies any back pain.  She has more pain when sitting due to the direct pressure.  She was originally helped off with the court but was able to then walk to her car and she was able to walk into our office today.   Review of Systems   Constitutional: Negative.   HENT: Negative.   Eyes: Negative.   Respiratory: Negative.   Cardiovascular: Negative.   Endocrine: Negative.   Musculoskeletal: Negative.   Neurological: Negative.   Hematological: Negative.   Psychiatric/Behavioral: Negative.   All other systems reviewed and are negative.    Objective: Vital Signs: Ht 5\' 6"  (1.676 m)   Wt 128 lb (58.1 kg)   LMP 08/13/2011   BMI 20.66 kg/m   Physical Exam Vitals and nursing note reviewed.  Constitutional:      Appearance: She is well-developed.  Pulmonary:     Effort: Pulmonary effort is normal.  Skin:    General: Skin is warm.     Capillary Refill: Capillary refill takes less than 2 seconds.  Neurological:     Mental Status: She is alert and oriented to person, place, and time.  Psychiatric:        Behavior: Behavior normal.        Thought Content: Thought content normal.        Judgment: Judgment normal.     Ortho Exam Right hip shows good range of motion without significant pain to  the groin.  Lateral hip is nontender.  Negative sciatic tension signs.  No focal motor or sensory deficits.  She is able to abduct her hip to 45 degrees without pain.  She has mild tenderness to the ischial tuberosity but more so in the buttock area over the gluteus maximus and the external rotator muscle group.  Significant pain with passive hip flexion and straight leg.  Good strength and minimal pain with resisted hamstring contraction. Right knee exam shows no joint effusion.  Collaterals and cruciates are stable.  Range of motion is normal.  The distal hamstring tendons are nontender.  No joint line tenderness. Specialty Comments:  No specialty comments available.  Imaging: XR HIP UNILAT W OR W/O PELVIS 2-3 VIEWS RIGHT  Result Date: 08/27/2020 No acute or structural abnormalities.  Slight irregularity near the ischial tuberosity.  Negative for avulsion fracture.    PMFS History: Patient Active Problem List    Diagnosis Date Noted  . Hepatitis A virus infection 08/31/2017  . Chronic left-sided low back pain without sciatica 01/15/2016  . Hematoma 10/08/2015  . Urinary, incontinence, stress female 10/02/2015  . Postprandial hypoglycemia 10/24/2014  . Piriformis syndrome of right side 07/04/2014  . Ruptured plantar fascia 06/23/2013  . Hemorrhage of rectum and anus 03/10/2012  . Internal hemorrhoids with other complication 29/92/4268  . Incidental pulmonary nodule, > 91mm and < 55mm 01/11/2012  . H/O: hysterectomy 01/11/2012  . Hx of pyelonephritis   . Abdominal bruit   . Rectocele 06/04/2011  . IRON DEFICIENCY 01/02/2009  . TOBACCO USE, QUIT 01/02/2009  . RAYNAUD'S SYNDROME, HX OF 10/09/2008  . CONSTIPATION 07/20/2008  . Insomnia 08/05/2007  . Hypothyroidism 02/04/2007  . ANEMIA-NOS 02/04/2007  . Exercise-induced bronchospasm 02/04/2007  . Other symptoms involving cardiovascular system 02/04/2007   Past Medical History:  Diagnosis Date  . Abdominal bruit    fem neg doppler exam  . Anemia   . Asthma    exercise induced-weather - rarely uses inhaler  . Galactorrhea 06/21/2010  . Headache   . History of chickenpox   . History of hypertension 1998   resolved  . Hx of pyelonephritis 2013   after surgery culture negative   . Hypertension 1998   history of HTN- no meds currently  . Hypothyroidism   . Internal hemorrhoids   . Metatarsal stress fracture 07/22/2011   Based on scan and exam 80% likelihood of stress fracture but appears early   . PONV (postoperative nausea and vomiting)   . Rectal pressure 06/04/2011   while running   . Seasonal allergies   . SVD (spontaneous vaginal delivery)    x 3    Family History  Problem Relation Age of Onset  . Hypertension Mother   . Melanoma Mother   . Hyperlipidemia Father   . Crohn's disease Brother   . Hypothyroidism Son        2011  . Cancer Maternal Grandfather        liver  . Diabetes Maternal Uncle   . Cancer Paternal Grandmother    . Cancer Paternal Grandfather   . Colon cancer Neg Hx     Past Surgical History:  Procedure Laterality Date  . ANTERIOR AND POSTERIOR REPAIR N/A 10/02/2015   Procedure: ANTERIOR (CYSTOCELE) REPAIR;  Surgeon: Delsa Bern, MD;  Location: [facility];  Service: Gynecology;  Laterality: N/A;  . BLADDER SUSPENSION N/A 10/02/2015   Procedure: TRANSVAGINAL TAPE (TVT) PROCEDURE;  Surgeon: Everett Graff, MD;  Location: [facility];  Service: Gynecology;  Laterality: N/A;  . BREAST SURGERY     left bx  . COLONOSCOPY  03/10/2012   Procedure: COLONOSCOPY;  Surgeon: Lafayette Dragon, MD;  Location: [facility];  Service: Endoscopy;  Laterality: N/A;  . CYSTOSCOPY N/A 10/02/2015   Procedure: CYSTOSCOPY;  Surgeon: Everett Graff, MD;  Location: [facility];  Service: Gynecology;  Laterality: N/A;  . DILATION AND CURETTAGE OF UTERUS  12/2007  . dysplastic lesion      x 3 4/08  . ENDOMETRIAL ABLATION  april 2009  . HEMORRHOID BANDING  03/10/2012   Procedure: HEMORRHOID BANDING;  Surgeon: Lafayette Dragon, MD;  Location: [facility];  Service: Endoscopy;  Laterality: N/A;  . LAPAROSCOPIC LYSIS OF ADHESIONS N/A 05/14/2017   Procedure: LAPAROSCOPIC LYSIS OF ADHESIONS;  Surgeon: Delsa Bern, MD;  Location: [facility];  Service: Gynecology;  Laterality: N/A;  . LAPAROSCOPY N/A 05/14/2017   Procedure: LAPAROSCOPY OPERATIVE;  Surgeon: Delsa Bern, MD;  Location: [facility];  Service: Gynecology;  Laterality: N/A;  . NOVASURE ABLATION    . RECTOCELE REPAIR  09/08/2011   Procedure: POSTERIOR REPAIR (RECTOCELE);  Surgeon: Alwyn Pea, MD;  Location: [facility];  Service: Gynecology;;  . robotic cystectomy  02/2010  . VAGINAL HYSTERECTOMY  09/08/2011   Procedure: HYSTERECTOMY VAGINAL;  Surgeon: Alwyn Pea, MD;  Location: [facility];  Service: Gynecology;  Laterality: N/A;  . WISDOM TOOTH EXTRACTION     Social History   Occupational History    Employer: UNEMPLOYED  Tobacco Use  . Smoking status: Former Smoker    Years: 5.00     Types: Cigarettes    Quit date: 09/03/1987    Years since quitting: 33.0  . Smokeless tobacco: Never Used  Vaping Use  . Vaping Use: Never used  Substance and Sexual Activity  . Alcohol use: Yes    Alcohol/week: 14.0 standard drinks    Types: 14 Glasses of wine per week    Comment: beer/wine daily  . Drug use: No  . Sexual activity: Not Currently    Birth control/protection: Surgical    Comment: VAS AND HYST

## 2020-10-03 ENCOUNTER — Ambulatory Visit (INDEPENDENT_AMBULATORY_CARE_PROVIDER_SITE_OTHER): Payer: No Typology Code available for payment source | Admitting: Orthopaedic Surgery

## 2020-10-03 DIAGNOSIS — M25551 Pain in right hip: Secondary | ICD-10-CM | POA: Diagnosis not present

## 2020-10-03 NOTE — Progress Notes (Signed)
Office Visit Note   Patient: Joanne Mccarty           Date of Birth: 09-06-1965           MRN: [id number] Visit Date: 10/03/2020              Requested by: Kathyrn Lass, [place],  [place] [id number] PCP: Kathyrn Lass, MD   Assessment & Plan: Visit Diagnoses:  1. Right hip pain     Plan: Impression is right proximal hamstring tendon injury. Given where the pain has settled and the subsequent ecchymosis it is likely that she partially tore her proximal hamstring with her initial injury.  I would like to obtain an MRI at this time to evaluate the full extent of her injury to help me guide treatment.  She will likely have this done at [facility] as she is also getting a wrist MRI.  She will bring the CD and report after the MRI has been completed.  In the meantime, she would benefit from stretching exercises, but does not need to work on strength until the tendon has had time to heal in. At this point I feel it may be too soon to return to high impact activities like running and tennis if she feels that she is pushing through pain. Our concern is that she would set herself back or re-injure herself. We recommend that she wait to return to running and tennis until she is pain free with daily activities like walking and hills. In the meantime she can try low impact activities like stationary biking, swimming as long as these are pain free. We discussed the natural history of proximal hamstring injuries and expected recovery time. We anticipate that her pain will continue to improve over the next 6 weeks but it may take several months before she feels that she has returned to her baseline.   Follow-Up Instructions: Return if symptoms worsen or fail to improve.   Orders:  Orders Placed This Encounter  Procedures   MR Hip Right w/o contrast   No orders of the defined types were placed in this encounter.     Procedures: No procedures performed   Clinical Data: No  additional findings.   Subjective: Chief Complaint  Patient presents with   Right Hip - Pain    HPI Joanne Mccarty is a 55 y.o. female who returns to clinic for right hip pain with acute onset 5 weeks ago following an injury while playing tennis. At that time the majority of her pain was lateral around the gluteal tendon insertion. Today, she reports that her hip pain overall has improved. She describes achy pain in the gluteal fold that radiates down the back of her thigh to about the mid thigh. She has some discomfort with walking especially up hills. Her hamstring feels tight and she has trouble straightening out her leg. She recalls that she did develop some bruising in the back of her thigh after our last visit. She has been resting for the the last 5 weeks. She tried running two miles a few days ago and had increased hamstring pain afterwards. She is also having some intermittent numbness in the posterior knee and toes when she runs. She is taking Ibuprofen as needed. She is eager to return to running and playing tennis and is wondering if there are any exercises she should be doing and when she can return.   Review of Systems Review of Systems was  reviewed and negative unless as stated in the HPI.   Objective: Vital Signs: LMP 08/13/2011   Physical Exam  Ortho Exam Right hip exam demonstrates tenderness over the ischial tuberosity and proximal hamstring. She has some discomfort in the proximal hamstring with prone resisted knee flexion but no frank weakness. No tenderness over the greater trochanter or gluteal tendon insertions. Hip ROM is pain free and within normal limits with flexion, internal rotation and external rotation. Distal neurovascular exam intact.   Specialty Comments:  No specialty comments available.  Imaging: No new imaging  PMFS History: Patient Active Problem List   Diagnosis Date Noted   Hepatitis A virus infection 08/31/2017   Chronic left-sided low back  pain without sciatica 01/15/2016   Hematoma 10/08/2015   Urinary, incontinence, stress female 10/02/2015   Postprandial hypoglycemia 10/24/2014   Piriformis syndrome of right side 07/04/2014   Ruptured plantar fascia 06/23/2013   Hemorrhage of rectum and anus 03/10/2012   Internal hemorrhoids with other complication 40/98/1191   Incidental pulmonary nodule, > 78mm and < 39mm 01/11/2012   H/O: hysterectomy 01/11/2012   Hx of pyelonephritis    Abdominal bruit    Rectocele 06/04/2011   IRON DEFICIENCY 01/02/2009   TOBACCO USE, QUIT 01/02/2009   RAYNAUD'S SYNDROME, HX OF 10/09/2008   CONSTIPATION 07/20/2008   Insomnia 08/05/2007   Hypothyroidism 02/04/2007   ANEMIA-NOS 02/04/2007   Exercise-induced bronchospasm 02/04/2007   Other symptoms involving cardiovascular system 02/04/2007   Past Medical History:  Diagnosis Date   Abdominal bruit    fem neg doppler exam   Anemia    Asthma    exercise induced-weather - rarely uses inhaler   Galactorrhea 06/21/2010   Headache    History of chickenpox    History of hypertension 1998   resolved   Hx of pyelonephritis 2013   after surgery culture negative    Hypertension 1998   history of HTN- no meds currently   Hypothyroidism    Internal hemorrhoids    Metatarsal stress fracture 07/22/2011   Based on scan and exam 80% likelihood of stress fracture but appears early    PONV (postoperative nausea and vomiting)    Rectal pressure 06/04/2011   while running    Seasonal allergies    SVD (spontaneous vaginal delivery)    x 3    Family History  Problem Relation Age of Onset   Hypertension Mother    Melanoma Mother    Hyperlipidemia Father    Crohn's disease Brother    Hypothyroidism Son        2011   Cancer Maternal Grandfather        liver   Diabetes Maternal Uncle    Cancer Paternal Grandmother    Cancer Paternal Grandfather    Colon cancer Neg Hx     Past Surgical History:  Procedure Laterality Date   ANTERIOR AND POSTERIOR  REPAIR N/A 10/02/2015   Procedure: ANTERIOR (CYSTOCELE) REPAIR;  Surgeon: Delsa Bern, MD;  Location: [facility];  Service: Gynecology;  Laterality: N/A;   BLADDER SUSPENSION N/A 10/02/2015   Procedure: TRANSVAGINAL TAPE (TVT) PROCEDURE;  Surgeon: Everett Graff, MD;  Location: [facility];  Service: Gynecology;  Laterality: N/A;   BREAST SURGERY     left bx   COLONOSCOPY  03/10/2012   Procedure: COLONOSCOPY;  Surgeon: Lafayette Dragon, MD;  Location: [facility];  Service: Endoscopy;  Laterality: N/A;   CYSTOSCOPY N/A 10/02/2015   Procedure: CYSTOSCOPY;  Surgeon: Everett Graff, MD;  Location: [facility];  Service: Gynecology;  Laterality: N/A;   DILATION AND CURETTAGE OF UTERUS  12/2007   dysplastic lesion      x 3 4/08   ENDOMETRIAL ABLATION  april 2009   HEMORRHOID BANDING  03/10/2012   Procedure: HEMORRHOID BANDING;  Surgeon: Lafayette Dragon, MD;  Location: [facility];  Service: Endoscopy;  Laterality: N/A;   LAPAROSCOPIC LYSIS OF ADHESIONS N/A 05/14/2017   Procedure: LAPAROSCOPIC LYSIS OF ADHESIONS;  Surgeon: Delsa Bern, MD;  Location: [facility];  Service: Gynecology;  Laterality: N/A;   LAPAROSCOPY N/A 05/14/2017   Procedure: LAPAROSCOPY OPERATIVE;  Surgeon: Delsa Bern, MD;  Location: [facility];  Service: Gynecology;  Laterality: N/A;   NOVASURE ABLATION     RECTOCELE REPAIR  09/08/2011   Procedure: POSTERIOR REPAIR (RECTOCELE);  Surgeon: Alwyn Pea, MD;  Location: [facility];  Service: Gynecology;;   robotic cystectomy  02/2010   VAGINAL HYSTERECTOMY  09/08/2011   Procedure: HYSTERECTOMY VAGINAL;  Surgeon: Alwyn Pea, MD;  Location: [facility];  Service: Gynecology;  Laterality: N/A;   WISDOM TOOTH EXTRACTION     Social History   Occupational History    Employer: UNEMPLOYED  Tobacco Use   Smoking status: Former    Years: 5.00    Pack years: 0.00    Types: Cigarettes    Quit date: 09/03/1987    Years since quitting: 33.1   Smokeless tobacco: Never  Vaping Use   Vaping Use: Never used   Substance and Sexual Activity   Alcohol use: Yes    Alcohol/week: 14.0 standard drinks    Types: 14 Glasses of wine per week    Comment: beer/wine daily   Drug use: No   Sexual activity: Not Currently    Birth control/protection: Surgical    Comment: VAS AND HYST

## 2020-10-08 ENCOUNTER — Telehealth: Payer: Self-pay

## 2020-10-08 NOTE — Telephone Encounter (Signed)
Order has been sent to fax number provided and pt is aware

## 2020-10-08 NOTE — Telephone Encounter (Signed)
Patient called she is requesting the referral for her right hip to be sent to [facility] ASAP she is requesting a call back when the referral has been sent fax:[phone] call [phone]

## 2020-10-08 NOTE — Telephone Encounter (Signed)
Please see below.

## 2020-10-15 ENCOUNTER — Other Ambulatory Visit: Payer: No Typology Code available for payment source

## 2020-10-18 ENCOUNTER — Telehealth: Payer: Self-pay

## 2020-10-18 NOTE — Telephone Encounter (Signed)
I was able to see it in pacs.  I've called her.  Thanks.

## 2020-10-18 NOTE — Telephone Encounter (Signed)
The report is in the chart. Please advise pt regarding bringing the CD

## 2020-10-18 NOTE — Telephone Encounter (Signed)
Pt called and wanted to know if we received her MRI or if she needs to bring the disk to the office. Please advise.

## 2020-10-25 ENCOUNTER — Telehealth: Payer: Self-pay | Admitting: Orthopaedic Surgery

## 2020-10-25 NOTE — Telephone Encounter (Signed)
Patient called. She would like a PT referral sent to [facility]. Her call back number is [phone]

## 2020-10-28 ENCOUNTER — Other Ambulatory Visit: Payer: Self-pay

## 2020-10-28 DIAGNOSIS — M25551 Pain in right hip: Secondary | ICD-10-CM

## 2020-10-28 NOTE — Telephone Encounter (Signed)
Referral made. Patient aware.

## 2021-06-26 ENCOUNTER — Ambulatory Visit: Payer: Self-pay

## 2021-06-26 ENCOUNTER — Ambulatory Visit (INDEPENDENT_AMBULATORY_CARE_PROVIDER_SITE_OTHER): Payer: PRIVATE HEALTH INSURANCE | Admitting: Sports Medicine

## 2021-06-26 VITALS — BP 98/70 | Ht 66.0 in | Wt 130.0 lb

## 2021-06-26 DIAGNOSIS — M7139 Other bursal cyst, multiple sites: Secondary | ICD-10-CM | POA: Diagnosis not present

## 2021-06-26 DIAGNOSIS — M79604 Pain in right leg: Secondary | ICD-10-CM

## 2021-06-26 DIAGNOSIS — S76301A Unspecified injury of muscle, fascia and tendon of the posterior muscle group at thigh level, right thigh, initial encounter: Secondary | ICD-10-CM

## 2021-06-26 DIAGNOSIS — M6749 Ganglion, multiple sites: Secondary | ICD-10-CM

## 2021-06-26 DIAGNOSIS — S76311A Strain of muscle, fascia and tendon of the posterior muscle group at thigh level, right thigh, initial encounter: Secondary | ICD-10-CM

## 2021-06-26 DIAGNOSIS — M6789 Other specified disorders of synovium and tendon, multiple sites: Secondary | ICD-10-CM

## 2021-06-26 NOTE — Assessment & Plan Note (Signed)
This is not painful so we will continue conservative care and compression ? ?We can do barbotage and drainage under [place] if it becomes symptomatic ?

## 2021-06-26 NOTE — Progress Notes (Signed)
PCP: Kathyrn Lass, MD ? ?Subjective:  ? ?HPI: ?Patient is a 56 y.o. female with history of right proximal hamstring partial tear (May 2022) who presents with right knee swelling and continued right proximal hamstring pain.  ?Patient reports that she developed an area of swelling below the joint line of the medial right knee over a year ago. The area is not painful or red, but has increased some in size. She has previously had an xray and ultrasound of the area at a different clinic and told it was likely bursitis vs ganglion cyst. The area of swelling continues to be non-tender, however her medial and lateral knee joint line has been a little sore recently.  ?Additionally, patient injured her right hamstring in May 2022, confirmed via MRI in July to have "low-grade partial tearing of the right proximal hamstring tendons ?and peritendinitis primarily affecting the proximal semimembranosus ?tendon". She did not receive a physical therapy regimen to treat this injury, so has mainly been trying to rest it. However, it has continued to bother her near the ischial tuberosity, particularly when running.  ? ? ?Past Medical History:  ?Diagnosis Date  ? Abdominal bruit   ? fem neg doppler exam  ? Anemia   ? Asthma   ? exercise induced-weather - rarely uses inhaler  ? 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 Headache   ? History of chickenpox   ? History of hypertension 1998  ? resolved  ? Hx of pyelonephritis 2013  ? after surgery culture negative   ? Hypertension 1998  ? history of HTN- no meds currently  ? Hypothyroidism   ? Internal hemorrhoids   ? Metatarsal stress fracture 07/22/2011  ? Based on scan and exam 80% likelihood of stress fracture but appears early   ? PONV (postoperative nausea and vomiting)   ? Rectal pressure 06/04/2011  ? while running   ? Seasonal allergies   ? SVD (spontaneous vaginal delivery)   ? x 3  ? ? ?Current Outpatient Medications on File Prior to Visit  ?Medication Sig Dispense Refill  ? docusate sodium (COLACE)  100 MG capsule Take 100 mg by mouth daily as needed for mild constipation.    ? estradiol (CLIMARA - DOSED IN MG/24 HR) 0.05 mg/24hr patch Place 1 patch onto the skin once a week. Sundays    ? ondansetron (ZOFRAN ODT) 4 MG disintegrating tablet '4mg'$  ODT q4 hours prn nausea/vomit (Patient taking differently: Take 4 mg by mouth every 4 (four) hours as needed for nausea or vomiting.) 20 tablet 0  ? rizatriptan (MAXALT-MLT) 10 MG disintegrating tablet Take 1 tablet (10 mg total) by mouth as needed for migraine. May repeat in 2 hours if needed 9 tablet 11  ? SYNTHROID 100 MCG tablet TAKE 1 TABLET BY MOUTH DAILY BEFORE BREAKFAST. 90 tablet 3  ? zolpidem (AMBIEN) 5 MG tablet Take 5 mg by mouth at bedtime as needed for sleep.    ? ?No current facility-administered medications on file prior to visit.  ? ? ?Past Surgical History:  ?Procedure Laterality Date  ? ANTERIOR AND POSTERIOR REPAIR N/A 10/02/2015  ? Procedure: ANTERIOR (CYSTOCELE) REPAIR;  Surgeon: Delsa Bern, MD;  Location: [facility];  Service: Gynecology;  Laterality: N/A;  ? BLADDER SUSPENSION N/A 10/02/2015  ? Procedure: TRANSVAGINAL TAPE (TVT) PROCEDURE;  Surgeon: Everett Graff, MD;  Location: [facility];  Service: Gynecology;  Laterality: N/A;  ? BREAST SURGERY    ? left bx  ? COLONOSCOPY  03/10/2012  ? Procedure: COLONOSCOPY;  Surgeon:  Lafayette Dragon, MD;  Location: [facility];  Service: Endoscopy;  Laterality: N/A;  ? CYSTOSCOPY N/A 10/02/2015  ? Procedure: CYSTOSCOPY;  Surgeon: Everett Graff, MD;  Location: [facility];  Service: Gynecology;  Laterality: N/A;  ? DILATION AND CURETTAGE OF UTERUS  12/2007  ? dysplastic lesion    ?  x 3 4/08  ? ENDOMETRIAL ABLATION  april 2009  ? HEMORRHOID BANDING  03/10/2012  ? Procedure: HEMORRHOID BANDING;  Surgeon: Lafayette Dragon, MD;  Location: [facility];  Service: Endoscopy;  Laterality: N/A;  ? LAPAROSCOPIC LYSIS OF ADHESIONS N/A 05/14/2017  ? Procedure: LAPAROSCOPIC LYSIS OF ADHESIONS;  Surgeon: Delsa Bern, MD;  Location: [facility];  Service: Gynecology;  Laterality: N/A;  ? LAPAROSCOPY N/A 05/14/2017  ? Procedure: LAPAROSCOPY OPERATIVE;  Surgeon: Delsa Bern, MD;  Location: [facility];  Service: Gynecology;  Laterality: N/A;  ? NOVASURE ABLATION    ? RECTOCELE REPAIR  09/08/2011  ? Procedure: POSTERIOR REPAIR (RECTOCELE);  Surgeon: Alwyn Pea, MD;  Location: [facility];  Service: Gynecology;;  ? robotic cystectomy  02/2010  ? VAGINAL HYSTERECTOMY  09/08/2011  ? Procedure: HYSTERECTOMY VAGINAL;  Surgeon: Alwyn Pea, MD;  Location: [facility];  Service: Gynecology;  Laterality: N/A;  ? WISDOM TOOTH EXTRACTION    ? ? ?Allergies  ?Allergen Reactions  ? Keflex [Cephalexin] Rash  ? ? ?BP 98/70   Ht '5\' 6"'$  (1.676 m)   Wt 130 lb (59 kg)   LMP 08/13/2011   BMI 20.98 kg/m?  ? ? ?  06/26/2021  ?  9:00 AM  ?[facility]  ?Frequency of aerobic exercise (# of days/week) 4  ?Average time in minutes 60  ?Frequency of strengthening activities (# of days/week) 0  ? ? ?   ? View : No data to display.  ?  ?  ?  ? ? ?    ?Objective:  ?Physical Exam: ? ?Gen: NAD, comfortable in exam room ?CV: Regular rate, well perfused ?Resp: No increased work of breathing, coughing or wheezing ?Psych: Normal mood and affect.  ?MSK: Approximately 2 cm area of swelling near the right pes anseris without erythema. Fluctuant on palpation, no tenderness. No TTP over R knee joint line. Right ischial tuberosity TTP. Full knee ROM bilaterally. Full hip ROM. 5/5 strength in knee extension bilaterally. 3/5 strength with R knee flexion with foot midline, 5/5 on left side. 5/5 lateral leg extension strength bilaterally. Negative drawers, varus, valgus, and Thessaly's. Negative log roll.  ? ?Ultrasound: Right Knee and Right Ischial tuberosity ? ?Anechoic complex cyst at the pes anseris originating from the terminal hamstring tendon.  ?Joint line shows normal width ?Medial meniscus looks intact ? ?Impression: probably ganglion cyst in right pes anserine tendon  complex ? ?Right Ischial Tuberosity ?Evidence of chronic scarring at the proximal hamstring insertion noted by thickening and hyperechoic change ?No acute hamstring tear.  ?No ischial tuberosity bursitis.  ?No calcific changes noted ? ?Impression: chronic insertional hamstring tendinopathy ? ?Ultrasound and interpretation by Wolfgang Phoenix. Fields, MD ? ? ?  ?Assessment & Plan:  ?1. Right hamstring pain - The patient's ongoing pain and weakness in her right hamstring is likely the result of inadequate rehabilitation of her original injury in May 2022. Reassuringly, there is no new tear visualized by limited ultrasound today, but there is evidence of chronic scaring. Patient was given Askling exercises to perform for 6 weeks and then we will reevaluate. At that time we may consider the addition  of shockwave therapy. Patient was advised to wear a hamstring compression sleeve when exercising. She may continue to run at a comfortable pace but was instructed to stop if this causes pain.  ? ?2. Right knee swelling - Patient has chronic swelling over the right pes anseris, which was confirmed on ultrasound today to be a complex pes anseris cyst. Given the lack of pain, we will proceed with a conservative approach. Patient advised to wear a knee compression sleeve. In addition, wrap an ACE bandage tightly around the affected are for 1 hour each night. We will reevaluate at next appointment to assess if the cyst has drained at all. ? ? ?Donald Pore ?MS4, [employer] ? ?I observed and examined the patient with the medical student and repeated all key parts of exam and hisotory.  I agree with assessment and plan.  Note reviewed and modified by me. ?Ila Mcgill, MD ? ? ? ?

## 2021-06-26 NOTE — Assessment & Plan Note (Signed)
I think her previous tear has healed  ?She needs a consistent rehab program for HEP ?Compression ?OK to run at comfortable level while keeping up rehab ?

## 2021-08-07 ENCOUNTER — Ambulatory Visit (INDEPENDENT_AMBULATORY_CARE_PROVIDER_SITE_OTHER): Payer: PRIVATE HEALTH INSURANCE | Admitting: Sports Medicine

## 2021-08-07 VITALS — BP 106/74 | Ht 66.0 in | Wt 132.0 lb

## 2021-08-07 DIAGNOSIS — M7139 Other bursal cyst, multiple sites: Secondary | ICD-10-CM | POA: Diagnosis not present

## 2021-08-07 DIAGNOSIS — M6749 Ganglion, multiple sites: Secondary | ICD-10-CM

## 2021-08-07 DIAGNOSIS — M6789 Other specified disorders of synovium and tendon, multiple sites: Secondary | ICD-10-CM

## 2021-08-07 DIAGNOSIS — M76891 Other specified enthesopathies of right lower limb, excluding foot: Secondary | ICD-10-CM

## 2021-08-07 MED ORDER — METHYLPREDNISOLONE ACETATE 40 MG/ML IJ SUSP
40.0000 mg | Freq: Once | INTRAMUSCULAR | Status: AC
  Administered 2021-08-07: 40 mg via INTRA_ARTICULAR

## 2021-08-07 NOTE — Assessment & Plan Note (Signed)
In regards to her proximal hamstring tendinitis, we did discuss the possibility of shockwave therapy, but given that her ganglion cyst is at the most distal insertion of the pes anserine, this may be affecting her proximal hamstrings as well.  After the aspiration and injection today, we will allow her to rest for about a week.  Advised her to call back if she would still like to proceed with shockwave therapy if not making significant strides with her proximal hamstring tightness. ?

## 2021-08-07 NOTE — Assessment & Plan Note (Addendum)
Similar appearance of multiloculated ganglion cyst Pes anserine tendon insertions on the right.  Given patient's soreness in this area as well as cosmetic concern, we discussed the risks and benefits of proceeding with aspiration and injection of the ganglion cyst and she opts to proceed today.  1 cc of Depo-Medrol 40 mg was injected into the area under ultrasound guidance.  We will have her see how this goes over the next week to determine plan per below. ? ?Procedure performed: right pes anserine ganglion cyst aspiration and corticosteroid injection; ultrasound-guided ? ?Consent obtained and verified. ?Time-out conducted. ?Noted no overlying erythema, induration, or other signs of local infection. ?The right pes anserine ganglion cyst was visualized in longitudinal axis and marked.  The overlying skin was prepped in a sterile fashion with chlorhexidine. ?Analgesia: 3cc 1% lidocaine without epinephrine ?Needle: 27g 1.5 inch ?Location: right pes anserine ganglion cyst ?Needle: 18g 1.5 inch ?Needle was introduced into the ganglion cyst with good visualization of needle tip.  Needle was used to manually disrupt septations.  Then 1cc of depo-medrol '40mg'$  was injected into the cyst with good visualization of needle tip and injectate on ultrasound.  Completed without difficulty. ?Meds: depo-medrol '40mg'$  ? ?Compression placed afterwards and advised to continue for remainder of the day. ? ?Advised to call if fevers/chills, erythema, induration, drainage, or persistent bleeding. ? ?

## 2021-08-07 NOTE — Progress Notes (Signed)
? ?Joanne Mccarty is a 56 y.o. female who presents to [facility] today for the following: ? ?Right Proximal hamstring tendinopathy follow-up ?States that she is having slow improvement ?Does continue to feel some tight sensation at the proximal aspect of her hamstrings near the ischial tuberosity ?She is doing her home exercises and states that this is improving ?She is curious if shockwave therapy may be helpful for her condition ? ?Right pes anserine ganglion cyst follow-up ?This was last seen on ultrasound in March ?Stopped wearing the body helix because she thinks it was causing some soreness ?Overall would like this removed today if possible as it is causing her some pain and soreness as well as is a cosmetic bump ? ?PMH reviewed.  ?ROS as above. ?Medications reviewed. ? ?Exam:  ?BP 106/74   Ht '5\' 6"'$  (1.676 m)   Wt 132 lb (59.9 kg)   LMP 08/13/2011   BMI 21.31 kg/m?  ?Gen: Well NAD ?MSK: ? ?Right hip:  ?- Inspection: No gross deformity, no swelling, erythema, or ecchymosis b/l ?- Palpation: There is some mild TTP at ischial tuberosity on right.  None through mid-substance of the hamstrings. ?- ROM: Normal range of motion on Flexion, extension, abduction, internal and external rotation b/l, pain at extreme of hip flexion in proximal hamstrings ?- Strength: Normal strength in all fields b/l, specifically all hamstrings tested as well as hip abduction ?- Neuro/vasc: NV intact distally b/l ?- Special Tests: Negative FABER and FADIR b/l.    ? ?Right Knee: ?- Inspection: There is a swelling at the region of the Pes anserine bursa on the right.  No erythema or bruising b/l. Skin intact ?- Palpation: Mild tenderness to palpation of the region of the pes anserine bursa ?- ROM: full active ROM with flexion and extension in knee and hip b/l ?- Strength: 5/5 strength b/l ?- Neuro/vasc: NV intact distally b/l ?- Special Tests: ?- LIGAMENTS: negative anterior and posterior drawer, no MCL or LCL laxity  ?-- MENISCUS: negative  McMurray's ?-- PF JOINT: 1+ patellar femoral grind ? ?Limited US of right pes anserine shows a small hypoechoic collection of fluid with few septations off the distal insertion of the pes anserine on the medial tibia.   ?Impression: pes anserine ganglion cyst ? ?Ultrasound and interpretation by Wolfgang Phoenix. Oneida Alar, MD and [place] Constable, DO ? ? ?No results found. ? ? ?Assessment and Plan: ?1) Ganglion and cyst of synovium, tendon and bursa ?Similar appearance of multiloculated ganglion cyst Pes anserine tendon insertions on the right.  Given patient's soreness in this area as well as cosmetic concern, we discussed the risks and benefits of proceeding with aspiration and injection of the ganglion cyst and she opts to proceed today.  1 cc of Depo-Medrol 40 mg was injected into the area under ultrasound guidance.  We will have her see how this goes over the next week to determine plan per below. ? ?Procedure performed: right pes anserine ganglion cyst aspiration and corticosteroid injection; ultrasound-guided ? ?Consent obtained and verified. ?Time-out conducted. ?Noted no overlying erythema, induration, or other signs of local infection. ?The right pes anserine ganglion cyst was visualized in longitudinal axis and marked.  The overlying skin was prepped in a sterile fashion with chlorhexidine. ?Analgesia: 3cc 1% lidocaine without epinephrine ?Needle: 27g 1.5 inch ?Location: right pes anserine ganglion cyst ?Needle: 18g 1.5 inch ?Needle was introduced into the ganglion cyst with good visualization of needle tip.  Needle was used to manually disrupt septations.  Then  1cc of depo-medrol '40mg'$  was injected into the cyst with good visualization of needle tip and injectate on ultrasound.  Completed without difficulty. ?Meds: depo-medrol '40mg'$  ? ?Compression placed afterwards and advised to continue for remainder of the day. ? ?Advised to call if fevers/chills, erythema, induration, drainage, or persistent  bleeding. ? ? ?Hamstring tendinitis of right thigh ?In regards to her proximal hamstring tendinitis, we did discuss the possibility of shockwave therapy, but given that her ganglion cyst is at the most distal insertion of the pes anserine, this may be affecting her proximal hamstrings as well.  After the aspiration and injection today, we will allow her to rest for about a week.  Advised her to call back if she would still like to proceed with shockwave therapy if not making significant strides with her proximal hamstring tightness. ? ? ?Arizona Constable, D.O.  ?PGY-4 [facility]  ?08/07/2021 5:32 PM ? ?I observed and examined the patient with the resident and agree with assessment and plan.  Note reviewed and modified by me. ?I observed and documented correct placement of needle for the aspiration/injectin of ganglion of ST tendon at pes anserine. ? ?Ila Mcgill, MD ?

## 2021-12-23 ENCOUNTER — Ambulatory Visit: Payer: 59 | Admitting: Sports Medicine

## 2021-12-23 ENCOUNTER — Ambulatory Visit: Payer: Self-pay

## 2021-12-23 VITALS — BP 110/70 | Ht 66.0 in | Wt 132.0 lb

## 2021-12-23 DIAGNOSIS — M25561 Pain in right knee: Secondary | ICD-10-CM

## 2021-12-23 MED ORDER — METHYLPREDNISOLONE ACETATE 40 MG/ML IJ SUSP
40.0000 mg | Freq: Once | INTRAMUSCULAR | Status: AC
  Administered 2021-12-23: 40 mg via INTRA_ARTICULAR

## 2021-12-24 DIAGNOSIS — M25561 Pain in right knee: Secondary | ICD-10-CM | POA: Insufficient documentation

## 2021-12-24 NOTE — Assessment & Plan Note (Signed)
Patient was found to have an enlarged pes anserine bursa, as seen on ultrasound.  The cyst was aspirated and injected, as detailed below.  She tolerated the procedure well.  Would recommend very light activity for the next 48 hours and then she may continue with her running and pickleball.  She needs to work on hamstring stretching and exercises.  We reviewed with her exercises.  She verbalized understanding.  Patient will follow-up as needed.

## 2021-12-24 NOTE — Progress Notes (Signed)
Established Patient Office Visit  Subjective   Patient ID: Joanne Mccarty, female    DOB: [birth year]  Age: 56 y.o. MRN: [id number]  Bump on right knee.  She presents today with chief complaint of a bump on her right knee and some hamstring tightness.  Of note she has sustained injury of right hamstring tear in the past which gave her similar symptom of this lump in the front of her knee.  She was seen and evaluated determined to have pes anserine cyst 5/23.  At that time the cyst was aspirated and injected with steroid.  She noticed that the bump began to return about 1 month ago and then recently started noticing some hamstring tightness.  This prompted her to come get evaluated as she is trying to avoid any injury to her hamstrings.  She does enjoy running at this time and she is able to complete her normal runs however she is getting this discomfort and tightness.  She takes occasional ibuprofen at the infrequent stretching routine.  She denies any new injury to that area, clicking locking or buckling of that knee.  ROS as listed above in HPI    Objective:     BP 110/70   Ht '5\' 6"'$  (1.676 m)   Wt 132 lb (59.9 kg)   LMP 08/13/2011   BMI 21.31 kg/m   Physical Exam Vitals reviewed.  Constitutional:      General: She is not in acute distress.    Appearance: Normal appearance. She is normal weight. She is not ill-appearing, toxic-appearing or diaphoretic.  Pulmonary:     Effort: Pulmonary effort is normal.  Neurological:     Mental Status: She is alert.   Right knee: No obvious deformity or asymmetry.  She does have a slight area of swelling over her medial anterior shin, near pes anserine.  She has no tenderness to palpation on the medial or lateral joint line.  She has full range of motion with knee flexion and extension.  Stable to varus and valgus stress.  She has no tenderness to palpation about her hamstrings however she does have some discomfort and weakness with resisted knee  flexion. She has equal heights of her medial malleolus as well as her ASIS. Upon gait analysis patient has a slight external rotation of the foot with secondary pronation of the right foot.  Limited ultrasound: Pez anserine bursa  Patient has a large hypoechoic structure measured to be approximately 2 x 1 cm.  There is no neovascularization to the structure.  Impression: Pes anserine bursitis  Ultrasound was performed and interpreted by Joanne Ao B. Oneida Alar, MD and Joanne Keel. Shakala Marlatt, DO   Assessment & Plan:   Problem List Items Addressed This Visit       Other   Acute pain of right knee - Primary    Patient was found to have an enlarged pes anserine bursa, as seen on ultrasound.  The cyst was aspirated and injected, as detailed below.  She tolerated the procedure well.  Would recommend very light activity for the next 48 hours and then she may continue with her running and pickleball.  She needs to work on hamstring stretching and exercises.  We reviewed with her exercises.  She verbalized understanding.  Patient will follow-up as needed.      Relevant Orders   [place] LIMITED JOINT SPACE STRUCTURES LOW RIGHT   After informed written consent timeout was performed, patient was seated on exam table. Right knee was prepped  with alcohol swab and  small wheel was anesthestized with 1.5 cc lidocaine just superficial to pes anserine. Utilizing anterior approach with US guidance, patient's pes anserine bursa was aspirated with 18 gauge needle. About 1cc thick gelatinous clear fluid was aspirated. Through the same needle the bursa was injected with 2:1 lidocaine: depomedrol. Patient tolerated the procedure well without immediate complications.  Return if symptoms worsen or fail to improve.    Joanne Guise, DO  I observed and examined the patient with the resident and agree with assessment and plan.  Note reviewed and modified by me. Joanne Mcgill, MD

## 2022-01-21 ENCOUNTER — Ambulatory Visit: Payer: 59 | Admitting: Sports Medicine

## 2022-01-21 VITALS — BP 108/76 | Ht 66.0 in | Wt 132.0 lb

## 2022-01-21 DIAGNOSIS — M7050 Other bursitis of knee, unspecified knee: Secondary | ICD-10-CM | POA: Diagnosis not present

## 2022-01-21 NOTE — Assessment & Plan Note (Signed)
She continues to have some swelling around this bursa.  It has shrunk in size since last month.  However continues to drain and filled.  Patient should continue to work on running gait especially discontinuation of eversion of her right foot while running.  This is likely secondary to running mechanics.  Recommend compression sleeve or Coban for continued pressure over the area to help decrease reaccumulation.  Patient should follow-up in 4 to 6 weeks if she continues to to have discomfort.  Recommend keeping the area clean, covered and dry.

## 2022-01-21 NOTE — Progress Notes (Signed)
   Established Patient Office Visit  Subjective   Patient ID: Joanne Mccarty, female    DOB: 1966-02-14  Age: 56 y.o. MRN: [id number]  Follow-up right pes anserine bursitis.   She presents today for follow-up on right knee and pes anserine bursitis.  About a month ago Pez anserine bursa was aspirated and injected with corticosteroid injection.  She reports she has had some persistent drainage from the area over the past couple of weeks, most recently about a week ago.  She reports the fluid is clear, jellylike consistency.  She denies any redness or warmth over the area.  She has been working on her running gait careful not to evert.  She has not been having any pain over that area.  She does still experience some hamstring tightness after running.  She was able to complete a 5K this past weekend.  She denies any fevers, chills nausea or vomiting.   ROS as listed above in HPI    Objective:     BP 108/76   Ht '5\' 6"'$  (1.676 m)   Wt 132 lb (59.9 kg)   LMP 08/13/2011   BMI 21.31 kg/m  Physical Exam Vitals reviewed.  Constitutional:      General: She is not in acute distress.    Appearance: Normal appearance. She is normal weight. She is not ill-appearing, toxic-appearing or diaphoretic.  Pulmonary:     Effort: Pulmonary effort is normal.  Neurological:     Mental Status: She is alert.   Right knee: No obvious deformity or asymmetry.  She does have an enlarged palpable, soft fluctuant area on the medial superior tibia, no drainage or tenderness to palpation today.  There is no erythema or warmth over the area.  She has full range of motion of the knee with flexion and extension.  Limited ultrasound: Pes anserine Large hypoechoic structure measured to be approximately 1.5 x 0.5 cm.  No neovascularization is present around the structure. This is smaller in comparison to imaging last month, which measured 2 x 1 cm approximately Impression: Pes anserine bursitis slightly  improved.  Ultrasound was performed and interpreted by Drs. Fields and [employer] & Plan:   Problem List Items Addressed This Visit       Musculoskeletal and Integument   Pes anserine bursitis - Primary    She continues to have some swelling around this bursa.  It has shrunk in size since last month.  However continues to swell.  Patient should continue to work on running gait especially discontinuation of eversion of her right foot while running.  This is likely secondary to running mechanics.  Recommend compression sleeve or Coban for continued pressure over the area to help decrease reaccumulation.  Patient should follow-up in 4 to 6 weeks if she continues to to have discomfort.  Recommend keeping the area clean, covered and dry.       Return in about 4 weeks (around 02/18/2022), or if symptoms worsen or fail to improve.    Elmore Guise, DO  Addendum:  Patient seen in the office by fellow.  Her history, exam, plan of care were precepted with me.  Karlton Lemon MD Kirt Boys

## 2022-06-15 ENCOUNTER — Ambulatory Visit: Payer: 59 | Admitting: Podiatry

## 2022-06-15 ENCOUNTER — Ambulatory Visit (INDEPENDENT_AMBULATORY_CARE_PROVIDER_SITE_OTHER): Payer: 59

## 2022-06-15 DIAGNOSIS — L989 Disorder of the skin and subcutaneous tissue, unspecified: Secondary | ICD-10-CM | POA: Diagnosis not present

## 2022-06-15 DIAGNOSIS — M79672 Pain in left foot: Secondary | ICD-10-CM

## 2022-06-15 NOTE — Progress Notes (Signed)
   Chief Complaint  Patient presents with   Callouses    Bilateral corns 2nd toe, lateral side of the left foot pain     Subjective: 57 y.o. female presenting to the office today for follow-up evaluation of bilateral foot pain.  Patient is very active and runs and plays tennis.  She states that she has developed painful calluses to the bilateral second toes.  She states that a few years back surgery was recommended.  She has tried custom molded orthotics which have not helped alleviate any of her pain.  She presents for further treatment evaluation   Past Medical History:  Diagnosis Date   Abdominal bruit    fem neg doppler exam   Anemia    Asthma    exercise induced-weather - rarely uses inhaler   Galactorrhea 06/21/2010   Headache    History of chickenpox    History of hypertension 1998   resolved   Hx of pyelonephritis 2013   after surgery culture negative    Hypertension 1998   history of HTN- no meds currently   Hypothyroidism    Internal hemorrhoids    Metatarsal stress fracture 07/22/2011   Based on scan and exam 80% likelihood of stress fracture but appears early    PONV (postoperative nausea and vomiting)    Rectal pressure 06/04/2011   while running    Seasonal allergies    SVD (spontaneous vaginal delivery)    x 3     Objective:  Physical Exam General: Alert and oriented x3 in no acute distress  Dermatology: Hyperkeratotic lesion(s) present on the second digits bilateral. Pain on palpation with a central nucleated core noted. Skin is warm, dry and supple bilateral lower extremities. Negative for open lesions or macerations.  Vascular: Palpable pedal pulses bilaterally. No edema or erythema noted. Capillary refill within normal limits.  Neurological: Epicritic and protective threshold grossly intact bilaterally.   Musculoskeletal Exam: No significant pedal deformities noted  Assessment: 1.  InterDigital corn second digit bilateral   Plan of Care:  1.  Patient evaluated.  Continue conservative treatment for now 2. Excisional debridement of keratoic lesion(s) using a chisel blade was performed without incident.  3.  Recommend wide fitting shoes 4.  Silicone toe spacers were provided 5.  Return to clinic as needed  *Husband is Quinnlan Moussa, retired ER doc at [employer], DPM [facility]  Dr. Edrick Kins, DPM    [address], [place] [address]                Office [phone]  Fax [phone]

## 2022-11-26 ENCOUNTER — Ambulatory Visit: Payer: 59 | Admitting: Sports Medicine

## 2022-11-26 ENCOUNTER — Other Ambulatory Visit: Payer: Self-pay

## 2022-11-26 VITALS — BP 102/74 | Ht 66.0 in | Wt 131.0 lb

## 2022-11-26 DIAGNOSIS — G8929 Other chronic pain: Secondary | ICD-10-CM

## 2022-11-26 DIAGNOSIS — M222X1 Patellofemoral disorders, right knee: Secondary | ICD-10-CM

## 2022-11-26 DIAGNOSIS — M25561 Pain in right knee: Secondary | ICD-10-CM | POA: Diagnosis not present

## 2022-11-26 NOTE — Progress Notes (Signed)
PCP: Sigmund Hazel, MD  SUBJECTIVE:   HPI:  Joanne Mccarty is a 57 y.o. female here with chief complaint of right knee pain.  Pain has been ongoing for about a month that she locates it to the medial aspect of her right knee.  She has history of bilateral hamstring tearing as well as pes anserine bursitis on this knee though this feels different as it is along the medial joint line.  She denies any known injury.  She is very active frequently running, biking, playing tennis, and pickleball.  It is exacerbated by her running and playing pickle ball.  She denies any locking or catching sensation in the knee but does note some crepitus of the anterior knee.  ROS:     See HPI  Past Medical History:  Diagnosis Date   Abdominal bruit    fem neg doppler exam   Anemia    Asthma    exercise induced-weather - rarely uses inhaler   Galactorrhea 06/21/2010   Headache    History of chickenpox    History of hypertension 1998   resolved   Hx of pyelonephritis 2013   after surgery culture negative    Hypertension 1998   history of HTN- no meds currently   Hypothyroidism    Internal hemorrhoids    Metatarsal stress fracture 07/22/2011   Based on scan and exam 80% likelihood of stress fracture but appears early    PONV (postoperative nausea and vomiting)    Rectal pressure 06/04/2011   while running    Seasonal allergies    SVD (spontaneous vaginal delivery)    x 3    Current Outpatient Medications on File Prior to Visit  Medication Sig Dispense Refill   docusate sodium (COLACE) 100 MG capsule Take 100 mg by mouth daily as needed for mild constipation.     estradiol (CLIMARA - DOSED IN MG/24 HR) 0.05 mg/24hr patch Place 1 patch onto the skin once a week. Sundays     ondansetron (ZOFRAN ODT) 4 MG disintegrating tablet 4mg  ODT q4 hours prn nausea/vomit (Joanne Mccarty taking differently: Take 4 mg by mouth every 4 (four) hours as needed for nausea or vomiting.) 20 tablet 0   rizatriptan (MAXALT-MLT) 10 MG  disintegrating tablet Take 1 tablet (10 mg total) by mouth as needed for migraine. May repeat in 2 hours if needed 9 tablet 11   SYNTHROID 100 MCG tablet TAKE 1 TABLET BY MOUTH DAILY BEFORE BREAKFAST. 90 tablet 3   zolpidem (AMBIEN) 5 MG tablet Take 5 mg by mouth at bedtime as needed for sleep.     No current facility-administered medications on file prior to visit.    Past Surgical History:  Procedure Laterality Date   ANTERIOR AND POSTERIOR REPAIR N/A 10/02/2015   Procedure: ANTERIOR (CYSTOCELE) REPAIR;  Surgeon: Silverio Lay, MD;  Location: [facility];  Service: Gynecology;  Laterality: N/A;   BLADDER SUSPENSION N/A 10/02/2015   Procedure: TRANSVAGINAL TAPE (TVT) PROCEDURE;  Surgeon: Osborn Coho, MD;  Location: [facility];  Service: Gynecology;  Laterality: N/A;   BREAST SURGERY     left bx   COLONOSCOPY  03/10/2012   Procedure: COLONOSCOPY;  Surgeon: Hart Carwin, MD;  Location: [facility];  Service: Endoscopy;  Laterality: N/A;   CYSTOSCOPY N/A 10/02/2015   Procedure: CYSTOSCOPY;  Surgeon: Osborn Coho, MD;  Location: [facility];  Service: Gynecology;  Laterality: N/A;   DILATION AND CURETTAGE OF UTERUS  12/2007   dysplastic lesion      x 3  4/08   ENDOMETRIAL ABLATION  april 2009   HEMORRHOID BANDING  03/10/2012   Procedure: [facility];  Surgeon: Hart Carwin, MD;  Location: [facility];  Service: Endoscopy;  Laterality: N/A;   LAPAROSCOPIC LYSIS OF ADHESIONS N/A 05/14/2017   Procedure: LAPAROSCOPIC LYSIS OF ADHESIONS;  Surgeon: Silverio Lay, MD;  Location: [facility];  Service: Gynecology;  Laterality: N/A;   LAPAROSCOPY N/A 05/14/2017   Procedure: LAPAROSCOPY OPERATIVE;  Surgeon: Silverio Lay, MD;  Location: [facility];  Service: Gynecology;  Laterality: N/A;   NOVASURE ABLATION     RECTOCELE REPAIR  09/08/2011   Procedure: POSTERIOR REPAIR (RECTOCELE);  Surgeon: Esmeralda Arthur, MD;  Location: [facility];  Service: Gynecology;;   robotic cystectomy  02/2010   VAGINAL HYSTERECTOMY   09/08/2011   Procedure: HYSTERECTOMY VAGINAL;  Surgeon: Esmeralda Arthur, MD;  [facility]: [facility];  Service: Gynecology;  Laterality: N/A;   WISDOM TOOTH EXTRACTION      Allergies  Allergen Reactions   Keflex [Cephalexin] Rash     OBJECTIVE:  BP 102/74   Ht 5\' 6"  (1.676 m)   Wt 131 lb (59.4 kg)   LMP 08/13/2011   BMI 21.14 kg/m   PHYSICAL EXAM:  GEN: Alert and Oriented, NAD, comfortable in exam room RESP: Unlabored respirations, symmetric chest rise PSY: normal mood, congruent affect   Right Knee MSK Exam: No gross deformity, ecchymoses, or erythema. Mild swelling noted at pes anserine bursa. Mild TTP along medial joint line. FROM with normal strength.  Tight hamstrings noted. Negative ant/post drawers. Negative valgus/varus testing. Negative lachman.  Negative mcmurrays, apleys.  Crepitus of the patella with knee flexion extension.  No pain with patellar grind testing. NV intact distally.  Limited MSK U/S of Right Knee:  Medial joint: Well-preserved joint space with normal articular surfaces of femoral condyle as well as  tibia.  Anterior medial meniscus is healthy appearing.  Posterior medial meniscus with hypoechoic changes and area of hyperechoic density.  No splitting of the meniscus noted.  Mild hypoechoic geyser sign noted. Lateral joint: Well-preserved joint space with normal articular surfaces of femoral condyle as well as tibia.  Healthy appearing meniscus. Suprapatellar pouch: No significant effusion noted. Femoral trochlea: Normal-appearing cartilage surface.    Impression: Right medial meniscus contusion without evidence of tearing or degenerative changes to the knee.  U/S performed and interpreted by Glean Salen, MD with supervision of Juluis Rainier, MD.   ASSESSMENT & PLAN:  1. Acute Pain of Right Knee 2. Patellofemoral Disorder of the Right Knee History, exam, and ultrasound findings consistent with medial meniscal contusion.  Reassuringly  normal-appearing joint space, no tearing noted of the meniscus and no knee effusion.  She does also have a component of patellofemoral disorder of the right knee though no significant patellar discomfort and normal articular surface on the ultrasound.  We discussed management with relative rest avoiding hard surfaces and limiting downhill running as this can exacerbate contusion.  Tylenol and NSAIDs for pain as needed, and can consider compression sleeve of the knee in the short-term.    3.  History of hamstring tears and chronic tendinopathy Joanne Mccarty reports a history of prior hamstring tears in both legs with residual pain in the proximal hamstring portion.  We discussed treatment options including extracorporeal shockwave which she is interested in and will follow up to schedule.  Glean Salen, MD PGY-4, Sports Medicine Fellow [facility]  I observed and examined the Joanne Mccarty with the [facility] resident and  agree with assessment and plan.  Note reviewed and modified by me. Sterling Big, MD

## 2023-07-20 ENCOUNTER — Other Ambulatory Visit: Payer: Self-pay

## 2023-07-20 ENCOUNTER — Ambulatory Visit: Attending: Obstetrics and Gynecology | Admitting: Physical Therapy

## 2023-07-20 ENCOUNTER — Encounter: Payer: Self-pay | Admitting: Physical Therapy

## 2023-07-20 DIAGNOSIS — R252 Cramp and spasm: Secondary | ICD-10-CM

## 2023-07-20 DIAGNOSIS — M6281 Muscle weakness (generalized): Secondary | ICD-10-CM
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
## 2023-07-29 ENCOUNTER — Ambulatory Visit: Attending: Obstetrics and Gynecology

## 2023-07-29 DIAGNOSIS — M6281 Muscle weakness (generalized): Secondary | ICD-10-CM

## 2023-07-29 DIAGNOSIS — R252 Cramp and spasm: Secondary | ICD-10-CM | POA: Diagnosis present
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
## 2023-07-30 ENCOUNTER — Ambulatory Visit: Admitting: Physical Therapy

## 2023-07-30 ENCOUNTER — Encounter: Payer: Self-pay | Admitting: Physical Therapy

## 2023-07-30 DIAGNOSIS — R252 Cramp and spasm: Secondary | ICD-10-CM

## 2023-07-30 DIAGNOSIS — M6281 Muscle weakness (generalized): Secondary | ICD-10-CM
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
## 2023-08-10 ENCOUNTER — Ambulatory Visit: Admitting: Family Medicine

## 2023-08-10 ENCOUNTER — Encounter: Payer: Self-pay | Admitting: Family Medicine

## 2023-08-10 ENCOUNTER — Other Ambulatory Visit: Payer: Self-pay

## 2023-08-10 VITALS — BP 118/64 | Ht 66.0 in | Wt 134.0 lb

## 2023-08-10 DIAGNOSIS — M25561 Pain in right knee: Secondary | ICD-10-CM

## 2023-08-10 DIAGNOSIS — M7139 Other bursal cyst, multiple sites: Secondary | ICD-10-CM | POA: Diagnosis not present

## 2023-08-10 DIAGNOSIS — M6789 Other specified disorders of synovium and tendon, multiple sites: Secondary | ICD-10-CM | POA: Diagnosis not present

## 2023-08-10 DIAGNOSIS — M6749 Ganglion, multiple sites: Secondary | ICD-10-CM | POA: Diagnosis not present

## 2023-08-10 MED ORDER — METHYLPREDNISOLONE ACETATE 40 MG/ML IJ SUSP
40.0000 mg | Freq: Once | INTRAMUSCULAR | Status: AC
  Administered 2023-08-10: 40 mg via INTRA_ARTICULAR

## 2023-08-10 NOTE — Patient Instructions (Signed)

## 2023-08-10 NOTE — Assessment & Plan Note (Signed)
 Current ganglion around the right medial knee at the Pes anserine, ultrasound-guided aspiration and injection 12/23/2021 and 08/07/2021.  Good response  Plan: - Previous visit notes reviewed in detail - Appears to be consistent with recurrence of previous issue.  Has done well with prior aspiration injection.  She has upcoming trip to [place] in about a week, will proceed with ultrasound-guided injection again today -Ultrasound-guided right Pes gland cyst aspiration injection completed today as noted below.  Tolerated well - Can continue to use NSAIDs as needed - Compression with activity - Follow-up 4 to 6 weeks if no improvement, sooner as needed.  If persist could consider possible shockwave versus further imaging with MRI  Procedure: right pes anserine ganglion cyst aspiration and corticosteroid injection; ultrasound-guided Date: 08/10/2023 Indication: Knee pain with right pes anserine ganglion cyst  Consent obtained and verified. Time-out conducted. Noted no overlying erythema, induration, or other signs of local infection. The right pes anserine ganglion cyst was visualized in longitudinal axis and marked.  The overlying skin was prepped in a sterile fashion with chlorhexidine . Analgesia: 3cc 1% lidocaine  without epinephrine  injected under ultrasound guidance using 25g 1.5 inch needle After ensuring adequate anesthesia an 18-gauge 1.5 inch needle was introduced into the ganglion cyst with good visualization of needle tip.  Needle was used to manually disrupt septations.  <1cc viscous fluid aspirated.  Was not sent for analysis.  Then solution of 1 cc  depo-medrol  40mg  with 2 cc 1% lidocaine  without epi was injected into the cyst with good visualization of needle tip and injectate on ultrasound.  Completed without difficulty. Area covered with adhesive bandage.  Procedure well without any complications   Advised to call if fevers/chills, erythema, induration, drainage, or persistent  bleeding.

## 2023-08-10 NOTE — Progress Notes (Signed)
 DATE OF VISIT: 08/10/2023        Joanne Mccarty DOB: June 05, 1965 MRN: [id number]  CC:  f/u Rt knee  History of present Illness: Joanne Mccarty is a 58 y.o. female who presents for a follow-up visit for Rt knee pain History recurrent right medial knee pain with recurrent pes bursitis/ganglion Previously seen by Dr. Nolene Baumgarten, last 11/26/2022 Previous ultrasound-guided aspiration/injection of pes bursitis/ganglion 12/23/2021 and 08/07/2021  Was doing well up until recently Having increasing right medial knee pain and swelling over the last several weeks to months Denies any injury or trauma Has been active Worse at night Also worse when sitting for extended periods - Has some upcoming trips to [place] in [place]  - Family to [place] next week with her children as part of graduation in end of the year present Has been using ibuprofen  as needed for other things, not specifically for the knee Has tried brace and sleeve with limited improvement Does note some occasional clicking and popping Denies any locking or instability Denies any increased redness or warmth Denies any fevers, chills, night sweats   Medications:  Outpatient Encounter Medications as of 08/10/2023  Medication Sig   docusate sodium  (COLACE) 100 MG capsule Take 100 mg by mouth daily as needed for mild constipation.   estradiol  (CLIMARA  - DOSED IN MG/24 HR) 0.05 mg/24hr patch Place 1 patch onto the skin once a week. Sundays   ondansetron  (ZOFRAN  ODT) 4 MG disintegrating tablet 4mg  ODT q4 hours prn nausea/vomit (Patient taking differently: Take 4 mg by mouth every 4 (four) hours as needed for nausea or vomiting.)   rizatriptan  (MAXALT -MLT) 10 MG disintegrating tablet Take 1 tablet (10 mg total) by mouth as needed for migraine. May repeat in 2 hours if needed   SYNTHROID  100 MCG tablet TAKE 1 TABLET BY MOUTH DAILY BEFORE BREAKFAST.   zolpidem  (AMBIEN ) 5 MG tablet Take 5 mg by mouth at bedtime as needed for sleep.   [EXPIRED]  methylPREDNISolone  acetate (DEPO-MEDROL ) injection 40 mg    No facility-administered encounter medications on file as of 08/10/2023.    Allergies: is allergic to keflex [cephalexin].  Physical Examination: Vitals: BP 118/64   Ht 5\' 6"  (1.676 m)   Wt 134 lb (60.8 kg)   LMP 08/13/2011   BMI 21.63 kg/m  GENERAL:  Joanne Mccarty is a 58 y.o. female appearing their stated age, alert and oriented x 3, in no apparent distress.  SKIN: no rashes or lesions, skin clean, dry, intact MSK: Knee: Right knee with mild soft tissue swelling over the Pes bursa.  No increased redness or warmth.  Full range of motion of the knee with 1+ crepitus.  Mild medial joint line tenderness, no lateral joint line tenderness.  Negative Lachman, negative varus/valgus stress, negative McMurray. Left knee with full range of motion without pain Neurovascular intact distally  Radiology: Limited MSK ultrasound right knee Date: 08/10/2023 Indication: Right medial knee pain with associated swelling over the pes bursa Findings: -Medial joint line with slight degeneration of the medial meniscus.  No overt tears noted.  Normal-appearing MCL - Pes anserine with hypoechoic, septated fluid collection noted above and below the insertion of the pes.  Most prominent fluid collection on top of the distal pes tendons.  No increased Doppler flow.  Impression: - Septated, loculated ganglion cyst at the Pes anserine  Images and interpretation completed by Marvel Slicker, DO today  Assessment & Plan Ganglion and cyst of synovium, tendon and bursa Current  ganglion around the right medial knee at the Pes anserine, ultrasound-guided aspiration and injection 12/23/2021 and 08/07/2021.  Good response  Plan: - Previous visit notes reviewed in detail - Appears to be consistent with recurrence of previous issue.  Has done well with prior aspiration injection.  She has upcoming trip to [place] in about a week, will proceed with  ultrasound-guided injection again today -Ultrasound-guided right Pes gland cyst aspiration injection completed today as noted below.  Tolerated well - Can continue to use NSAIDs as needed - Compression with activity - Follow-up 4 to 6 weeks if no improvement, sooner as needed.  If persist could consider possible shockwave versus further imaging with MRI  Procedure: right pes anserine ganglion cyst aspiration and corticosteroid injection; ultrasound-guided Date: 08/10/2023 Indication: Knee pain with right pes anserine ganglion cyst  Consent obtained and verified. Time-out conducted. Noted no overlying erythema, induration, or other signs of local infection. The right pes anserine ganglion cyst was visualized in longitudinal axis and marked.  The overlying skin was prepped in a sterile fashion with chlorhexidine . Analgesia: 3cc 1% lidocaine  without epinephrine  injected under ultrasound guidance using 25g 1.5 inch needle After ensuring adequate anesthesia an 18-gauge 1.5 inch needle was introduced into the ganglion cyst with good visualization of needle tip.  Needle was used to manually disrupt septations.  <1cc viscous fluid aspirated.  Was not sent for analysis.  Then solution of 1 cc  depo-medrol  40mg  with 2 cc 1% lidocaine  without epi was injected into the cyst with good visualization of needle tip and injectate on ultrasound.  Completed without difficulty. Area covered with adhesive bandage.  Procedure well without any complications   Advised to call if fevers/chills, erythema, induration, drainage, or persistent bleeding.   Patient expressed understanding & agreement with above.  Encounter Diagnosis  Name Primary?   Acute pain of right knee Yes    Orders Placed This Encounter  Procedures   US  LIMITED JOINT SPACE STRUCTURES LOW RIGHT

## 2023-08-12 ENCOUNTER — Ambulatory Visit

## 2023-08-12 DIAGNOSIS — R252 Cramp and spasm: Secondary | ICD-10-CM

## 2023-08-12 DIAGNOSIS — M6281 Muscle weakness (generalized): Secondary | ICD-10-CM
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
## 2023-09-02 ENCOUNTER — Ambulatory Visit: Attending: Obstetrics and Gynecology

## 2023-09-02 DIAGNOSIS — M6281 Muscle weakness (generalized): Secondary | ICD-10-CM | POA: Insufficient documentation

## 2023-09-02 DIAGNOSIS — R252 Cramp and spasm: Secondary | ICD-10-CM | POA: Insufficient documentation
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
## 2023-09-15 ENCOUNTER — Other Ambulatory Visit: Payer: Self-pay | Admitting: Obstetrics and Gynecology

## 2023-09-15 ENCOUNTER — Encounter: Payer: Self-pay | Admitting: Obstetrics and Gynecology

## 2023-09-15 DIAGNOSIS — Z1231 Encounter for screening mammogram for malignant neoplasm of breast: Secondary | ICD-10-CM

## 2023-09-16 ENCOUNTER — Encounter: Admitting: Physical Therapy

## 2023-09-21 ENCOUNTER — Encounter: Admitting: Physical Therapy

## 2023-09-30 ENCOUNTER — Ambulatory Visit: Payer: Self-pay

## 2023-10-07 ENCOUNTER — Encounter: Admitting: Physical Therapy

## 2023-10-15 ENCOUNTER — Ambulatory Visit: Attending: Obstetrics and Gynecology | Admitting: Physical Therapy

## 2023-10-15 ENCOUNTER — Encounter: Payer: Self-pay | Admitting: Physical Therapy

## 2023-10-15 DIAGNOSIS — R252 Cramp and spasm: Secondary | ICD-10-CM | POA: Diagnosis present

## 2023-10-15 DIAGNOSIS — M6281 Muscle weakness (generalized): Secondary | ICD-10-CM | POA: Insufficient documentation
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
## 2023-10-18 ENCOUNTER — Encounter: Admitting: Physical Therapy

## 2023-10-28 ENCOUNTER — Ambulatory Visit: Admitting: Physical Therapy

## 2023-10-28 ENCOUNTER — Encounter: Payer: Self-pay | Admitting: Physical Therapy

## 2023-10-28 DIAGNOSIS — R252 Cramp and spasm: Secondary | ICD-10-CM | POA: Diagnosis not present

## 2023-10-28 DIAGNOSIS — M6281 Muscle weakness (generalized): Secondary | ICD-10-CM
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
## 2023-11-04 ENCOUNTER — Ambulatory Visit
Admission: RE | Admit: 2023-11-04 | Discharge: 2023-11-04 | Disposition: A | Discharge status: Home / Self Care | Source: Ambulatory Visit | Attending: Obstetrics and Gynecology | Admitting: Obstetrics and Gynecology

## 2023-11-04 DIAGNOSIS — Z1231 Encounter for screening mammogram for malignant neoplasm of breast: Secondary | ICD-10-CM

## 2023-11-18 ENCOUNTER — Ambulatory Visit: Admitting: Physical Therapy

## 2023-11-30 ENCOUNTER — Encounter

## 2023-12-13 ENCOUNTER — Encounter: Admitting: Physical Therapy

## 2023-12-15 ENCOUNTER — Ambulatory Visit: Attending: Obstetrics and Gynecology | Admitting: Physical Therapy

## 2024-01-03 ENCOUNTER — Ambulatory Visit: Admitting: Podiatry

## 2024-01-27 ENCOUNTER — Ambulatory Visit: Admitting: Physical Therapy

## 2024-01-31 ENCOUNTER — Ambulatory Visit: Attending: Obstetrics and Gynecology | Admitting: Physical Therapy

## 2024-01-31 ENCOUNTER — Encounter: Payer: Self-pay | Admitting: Physical Therapy

## 2024-01-31 DIAGNOSIS — R252 Cramp and spasm: Secondary | ICD-10-CM | POA: Diagnosis present

## 2024-01-31 DIAGNOSIS — M6281 Muscle weakness (generalized): Secondary | ICD-10-CM | POA: Diagnosis present
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
## 2024-02-15 ENCOUNTER — Ambulatory Visit

## 2024-02-17 ENCOUNTER — Ambulatory Visit

## 2024-02-17 DIAGNOSIS — R252 Cramp and spasm: Secondary | ICD-10-CM

## 2024-02-17 DIAGNOSIS — M6281 Muscle weakness (generalized): Secondary | ICD-10-CM
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
## 2024-03-02 ENCOUNTER — Ambulatory Visit: Admitting: Physical Therapy

## 2024-03-09 ENCOUNTER — Ambulatory Visit

## 2024-03-16 ENCOUNTER — Encounter: Payer: Self-pay | Admitting: Physical Therapy

## 2024-03-16 ENCOUNTER — Ambulatory Visit: Admitting: Physical Therapy

## 2024-03-16 DIAGNOSIS — M6281 Muscle weakness (generalized): Secondary | ICD-10-CM | POA: Diagnosis present

## 2024-03-16 DIAGNOSIS — R252 Cramp and spasm: Secondary | ICD-10-CM | POA: Diagnosis present
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
